# Patient Record
Sex: Male | Born: 1945
Health system: Southern US, Community
[De-identification: ages and names within clinical notes are randomized; demographics above are authoritative.]

## PROBLEM LIST (undated history)

## (undated) DIAGNOSIS — E871 Hypo-osmolality and hyponatremia: Secondary | ICD-10-CM

## (undated) DIAGNOSIS — R51 Headache: Secondary | ICD-10-CM

## (undated) DIAGNOSIS — Z955 Presence of coronary angioplasty implant and graft: Secondary | ICD-10-CM

## (undated) DIAGNOSIS — K219 Gastro-esophageal reflux disease without esophagitis: Secondary | ICD-10-CM

## (undated) DIAGNOSIS — I341 Nonrheumatic mitral (valve) prolapse: Secondary | ICD-10-CM

## (undated) DIAGNOSIS — J309 Allergic rhinitis, unspecified: Secondary | ICD-10-CM

## (undated) DIAGNOSIS — E119 Type 2 diabetes mellitus without complications: Secondary | ICD-10-CM

## (undated) DIAGNOSIS — E785 Hyperlipidemia, unspecified: Secondary | ICD-10-CM

## (undated) DIAGNOSIS — D696 Thrombocytopenia, unspecified: Secondary | ICD-10-CM

## (undated) DIAGNOSIS — K625 Hemorrhage of anus and rectum: Secondary | ICD-10-CM

## (undated) DIAGNOSIS — K649 Unspecified hemorrhoids: Secondary | ICD-10-CM

## (undated) DIAGNOSIS — Z85048 Personal history of other malignant neoplasm of rectum, rectosigmoid junction, and anus: Secondary | ICD-10-CM

## (undated) DIAGNOSIS — C801 Malignant (primary) neoplasm, unspecified: Secondary | ICD-10-CM

## (undated) DIAGNOSIS — I10 Essential (primary) hypertension: Secondary | ICD-10-CM

## (undated) DIAGNOSIS — R519 Headache, unspecified: Secondary | ICD-10-CM

## (undated) HISTORY — DX: Personal history of other malignant neoplasm of rectum, rectosigmoid junction, and anus: Z85.048

## (undated) HISTORY — DX: Hypo-osmolality and hyponatremia: E87.1

## (undated) HISTORY — DX: Thrombocytopenia, unspecified: D69.6

## (undated) HISTORY — DX: Hyperlipidemia, unspecified: E78.5

## (undated) HISTORY — DX: Unspecified hemorrhoids: K64.9

## (undated) HISTORY — DX: Presence of coronary angioplasty implant and graft: Z95.5

## (undated) HISTORY — PX: OTHER SURGICAL HISTORY: SHX169

## (undated) HISTORY — DX: Allergic rhinitis, unspecified: J30.9

## (undated) HISTORY — DX: Hemorrhage of anus and rectum: K62.5

## (undated) NOTE — *Deleted (*Deleted)
PROGRESS NOTE    Dakota Bennett  JWJ:[id number] DOB: [birth year] DOA: 10/14/2020 PCP: Buckner Malta, MD   Brief Narrative: 17 year old with past medical history significant for rectal cancer with lung mets, hypertension, hyperlipidemia, diabetes type 2 GERD, hemorrhoids, mitral valve prolapse, CAD with a stent on Plavix, chronic diastolic heart failure, chronic thrombocytopenia who presents to the ED complaining of bright red blood per rectum that started the morning of admission.  Patient noticed large amount of bright red blood with dark blood clot mixed and.  He had 5 episode of rectal bleeding throughout the day which prompted him to the ED.  Reports mild dizziness.  Patient admitted with GI bleed, GI has been consulted.  Overnight he continued to have bloody bowel movement, hemoglobin dropped to 6.  He is receiving 2 units of packed red blood cell. transfer to progressive care unit    Assessment & Plan:   Principal Problem:   Rectal bleeding Active Problems:   History of rectal cancer   Diabetes mellitus type 2, noninsulin dependent (HCC)   Hyperlipidemia LDL goal <70   Acute on chronic blood loss anemia   Acute lower GI bleeding  1-Acute lower GI bleed, rectal bleeding: -Patient hemoglobin decreased to 6, presented with bloody stool.  -Received  2 units of packed red blood cell 11/24. -Continue to hold Plavix -plan for sigmoidoscopy this am.  -hb stable at 9.   2-Metastatic rectal cancer with mets to colon 2018 -Follows in [place]. -CT abdomen and pelvis 08/2020 mild progression of pulmonary mets, new left mediastinal lymph node enlargement of the left lower lung nodule.  No recurrence of metastatic cancer in abdomen and pelvis Need to follow-up with primary oncologist  3-Diabetes type II: Hemoglobin A1c 8.7 Continue with a sliding scale insulin  4-Hypertension: Holding blood pressure medication in the setting of GI bleed. As needed labetalol ordered   5-Thrombocytopenia: Chronic, remain stable.   6-CAD status post stenting: Holding Plavix due to GI bleed.  7-Chronic diastolic heart failure: Holding Lasix due to GI bleed.        Estimated body mass index is 25.56 kg/m as calculated from the following:   Height as of this encounter: 5\' 10"  (1.778 m).   Weight as of this encounter: 80.8 kg.   DVT prophylaxis: SCDs Code Status: Full code Family Communication: Care discussed with patient and wife who was at bedside.  Disposition Plan:  Status is: Inpatient  Remains inpatient appropriate because:Hemodynamically unstable   Dispo: The patient is from: Home              Anticipated d/c is to: Home              Anticipated d/c date is: 2 days              Patient currently is not medically stable to d/c.        Consultants:   GI  Procedures:   None  Antimicrobials:  None  Subjective: No more bloody bowel movement since yesterday morning.  Denies abdominal pain.   Objective: Vitals:   10/17/20 0847 10/17/20 0940 10/17/20 0949 10/17/20 0959  BP: (!) 178/63 (!) 129/53 (!) 149/58 (!) 161/56  Pulse:  63 68 64  Resp:  13 11 12   Temp:  97.8 F (36.6 C)    TempSrc:  Axillary    SpO2:  96% 97% 97%  Weight:      Height:        Intake/Output Summary (Last 24 hours)  at 10/17/2020 1007 Last data filed at 10/17/2020 0935 Gross per 24 hour  Intake 1365 ml  Output -  Net 1365 ml   Filed Weights   10/14/20 1920 10/17/20 0652  Weight: 81.6 kg 80.8 kg    Examination:  General exam: NAD Respiratory system: CTA Cardiovascular system: S 1, S 2 RRR Gastrointestinal system: BS present, soft, nt Central nervous system: Alert Extremities: symmetric power.     Data Reviewed: I have personally reviewed following labs and imaging studies  CBC: Recent Labs  Lab 10/14/20 2000 10/14/20 2000 10/15/20 0226 10/15/20 0226 10/15/20 0554 10/15/20 0554 10/15/20 1228 10/16/20 0315 10/16/20 1225 10/16/20 2217  10/17/20 0706  WBC 7.1   < > 7.0  --  7.1  --  5.2 5.7  --   --  5.4  NEUTROABS 5.2  --   --   --   --   --   --   --   --   --   --   HGB 8.5*   < > 8.5*   < > 8.5*   < > 8.0* 6.8* 8.5* 8.9* 9.0*  HCT 28.9*   < > 28.1*   < > 27.6*   < > 25.4* 22.4* 27.3* 27.7* 28.5*  MCV 82.8   < > 80.7  --  79.1*  --  80.1 81.5  --   --  81.4  PLT 105*   < > 100*  --  100*  --  91* 88*  --   --  98*   < > = values in this interval not displayed.   Basic Metabolic Panel: Recent Labs  Lab 10/14/20 2000 10/15/20 1228 10/16/20 0315 10/17/20 0706  NA 138 138 137 139  K 3.5 3.7 3.5 3.6  CL 106 104 103 107  CO2 23 23 22 24   GLUCOSE 226* 259* 178* 142*  BUN 9 10 10  6*  CREATININE 0.77 0.83 0.82 0.80  CALCIUM 7.5* 8.2* 8.0* 8.3*  MG  --   --  1.7  --    GFR: Estimated Creatinine Clearance: 86.2 mL/min (by C-G formula based on SCr of 0.8 mg/dL). Liver Function Tests: Recent Labs  Lab 10/14/20 2000  AST 35  ALT 41  ALKPHOS 122  BILITOT 0.6  PROT 5.3*  ALBUMIN 2.8*   No results for input(s): LIPASE, AMYLASE in the last 168 hours. No results for input(s): AMMONIA in the last 168 hours. Coagulation Profile: Recent Labs  Lab 10/14/20 2000 10/15/20 1228  INR 1.7* 1.2   Cardiac Enzymes: No results for input(s): CKTOTAL, CKMB, CKMBINDEX, TROPONINI in the last 168 hours. BNP (last 3 results) No results for input(s): PROBNP in the last 8760 hours. HbA1C: Recent Labs    10/15/20 0226  HGBA1C 8.7*   CBG: Recent Labs  Lab 10/16/20 1204 10/16/20 1622 10/16/20 2050 10/17/20 0132 10/17/20 0740  GLUCAP 222* 104* 107* 98 135*   Lipid Profile: No results for input(s): CHOL, HDL, LDLCALC, TRIG, CHOLHDL, LDLDIRECT in the last 72 hours. Thyroid Function Tests: No results for input(s): TSH, T4TOTAL, FREET4, T3FREE, THYROIDAB in the last 72 hours. Anemia Panel: No results for input(s): VITAMINB12, FOLATE, FERRITIN, TIBC, IRON, RETICCTPCT in the last 72 hours. Sepsis Labs: No results for  input(s): PROCALCITON, LATICACIDVEN in the last 168 hours.  Recent Results (from the past 240 hour(s))  Respiratory Panel by RT PCR (Flu A&B, Covid) - Nasopharyngeal Swab     Status: None   Collection Time: 10/15/20  3:14 PM  Specimen: Nasopharyngeal Swab; Nasopharyngeal(NP) swabs in vial transport medium  Result Value Ref Range Status   SARS Coronavirus 2 by RT PCR NEGATIVE NEGATIVE Final    Comment: (NOTE) SARS-CoV-2 target nucleic acids are NOT DETECTED.  The SARS-CoV-2 RNA is generally detectable in upper respiratoy specimens during the acute phase of infection. The lowest concentration of SARS-CoV-2 viral copies this assay can detect is 131 copies/mL. A negative result does not preclude SARS-Cov-2 infection and should not be used as the sole basis for treatment or other patient management decisions. A negative result may occur with  improper specimen collection/handling, submission of specimen other than nasopharyngeal swab, presence of viral mutation(s) within the areas targeted by this assay, and inadequate number of viral copies (<131 copies/mL). A negative result must be combined with clinical observations, patient history, and epidemiological information. The expected result is Negative.  Fact Sheet for Patients:  https://www.moore.com/  Fact Sheet for Healthcare Providers:  https://www.young.biz/  This test is no t yet approved or cleared by the [place] FDA and  has been authorized for detection and/or diagnosis of SARS-CoV-2 by FDA under an Emergency Use Authorization (EUA). This EUA will remain  in effect (meaning this test can be used) for the duration of the COVID-19 declaration under Section 564(b)(1) of the Act, 21 U.S.C. section 360bbb-3(b)(1), unless the authorization is terminated or revoked sooner.     Influenza A by PCR NEGATIVE NEGATIVE Final   Influenza B by PCR NEGATIVE NEGATIVE Final    Comment: (NOTE)  The Xpert Xpress SARS-CoV-2/FLU/RSV assay is intended as an aid in  the diagnosis of influenza from Nasopharyngeal swab specimens and  should not be used as a sole basis for treatment. Nasal washings and  aspirates are unacceptable for Xpert Xpress SARS-CoV-2/FLU/RSV  testing.  Fact Sheet for Patients: https://www.moore.com/  Fact Sheet for Healthcare Providers: https://www.young.biz/  This test is not yet approved or cleared by the [place] FDA and  has been authorized for detection and/or diagnosis of SARS-CoV-2 by  FDA under an Emergency Use Authorization (EUA). This EUA will remain  in effect (meaning this test can be used) for the duration of the  Covid-19 declaration under Section 564(b)(1) of the Act, 21  U.S.C. section 360bbb-3(b)(1), unless the authorization is  terminated or revoked. Performed at [facility], [address], [place], [place] [address]          Radiology Studies: No results found.      Scheduled Meds: . atorvastatin  80 mg Oral q1800  . Chlorhexidine Gluconate Cloth  6 each Topical Daily  . ezetimibe  10 mg Oral Daily  . gabapentin  300 mg Oral QHS  . hydrALAZINE  25 mg Oral Daily  . insulin aspart  0-9 Units Subcutaneous TID WC  . isosorbide mononitrate  30 mg Oral Daily  . [START ON 10/18/2020] metoprolol succinate  100 mg Oral Daily  . pantoprazole  40 mg Oral Q0600  . polyethylene glycol  17 g Oral Daily  . sodium chloride flush  10-40 mL Intracatheter Q12H   Continuous Infusions:   LOS: 2 days    Time spent: 35 minutes.     Alba Cory, MD [facility]   If 7PM-7AM, please contact night-coverage [email or web address]  10/17/2020, 10:07 AM

---

## 2014-03-08 DIAGNOSIS — I10 Essential (primary) hypertension: Secondary | ICD-10-CM | POA: Diagnosis not present

## 2014-03-08 DIAGNOSIS — E7800 Pure hypercholesterolemia, unspecified: Secondary | ICD-10-CM | POA: Diagnosis not present

## 2014-03-08 DIAGNOSIS — R42 Dizziness and giddiness: Secondary | ICD-10-CM | POA: Diagnosis not present

## 2014-03-08 DIAGNOSIS — Z91199 Patient's noncompliance with other medical treatment and regimen due to unspecified reason: Secondary | ICD-10-CM | POA: Diagnosis not present

## 2014-03-08 DIAGNOSIS — Z9119 Patient's noncompliance with other medical treatment and regimen: Secondary | ICD-10-CM | POA: Diagnosis not present

## 2014-03-16 DIAGNOSIS — I10 Essential (primary) hypertension: Secondary | ICD-10-CM | POA: Diagnosis not present

## 2014-03-30 DIAGNOSIS — I10 Essential (primary) hypertension: Secondary | ICD-10-CM | POA: Diagnosis not present

## 2014-03-30 DIAGNOSIS — E785 Hyperlipidemia, unspecified: Secondary | ICD-10-CM | POA: Diagnosis not present

## 2014-03-30 DIAGNOSIS — J309 Allergic rhinitis, unspecified: Secondary | ICD-10-CM | POA: Diagnosis not present

## 2014-05-04 DIAGNOSIS — J309 Allergic rhinitis, unspecified: Secondary | ICD-10-CM | POA: Diagnosis not present

## 2014-05-04 DIAGNOSIS — Z23 Encounter for immunization: Secondary | ICD-10-CM | POA: Diagnosis not present

## 2014-05-04 DIAGNOSIS — Z1211 Encounter for screening for malignant neoplasm of colon: Secondary | ICD-10-CM | POA: Diagnosis not present

## 2014-05-04 DIAGNOSIS — I10 Essential (primary) hypertension: Secondary | ICD-10-CM | POA: Diagnosis not present

## 2014-05-04 DIAGNOSIS — E785 Hyperlipidemia, unspecified: Secondary | ICD-10-CM | POA: Diagnosis not present

## 2014-05-10 DIAGNOSIS — Z1211 Encounter for screening for malignant neoplasm of colon: Secondary | ICD-10-CM | POA: Diagnosis not present

## 2014-05-23 DIAGNOSIS — Z0000 Encounter for general adult medical examination without abnormal findings: Secondary | ICD-10-CM | POA: Diagnosis not present

## 2014-05-24 DIAGNOSIS — M949 Disorder of cartilage, unspecified: Secondary | ICD-10-CM | POA: Diagnosis not present

## 2014-05-24 DIAGNOSIS — I10 Essential (primary) hypertension: Secondary | ICD-10-CM | POA: Diagnosis not present

## 2014-05-24 DIAGNOSIS — Z125 Encounter for screening for malignant neoplasm of prostate: Secondary | ICD-10-CM | POA: Diagnosis not present

## 2014-05-24 DIAGNOSIS — E785 Hyperlipidemia, unspecified: Secondary | ICD-10-CM | POA: Diagnosis not present

## 2014-05-24 DIAGNOSIS — Z79899 Other long term (current) drug therapy: Secondary | ICD-10-CM | POA: Diagnosis not present

## 2014-05-24 DIAGNOSIS — M899 Disorder of bone, unspecified: Secondary | ICD-10-CM | POA: Diagnosis not present

## 2014-06-01 DIAGNOSIS — Z136 Encounter for screening for cardiovascular disorders: Secondary | ICD-10-CM | POA: Diagnosis not present

## 2014-07-10 DIAGNOSIS — L989 Disorder of the skin and subcutaneous tissue, unspecified: Secondary | ICD-10-CM | POA: Diagnosis not present

## 2014-07-10 DIAGNOSIS — L259 Unspecified contact dermatitis, unspecified cause: Secondary | ICD-10-CM | POA: Diagnosis not present

## 2014-07-10 DIAGNOSIS — T148 Other injury of unspecified body region: Secondary | ICD-10-CM | POA: Diagnosis not present

## 2014-10-08 DIAGNOSIS — Z79899 Other long term (current) drug therapy: Secondary | ICD-10-CM | POA: Diagnosis not present

## 2014-10-08 DIAGNOSIS — K921 Melena: Secondary | ICD-10-CM | POA: Diagnosis not present

## 2014-10-08 DIAGNOSIS — J302 Other seasonal allergic rhinitis: Secondary | ICD-10-CM | POA: Diagnosis not present

## 2014-10-08 DIAGNOSIS — E780 Pure hypercholesterolemia: Secondary | ICD-10-CM | POA: Diagnosis not present

## 2014-10-08 DIAGNOSIS — I10 Essential (primary) hypertension: Secondary | ICD-10-CM | POA: Diagnosis not present

## 2015-02-04 DIAGNOSIS — E1165 Type 2 diabetes mellitus with hyperglycemia: Secondary | ICD-10-CM | POA: Diagnosis not present

## 2015-02-04 DIAGNOSIS — I10 Essential (primary) hypertension: Secondary | ICD-10-CM | POA: Diagnosis not present

## 2015-02-04 DIAGNOSIS — E780 Pure hypercholesterolemia: Secondary | ICD-10-CM | POA: Diagnosis not present

## 2015-02-04 DIAGNOSIS — Z1389 Encounter for screening for other disorder: Secondary | ICD-10-CM | POA: Diagnosis not present

## 2015-11-04 DIAGNOSIS — J301 Allergic rhinitis due to pollen: Secondary | ICD-10-CM | POA: Diagnosis not present

## 2015-11-04 DIAGNOSIS — E1165 Type 2 diabetes mellitus with hyperglycemia: Secondary | ICD-10-CM | POA: Diagnosis not present

## 2015-11-04 DIAGNOSIS — Z23 Encounter for immunization: Secondary | ICD-10-CM | POA: Diagnosis not present

## 2015-11-04 DIAGNOSIS — I10 Essential (primary) hypertension: Secondary | ICD-10-CM | POA: Diagnosis not present

## 2015-11-04 DIAGNOSIS — Z79899 Other long term (current) drug therapy: Secondary | ICD-10-CM | POA: Diagnosis not present

## 2015-11-04 DIAGNOSIS — E785 Hyperlipidemia, unspecified: Secondary | ICD-10-CM | POA: Diagnosis not present

## 2015-11-04 DIAGNOSIS — Z0000 Encounter for general adult medical examination without abnormal findings: Secondary | ICD-10-CM | POA: Diagnosis not present

## 2016-06-05 DIAGNOSIS — E785 Hyperlipidemia, unspecified: Secondary | ICD-10-CM | POA: Diagnosis not present

## 2016-06-05 DIAGNOSIS — Z79899 Other long term (current) drug therapy: Secondary | ICD-10-CM | POA: Diagnosis not present

## 2016-06-05 DIAGNOSIS — J309 Allergic rhinitis, unspecified: Secondary | ICD-10-CM | POA: Diagnosis not present

## 2016-06-05 DIAGNOSIS — E1165 Type 2 diabetes mellitus with hyperglycemia: Secondary | ICD-10-CM | POA: Diagnosis not present

## 2016-06-05 DIAGNOSIS — R0982 Postnasal drip: Secondary | ICD-10-CM | POA: Diagnosis not present

## 2016-06-05 DIAGNOSIS — I10 Essential (primary) hypertension: Secondary | ICD-10-CM | POA: Diagnosis not present

## 2016-12-07 DIAGNOSIS — R0982 Postnasal drip: Secondary | ICD-10-CM | POA: Diagnosis not present

## 2016-12-07 DIAGNOSIS — E785 Hyperlipidemia, unspecified: Secondary | ICD-10-CM | POA: Diagnosis not present

## 2016-12-07 DIAGNOSIS — Z23 Encounter for immunization: Secondary | ICD-10-CM | POA: Diagnosis not present

## 2016-12-07 DIAGNOSIS — Z125 Encounter for screening for malignant neoplasm of prostate: Secondary | ICD-10-CM | POA: Diagnosis not present

## 2016-12-07 DIAGNOSIS — Z0000 Encounter for general adult medical examination without abnormal findings: Secondary | ICD-10-CM | POA: Diagnosis not present

## 2016-12-07 DIAGNOSIS — Z79899 Other long term (current) drug therapy: Secondary | ICD-10-CM | POA: Diagnosis not present

## 2016-12-07 DIAGNOSIS — J309 Allergic rhinitis, unspecified: Secondary | ICD-10-CM | POA: Diagnosis not present

## 2016-12-07 DIAGNOSIS — E1165 Type 2 diabetes mellitus with hyperglycemia: Secondary | ICD-10-CM | POA: Diagnosis not present

## 2016-12-27 DIAGNOSIS — Z1211 Encounter for screening for malignant neoplasm of colon: Secondary | ICD-10-CM | POA: Diagnosis not present

## 2016-12-27 DIAGNOSIS — Z1212 Encounter for screening for malignant neoplasm of rectum: Secondary | ICD-10-CM | POA: Diagnosis not present

## 2017-02-18 DIAGNOSIS — R195 Other fecal abnormalities: Secondary | ICD-10-CM | POA: Diagnosis not present

## 2017-03-03 DIAGNOSIS — Z7984 Long term (current) use of oral hypoglycemic drugs: Secondary | ICD-10-CM | POA: Diagnosis not present

## 2017-03-03 DIAGNOSIS — Z79899 Other long term (current) drug therapy: Secondary | ICD-10-CM | POA: Diagnosis not present

## 2017-03-03 DIAGNOSIS — C20 Malignant neoplasm of rectum: Secondary | ICD-10-CM | POA: Diagnosis not present

## 2017-03-03 DIAGNOSIS — Z87891 Personal history of nicotine dependence: Secondary | ICD-10-CM | POA: Diagnosis not present

## 2017-03-03 DIAGNOSIS — E785 Hyperlipidemia, unspecified: Secondary | ICD-10-CM | POA: Diagnosis not present

## 2017-03-03 DIAGNOSIS — I10 Essential (primary) hypertension: Secondary | ICD-10-CM | POA: Diagnosis not present

## 2017-03-03 DIAGNOSIS — D123 Benign neoplasm of transverse colon: Secondary | ICD-10-CM | POA: Diagnosis not present

## 2017-03-03 DIAGNOSIS — E119 Type 2 diabetes mellitus without complications: Secondary | ICD-10-CM | POA: Diagnosis not present

## 2017-03-03 DIAGNOSIS — K635 Polyp of colon: Secondary | ICD-10-CM | POA: Diagnosis not present

## 2017-03-03 DIAGNOSIS — R195 Other fecal abnormalities: Secondary | ICD-10-CM | POA: Diagnosis not present

## 2017-03-03 DIAGNOSIS — K5730 Diverticulosis of large intestine without perforation or abscess without bleeding: Secondary | ICD-10-CM | POA: Diagnosis not present

## 2017-03-03 DIAGNOSIS — J309 Allergic rhinitis, unspecified: Secondary | ICD-10-CM | POA: Diagnosis not present

## 2017-03-03 HISTORY — PX: COLONOSCOPY: SHX5424

## 2017-03-08 DIAGNOSIS — R918 Other nonspecific abnormal finding of lung field: Secondary | ICD-10-CM | POA: Diagnosis not present

## 2017-03-08 DIAGNOSIS — C20 Malignant neoplasm of rectum: Secondary | ICD-10-CM | POA: Diagnosis not present

## 2017-03-10 DIAGNOSIS — I10 Essential (primary) hypertension: Secondary | ICD-10-CM | POA: Diagnosis not present

## 2017-03-10 DIAGNOSIS — E119 Type 2 diabetes mellitus without complications: Secondary | ICD-10-CM | POA: Diagnosis not present

## 2017-03-10 DIAGNOSIS — C20 Malignant neoplasm of rectum: Secondary | ICD-10-CM | POA: Diagnosis not present

## 2017-03-11 ENCOUNTER — Telehealth: Payer: Self-pay | Admitting: Gastroenterology

## 2017-03-11 NOTE — Telephone Encounter (Signed)
Received referral from [place] office for patient to have EUS with Dr.Jacobs. Referral placed on Patty's desk.

## 2017-03-11 NOTE — Telephone Encounter (Signed)
Dr Jacobs the records are on your desk for review.  

## 2017-03-15 NOTE — Telephone Encounter (Signed)
Done, in my out box now.

## 2017-03-16 ENCOUNTER — Other Ambulatory Visit: Payer: Self-pay

## 2017-03-16 ENCOUNTER — Telehealth: Payer: Self-pay

## 2017-03-16 DIAGNOSIS — C20 Malignant neoplasm of rectum: Secondary | ICD-10-CM

## 2017-03-16 NOTE — Telephone Encounter (Signed)
Pt has been scheduled for EUS on 03/18/17 EUS scheduled, pt instructed and medications reviewed.  Patient instructions mailed to home.  Patient to call with any questions or concerns.

## 2017-03-17 ENCOUNTER — Encounter (HOSPITAL_COMMUNITY): Payer: Self-pay | Admitting: Certified Registered Nurse Anesthetist

## 2017-03-17 ENCOUNTER — Encounter (HOSPITAL_COMMUNITY): Payer: Self-pay | Admitting: *Deleted

## 2017-03-17 DIAGNOSIS — K760 Fatty (change of) liver, not elsewhere classified: Secondary | ICD-10-CM | POA: Diagnosis not present

## 2017-03-17 DIAGNOSIS — I700 Atherosclerosis of aorta: Secondary | ICD-10-CM | POA: Diagnosis not present

## 2017-03-17 DIAGNOSIS — I2510 Atherosclerotic heart disease of native coronary artery without angina pectoris: Secondary | ICD-10-CM | POA: Diagnosis not present

## 2017-03-17 DIAGNOSIS — R918 Other nonspecific abnormal finding of lung field: Secondary | ICD-10-CM | POA: Diagnosis not present

## 2017-03-17 DIAGNOSIS — C20 Malignant neoplasm of rectum: Secondary | ICD-10-CM | POA: Diagnosis not present

## 2017-03-17 DIAGNOSIS — J439 Emphysema, unspecified: Secondary | ICD-10-CM | POA: Diagnosis not present

## 2017-03-18 ENCOUNTER — Ambulatory Visit (HOSPITAL_COMMUNITY)
Admission: RE | Admit: 2017-03-18 | Discharge: 2017-03-18 | Disposition: A | Discharge status: Home / Self Care | Payer: Medicare Other | Source: Ambulatory Visit | Attending: Gastroenterology | Admitting: Gastroenterology

## 2017-03-18 ENCOUNTER — Encounter (HOSPITAL_COMMUNITY)
Admission: RE | Disposition: A | Discharge status: Home / Self Care | Payer: Self-pay | Source: Ambulatory Visit | Attending: Gastroenterology

## 2017-03-18 ENCOUNTER — Telehealth: Payer: Self-pay

## 2017-03-18 ENCOUNTER — Encounter (HOSPITAL_COMMUNITY): Payer: Self-pay

## 2017-03-18 DIAGNOSIS — Z87891 Personal history of nicotine dependence: Secondary | ICD-10-CM | POA: Insufficient documentation

## 2017-03-18 DIAGNOSIS — E119 Type 2 diabetes mellitus without complications: Secondary | ICD-10-CM | POA: Insufficient documentation

## 2017-03-18 DIAGNOSIS — C20 Malignant neoplasm of rectum: Secondary | ICD-10-CM

## 2017-03-18 DIAGNOSIS — Z886 Allergy status to analgesic agent status: Secondary | ICD-10-CM | POA: Insufficient documentation

## 2017-03-18 DIAGNOSIS — Z91048 Other nonmedicinal substance allergy status: Secondary | ICD-10-CM | POA: Insufficient documentation

## 2017-03-18 DIAGNOSIS — Z881 Allergy status to other antibiotic agents status: Secondary | ICD-10-CM | POA: Insufficient documentation

## 2017-03-18 DIAGNOSIS — I10 Essential (primary) hypertension: Secondary | ICD-10-CM | POA: Insufficient documentation

## 2017-03-18 DIAGNOSIS — K219 Gastro-esophageal reflux disease without esophagitis: Secondary | ICD-10-CM | POA: Insufficient documentation

## 2017-03-18 HISTORY — PX: EUS: SHX5427

## 2017-03-18 HISTORY — DX: Malignant (primary) neoplasm, unspecified: C80.1

## 2017-03-18 HISTORY — DX: Essential (primary) hypertension: I10

## 2017-03-18 HISTORY — DX: Headache: R51

## 2017-03-18 HISTORY — DX: Nonrheumatic mitral (valve) prolapse: I34.1

## 2017-03-18 HISTORY — DX: Gastro-esophageal reflux disease without esophagitis: K21.9

## 2017-03-18 HISTORY — DX: Type 2 diabetes mellitus without complications: E11.9

## 2017-03-18 HISTORY — DX: Headache, unspecified: R51.9

## 2017-03-18 LAB — GLUCOSE, CAPILLARY: GLUCOSE-CAPILLARY: 112 mg/dL — AB (ref 65–99)

## 2017-03-18 SURGERY — ULTRASOUND, LOWER GI TRACT, ENDOSCOPIC
Anesthesia: Moderate Sedation

## 2017-03-18 MED ORDER — MIDAZOLAM HCL 5 MG/ML IJ SOLN
INTRAMUSCULAR | Status: AC
  Filled 2017-03-18: qty 2

## 2017-03-18 MED ORDER — FENTANYL CITRATE (PF) 100 MCG/2ML IJ SOLN
INTRAMUSCULAR | Status: AC
  Filled 2017-03-18: qty 2

## 2017-03-18 MED ORDER — SODIUM CHLORIDE 0.9 % IV SOLN: INTRAVENOUS | Status: DC

## 2017-03-18 NOTE — H&P (Signed)
HPI: This is a 71 yo man  Chief complaint is rectal cancer  Recently diagnosed with non-metastatic rectal adenocaricnoma (Dr. Lyndel Safe colonoscopy). Sent by Dr. Kendell Bane from surgery for EUS staging  ROS: complete GI ROS as described in HPI.  Constitutional:  No unintentional weight loss   Past Medical History:  Diagnosis Date  . Cancer [facility])    rectal cancer  . Diabetes mellitus without complication ([place])    type 2  . GERD (gastroesophageal reflux disease)   . Headache    hx of migraines yrs ago  . Hypertension   . MVP (mitral valve prolapse)    dx 42 yrs no cardiologist    Past Surgical History:  Procedure Laterality Date  . colonscopy  03/03/2017  . extensive dental work    . ingrown toenail removed  age 56 or 13    Current Facility-Administered Medications  Medication Dose Route Frequency Provider Last Rate Last Dose  . 0.9 %  sodium chloride infusion   Intravenous Continuous Milus Banister, MD        Allergies as of 03/16/2017 - Review Complete 03/16/2017  Allergen Reaction Noted  . Aspirin Other (See Comments) 03/16/2017  . Adhesive [tape] Rash 03/16/2017  . Neosporin [neomycin-bacitracin zn-polymyx] Rash 03/16/2017    History reviewed. No pertinent family history.  Social History   Social History  . Marital status: Married    Spouse name: N/A  . Number of children: N/A  . Years of education: N/A   Occupational History  . Not on file.   Social History Main Topics  . Smoking status: Former Smoker    Packs/day: 2.00    Years: 20.00    Types: Cigarettes  . Smokeless tobacco: Former Systems developer    Quit date: 11/23/1998     Comment: quit 2000  . Alcohol use No  . Drug use: No  . Sexual activity: Not on file   Other Topics Concern  . Not on file   Social History Narrative  . No narrative on file     Physical Exam: BP (!) 183/81   Pulse 60   Temp 98 F (36.7 C) (Oral)   Resp 13   SpO2 97%  Constitutional: generally  well-appearing Psychiatric: alert and oriented x3 Abdomen: soft, nontender, nondistended, no obvious ascites, no peritoneal signs, normal bowel sounds No peripheral edema noted in lower extremities  Assessment and plan: 71 y.o. male with rectal adenocarcinoma  For lower EUS staging today  Please see the "Patient Instructions" section for addition details about the plan.  Owens Loffler, MD [facility] 03/18/2017, 1:05 PM

## 2017-03-18 NOTE — Discharge Instructions (Signed)

## 2017-03-18 NOTE — Telephone Encounter (Signed)
-----   Message from Milus Banister, MD sent at 03/18/2017  2:39 PM EDT ----- Marykay Lex, Can you please send copies of todays EUS to Dr Lyndel Safe (gastroenterology in [place]) and also Dr. Kendell Bane ([facility]).   Thanks  dj

## 2017-03-18 NOTE — Op Note (Signed)
[facility] Patient Name: Dakota Bennett Procedure Date: 03/18/2017 MRN: [id number] Attending MD: Milus Banister , MD Date of Birth: February 09, 1946 CSN: [id number] Age: 71 Admit Type: Outpatient Procedure:                Lower EUS Indications:              Recently diagnosed rectal adenocarcinoma; Dr. Lyndel Safe                            colonoscopy; CT scans show no sign of metastatic                            disease. Providers:                Milus Banister, MD, Elmer Ramp. Tilden Dome, RN, Cherylynn Ridges, Technician, Tinnie Gens, Technician Referring MD:             Kendell Bane, MD at [facility] Medicines:                None Complications:            No immediate complications. Estimated blood loss:                            None. Estimated Blood Loss:     Estimated blood loss: none. Procedure:                Pre-Anesthesia Assessment:                           - Prior to the procedure, a History and Physical                            was performed, and patient medications and                            allergies were reviewed. The patient's tolerance of                            previous anesthesia was also reviewed. The risks                            and benefits of the procedure and the sedation                            options and risks were discussed with the patient.                            All questions were answered, and informed consent                            was obtained. Prior Anticoagulants: The patient has  taken no previous anticoagulant or antiplatelet                            agents. ASA Grade Assessment: II - A patient with                            mild systemic disease. After reviewing the risks                            and benefits, the patient was deemed in                            satisfactory condition to undergo the procedure.                           After  obtaining informed consent, the endoscope was                            passed under direct vision. Throughout the                            procedure, the patient's blood pressure, pulse, and                            oxygen saturations were monitored continuously. The                            [id number] ([id number]) scope was introduced through                            the anus and advanced to the the sigmoid colon for                            ultrasound. The lower EUS was accomplished without                            difficulty. The patient tolerated the procedure                            well. The quality of the bowel preparation was good. Findings:      Endoscopic Finding :      1. A clearly malignant non-obstructing medium-sized mass was found       proximal rectum. The mass was partially circumferential (involving       two-thirds of the lumen circumference) and was 3cm long. The distal edge       of the mass was 10cm from the anal verge. The proximal edge of the mass       has previous [place] Ink tatoo present.      Endosonographic Finding :      1. The mass above correlates with a heterogeneous, hypoechoic lesion       that clearly passes into and through the muscularis propria wall (uT3).      2. There were two round, suspicious perirectal lymphnodes that measure 5  and 97mm (uN1). Impression:               - 3cm long, non-circumferential, non-obstructing                            uT3N1 adenocarcinoma with distal edge at 10cm from                            the anal verge. Moderate Sedation:      None Recommendation:           - Discharge patient to home (ambulatory).                           - He will likely benefit from neoadjuvant chemo/XRT. Procedure Code(s):        --- Professional ---                           [phone], Sigmoidoscopy, flexible; with endoscopic                            ultrasound examination Diagnosis Code(s):        --- Professional ---                            C18.7, Malignant neoplasm of sigmoid colon                           K62.89, Other specified diseases of anus and rectum                           C21.8, Malignant neoplasm of overlapping sites of                            rectum, anus and anal canal CPT copyright 2016 American Medical Association. All rights reserved. The codes documented in this report are preliminary and upon coder review may  be revised to meet current compliance requirements. Milus Banister, MD 03/18/2017 2:38:13 PM This report has been signed electronically. Number of Addenda: 0

## 2017-03-18 NOTE — Telephone Encounter (Signed)
EUS faxed as requested

## 2017-03-22 ENCOUNTER — Encounter (HOSPITAL_COMMUNITY): Payer: Self-pay | Admitting: Gastroenterology

## 2017-03-26 DIAGNOSIS — I10 Essential (primary) hypertension: Secondary | ICD-10-CM | POA: Diagnosis not present

## 2017-03-26 DIAGNOSIS — C218 Malignant neoplasm of overlapping sites of rectum, anus and anal canal: Secondary | ICD-10-CM | POA: Diagnosis not present

## 2017-03-26 DIAGNOSIS — R918 Other nonspecific abnormal finding of lung field: Secondary | ICD-10-CM | POA: Diagnosis not present

## 2017-03-26 DIAGNOSIS — J309 Allergic rhinitis, unspecified: Secondary | ICD-10-CM | POA: Diagnosis not present

## 2017-03-26 DIAGNOSIS — E785 Hyperlipidemia, unspecified: Secondary | ICD-10-CM | POA: Diagnosis not present

## 2017-03-26 DIAGNOSIS — E119 Type 2 diabetes mellitus without complications: Secondary | ICD-10-CM | POA: Diagnosis not present

## 2017-03-26 DIAGNOSIS — Z87891 Personal history of nicotine dependence: Secondary | ICD-10-CM | POA: Diagnosis not present

## 2017-03-26 DIAGNOSIS — I341 Nonrheumatic mitral (valve) prolapse: Secondary | ICD-10-CM | POA: Diagnosis not present

## 2017-03-26 DIAGNOSIS — C20 Malignant neoplasm of rectum: Secondary | ICD-10-CM | POA: Diagnosis not present

## 2017-03-29 DIAGNOSIS — C20 Malignant neoplasm of rectum: Secondary | ICD-10-CM

## 2017-03-29 HISTORY — DX: Malignant neoplasm of rectum: C20

## 2017-03-30 DIAGNOSIS — I999 Unspecified disorder of circulatory system: Secondary | ICD-10-CM | POA: Diagnosis not present

## 2017-03-30 DIAGNOSIS — C20 Malignant neoplasm of rectum: Secondary | ICD-10-CM | POA: Diagnosis not present

## 2017-03-30 DIAGNOSIS — Z87891 Personal history of nicotine dependence: Secondary | ICD-10-CM | POA: Diagnosis not present

## 2017-03-30 DIAGNOSIS — Z800 Family history of malignant neoplasm of digestive organs: Secondary | ICD-10-CM | POA: Diagnosis not present

## 2017-03-30 DIAGNOSIS — R9431 Abnormal electrocardiogram [ECG] [EKG]: Secondary | ICD-10-CM | POA: Diagnosis not present

## 2017-03-30 DIAGNOSIS — Z7984 Long term (current) use of oral hypoglycemic drugs: Secondary | ICD-10-CM | POA: Diagnosis not present

## 2017-03-30 DIAGNOSIS — E119 Type 2 diabetes mellitus without complications: Secondary | ICD-10-CM | POA: Diagnosis not present

## 2017-03-30 DIAGNOSIS — E785 Hyperlipidemia, unspecified: Secondary | ICD-10-CM | POA: Diagnosis not present

## 2017-03-30 DIAGNOSIS — I872 Venous insufficiency (chronic) (peripheral): Secondary | ICD-10-CM | POA: Diagnosis not present

## 2017-03-30 DIAGNOSIS — I10 Essential (primary) hypertension: Secondary | ICD-10-CM | POA: Diagnosis not present

## 2017-04-01 DIAGNOSIS — C20 Malignant neoplasm of rectum: Secondary | ICD-10-CM | POA: Diagnosis not present

## 2017-04-01 DIAGNOSIS — C218 Malignant neoplasm of overlapping sites of rectum, anus and anal canal: Secondary | ICD-10-CM | POA: Diagnosis not present

## 2017-04-01 DIAGNOSIS — E119 Type 2 diabetes mellitus without complications: Secondary | ICD-10-CM | POA: Diagnosis not present

## 2017-04-01 DIAGNOSIS — I10 Essential (primary) hypertension: Secondary | ICD-10-CM | POA: Diagnosis not present

## 2017-04-01 DIAGNOSIS — Z87891 Personal history of nicotine dependence: Secondary | ICD-10-CM | POA: Diagnosis not present

## 2017-04-05 DIAGNOSIS — Z510 Encounter for antineoplastic radiation therapy: Secondary | ICD-10-CM | POA: Diagnosis not present

## 2017-04-05 DIAGNOSIS — C218 Malignant neoplasm of overlapping sites of rectum, anus and anal canal: Secondary | ICD-10-CM | POA: Diagnosis not present

## 2017-04-05 DIAGNOSIS — C20 Malignant neoplasm of rectum: Secondary | ICD-10-CM | POA: Diagnosis not present

## 2017-04-07 DIAGNOSIS — C20 Malignant neoplasm of rectum: Secondary | ICD-10-CM | POA: Diagnosis not present

## 2017-04-07 DIAGNOSIS — Z510 Encounter for antineoplastic radiation therapy: Secondary | ICD-10-CM | POA: Diagnosis not present

## 2017-04-08 DIAGNOSIS — C218 Malignant neoplasm of overlapping sites of rectum, anus and anal canal: Secondary | ICD-10-CM | POA: Diagnosis not present

## 2017-04-09 DIAGNOSIS — C218 Malignant neoplasm of overlapping sites of rectum, anus and anal canal: Secondary | ICD-10-CM | POA: Diagnosis not present

## 2017-04-09 DIAGNOSIS — Z510 Encounter for antineoplastic radiation therapy: Secondary | ICD-10-CM | POA: Diagnosis not present

## 2017-04-09 DIAGNOSIS — C20 Malignant neoplasm of rectum: Secondary | ICD-10-CM | POA: Diagnosis not present

## 2017-04-12 DIAGNOSIS — Z510 Encounter for antineoplastic radiation therapy: Secondary | ICD-10-CM | POA: Diagnosis not present

## 2017-04-12 DIAGNOSIS — C218 Malignant neoplasm of overlapping sites of rectum, anus and anal canal: Secondary | ICD-10-CM | POA: Diagnosis not present

## 2017-04-12 DIAGNOSIS — C20 Malignant neoplasm of rectum: Secondary | ICD-10-CM | POA: Diagnosis not present

## 2017-04-13 DIAGNOSIS — C218 Malignant neoplasm of overlapping sites of rectum, anus and anal canal: Secondary | ICD-10-CM | POA: Diagnosis not present

## 2017-04-13 DIAGNOSIS — Z510 Encounter for antineoplastic radiation therapy: Secondary | ICD-10-CM | POA: Diagnosis not present

## 2017-04-13 DIAGNOSIS — C20 Malignant neoplasm of rectum: Secondary | ICD-10-CM | POA: Diagnosis not present

## 2017-04-14 DIAGNOSIS — C20 Malignant neoplasm of rectum: Secondary | ICD-10-CM | POA: Diagnosis not present

## 2017-04-14 DIAGNOSIS — Z510 Encounter for antineoplastic radiation therapy: Secondary | ICD-10-CM | POA: Diagnosis not present

## 2017-04-14 DIAGNOSIS — C218 Malignant neoplasm of overlapping sites of rectum, anus and anal canal: Secondary | ICD-10-CM | POA: Diagnosis not present

## 2017-04-15 DIAGNOSIS — Z510 Encounter for antineoplastic radiation therapy: Secondary | ICD-10-CM | POA: Diagnosis not present

## 2017-04-15 DIAGNOSIS — C20 Malignant neoplasm of rectum: Secondary | ICD-10-CM | POA: Diagnosis not present

## 2017-04-15 DIAGNOSIS — C218 Malignant neoplasm of overlapping sites of rectum, anus and anal canal: Secondary | ICD-10-CM | POA: Diagnosis not present

## 2017-04-16 DIAGNOSIS — C20 Malignant neoplasm of rectum: Secondary | ICD-10-CM | POA: Diagnosis not present

## 2017-04-16 DIAGNOSIS — C218 Malignant neoplasm of overlapping sites of rectum, anus and anal canal: Secondary | ICD-10-CM | POA: Diagnosis not present

## 2017-04-16 DIAGNOSIS — Z510 Encounter for antineoplastic radiation therapy: Secondary | ICD-10-CM | POA: Diagnosis not present

## 2017-04-19 DIAGNOSIS — C218 Malignant neoplasm of overlapping sites of rectum, anus and anal canal: Secondary | ICD-10-CM | POA: Diagnosis not present

## 2017-04-19 DIAGNOSIS — C20 Malignant neoplasm of rectum: Secondary | ICD-10-CM | POA: Diagnosis not present

## 2017-04-20 DIAGNOSIS — C218 Malignant neoplasm of overlapping sites of rectum, anus and anal canal: Secondary | ICD-10-CM | POA: Diagnosis not present

## 2017-04-21 DIAGNOSIS — C218 Malignant neoplasm of overlapping sites of rectum, anus and anal canal: Secondary | ICD-10-CM | POA: Diagnosis not present

## 2017-04-21 DIAGNOSIS — Z510 Encounter for antineoplastic radiation therapy: Secondary | ICD-10-CM | POA: Diagnosis not present

## 2017-04-21 DIAGNOSIS — C20 Malignant neoplasm of rectum: Secondary | ICD-10-CM | POA: Diagnosis not present

## 2017-04-22 DIAGNOSIS — C218 Malignant neoplasm of overlapping sites of rectum, anus and anal canal: Secondary | ICD-10-CM | POA: Diagnosis not present

## 2017-04-22 DIAGNOSIS — Z510 Encounter for antineoplastic radiation therapy: Secondary | ICD-10-CM | POA: Diagnosis not present

## 2017-04-23 DIAGNOSIS — Z510 Encounter for antineoplastic radiation therapy: Secondary | ICD-10-CM | POA: Diagnosis not present

## 2017-04-23 DIAGNOSIS — C218 Malignant neoplasm of overlapping sites of rectum, anus and anal canal: Secondary | ICD-10-CM | POA: Diagnosis not present

## 2017-04-26 DIAGNOSIS — E1165 Type 2 diabetes mellitus with hyperglycemia: Secondary | ICD-10-CM | POA: Diagnosis not present

## 2017-04-26 DIAGNOSIS — J309 Allergic rhinitis, unspecified: Secondary | ICD-10-CM | POA: Diagnosis not present

## 2017-04-26 DIAGNOSIS — C218 Malignant neoplasm of overlapping sites of rectum, anus and anal canal: Secondary | ICD-10-CM | POA: Diagnosis not present

## 2017-04-26 DIAGNOSIS — E7800 Pure hypercholesterolemia, unspecified: Secondary | ICD-10-CM | POA: Diagnosis not present

## 2017-04-26 DIAGNOSIS — I10 Essential (primary) hypertension: Secondary | ICD-10-CM | POA: Diagnosis not present

## 2017-04-27 DIAGNOSIS — Z510 Encounter for antineoplastic radiation therapy: Secondary | ICD-10-CM | POA: Diagnosis not present

## 2017-04-27 DIAGNOSIS — C218 Malignant neoplasm of overlapping sites of rectum, anus and anal canal: Secondary | ICD-10-CM | POA: Diagnosis not present

## 2017-04-28 DIAGNOSIS — Z510 Encounter for antineoplastic radiation therapy: Secondary | ICD-10-CM | POA: Diagnosis not present

## 2017-04-28 DIAGNOSIS — C218 Malignant neoplasm of overlapping sites of rectum, anus and anal canal: Secondary | ICD-10-CM | POA: Diagnosis not present

## 2017-04-29 DIAGNOSIS — Z510 Encounter for antineoplastic radiation therapy: Secondary | ICD-10-CM | POA: Diagnosis not present

## 2017-04-29 DIAGNOSIS — C218 Malignant neoplasm of overlapping sites of rectum, anus and anal canal: Secondary | ICD-10-CM | POA: Diagnosis not present

## 2017-04-30 DIAGNOSIS — Z510 Encounter for antineoplastic radiation therapy: Secondary | ICD-10-CM | POA: Diagnosis not present

## 2017-04-30 DIAGNOSIS — C218 Malignant neoplasm of overlapping sites of rectum, anus and anal canal: Secondary | ICD-10-CM | POA: Diagnosis not present

## 2017-05-03 DIAGNOSIS — Z510 Encounter for antineoplastic radiation therapy: Secondary | ICD-10-CM | POA: Diagnosis not present

## 2017-05-03 DIAGNOSIS — C218 Malignant neoplasm of overlapping sites of rectum, anus and anal canal: Secondary | ICD-10-CM | POA: Diagnosis not present

## 2017-05-04 DIAGNOSIS — C218 Malignant neoplasm of overlapping sites of rectum, anus and anal canal: Secondary | ICD-10-CM | POA: Diagnosis not present

## 2017-05-04 DIAGNOSIS — Z510 Encounter for antineoplastic radiation therapy: Secondary | ICD-10-CM | POA: Diagnosis not present

## 2017-05-05 DIAGNOSIS — Z510 Encounter for antineoplastic radiation therapy: Secondary | ICD-10-CM | POA: Diagnosis not present

## 2017-05-05 DIAGNOSIS — C218 Malignant neoplasm of overlapping sites of rectum, anus and anal canal: Secondary | ICD-10-CM | POA: Diagnosis not present

## 2017-05-06 DIAGNOSIS — Z510 Encounter for antineoplastic radiation therapy: Secondary | ICD-10-CM | POA: Diagnosis not present

## 2017-05-06 DIAGNOSIS — R300 Dysuria: Secondary | ICD-10-CM | POA: Diagnosis not present

## 2017-05-06 DIAGNOSIS — C218 Malignant neoplasm of overlapping sites of rectum, anus and anal canal: Secondary | ICD-10-CM | POA: Diagnosis not present

## 2017-05-07 DIAGNOSIS — C218 Malignant neoplasm of overlapping sites of rectum, anus and anal canal: Secondary | ICD-10-CM | POA: Diagnosis not present

## 2017-05-07 DIAGNOSIS — Z510 Encounter for antineoplastic radiation therapy: Secondary | ICD-10-CM | POA: Diagnosis not present

## 2017-05-10 DIAGNOSIS — C218 Malignant neoplasm of overlapping sites of rectum, anus and anal canal: Secondary | ICD-10-CM | POA: Diagnosis not present

## 2017-05-11 DIAGNOSIS — Z510 Encounter for antineoplastic radiation therapy: Secondary | ICD-10-CM | POA: Diagnosis not present

## 2017-05-11 DIAGNOSIS — C218 Malignant neoplasm of overlapping sites of rectum, anus and anal canal: Secondary | ICD-10-CM | POA: Diagnosis not present

## 2017-05-12 DIAGNOSIS — C218 Malignant neoplasm of overlapping sites of rectum, anus and anal canal: Secondary | ICD-10-CM | POA: Diagnosis not present

## 2017-05-12 DIAGNOSIS — Z510 Encounter for antineoplastic radiation therapy: Secondary | ICD-10-CM | POA: Diagnosis not present

## 2017-05-13 DIAGNOSIS — C218 Malignant neoplasm of overlapping sites of rectum, anus and anal canal: Secondary | ICD-10-CM | POA: Diagnosis not present

## 2017-05-14 DIAGNOSIS — Z510 Encounter for antineoplastic radiation therapy: Secondary | ICD-10-CM | POA: Diagnosis not present

## 2017-05-14 DIAGNOSIS — C218 Malignant neoplasm of overlapping sites of rectum, anus and anal canal: Secondary | ICD-10-CM | POA: Diagnosis not present

## 2017-05-17 DIAGNOSIS — Z510 Encounter for antineoplastic radiation therapy: Secondary | ICD-10-CM | POA: Diagnosis not present

## 2017-05-17 DIAGNOSIS — C218 Malignant neoplasm of overlapping sites of rectum, anus and anal canal: Secondary | ICD-10-CM | POA: Diagnosis not present

## 2017-05-18 DIAGNOSIS — C218 Malignant neoplasm of overlapping sites of rectum, anus and anal canal: Secondary | ICD-10-CM | POA: Diagnosis not present

## 2017-05-18 DIAGNOSIS — Z510 Encounter for antineoplastic radiation therapy: Secondary | ICD-10-CM | POA: Diagnosis not present

## 2017-05-19 DIAGNOSIS — Z510 Encounter for antineoplastic radiation therapy: Secondary | ICD-10-CM | POA: Diagnosis not present

## 2017-05-19 DIAGNOSIS — C218 Malignant neoplasm of overlapping sites of rectum, anus and anal canal: Secondary | ICD-10-CM | POA: Diagnosis not present

## 2017-06-14 DIAGNOSIS — C218 Malignant neoplasm of overlapping sites of rectum, anus and anal canal: Secondary | ICD-10-CM | POA: Diagnosis not present

## 2017-06-25 DIAGNOSIS — C20 Malignant neoplasm of rectum: Secondary | ICD-10-CM | POA: Diagnosis not present

## 2017-06-30 DIAGNOSIS — E119 Type 2 diabetes mellitus without complications: Secondary | ICD-10-CM | POA: Diagnosis not present

## 2017-06-30 DIAGNOSIS — C218 Malignant neoplasm of overlapping sites of rectum, anus and anal canal: Secondary | ICD-10-CM | POA: Diagnosis not present

## 2017-06-30 DIAGNOSIS — I10 Essential (primary) hypertension: Secondary | ICD-10-CM | POA: Diagnosis not present

## 2017-06-30 DIAGNOSIS — J439 Emphysema, unspecified: Secondary | ICD-10-CM | POA: Diagnosis not present

## 2017-06-30 DIAGNOSIS — K4090 Unilateral inguinal hernia, without obstruction or gangrene, not specified as recurrent: Secondary | ICD-10-CM | POA: Diagnosis not present

## 2017-06-30 DIAGNOSIS — I700 Atherosclerosis of aorta: Secondary | ICD-10-CM | POA: Diagnosis not present

## 2017-06-30 DIAGNOSIS — C20 Malignant neoplasm of rectum: Secondary | ICD-10-CM | POA: Diagnosis not present

## 2017-06-30 DIAGNOSIS — R918 Other nonspecific abnormal finding of lung field: Secondary | ICD-10-CM | POA: Diagnosis not present

## 2017-06-30 DIAGNOSIS — K5730 Diverticulosis of large intestine without perforation or abscess without bleeding: Secondary | ICD-10-CM | POA: Diagnosis not present

## 2017-07-02 DIAGNOSIS — R918 Other nonspecific abnormal finding of lung field: Secondary | ICD-10-CM | POA: Diagnosis not present

## 2017-07-02 DIAGNOSIS — C20 Malignant neoplasm of rectum: Secondary | ICD-10-CM | POA: Diagnosis not present

## 2017-07-08 DIAGNOSIS — C7800 Secondary malignant neoplasm of unspecified lung: Secondary | ICD-10-CM | POA: Diagnosis not present

## 2017-07-08 DIAGNOSIS — C218 Malignant neoplasm of overlapping sites of rectum, anus and anal canal: Secondary | ICD-10-CM | POA: Diagnosis not present

## 2017-07-08 DIAGNOSIS — C7802 Secondary malignant neoplasm of left lung: Secondary | ICD-10-CM | POA: Diagnosis not present

## 2017-07-08 DIAGNOSIS — C7801 Secondary malignant neoplasm of right lung: Secondary | ICD-10-CM | POA: Diagnosis not present

## 2017-07-12 DIAGNOSIS — C218 Malignant neoplasm of overlapping sites of rectum, anus and anal canal: Secondary | ICD-10-CM | POA: Diagnosis not present

## 2017-07-12 DIAGNOSIS — Z5111 Encounter for antineoplastic chemotherapy: Secondary | ICD-10-CM | POA: Diagnosis not present

## 2017-07-12 DIAGNOSIS — C7800 Secondary malignant neoplasm of unspecified lung: Secondary | ICD-10-CM | POA: Diagnosis not present

## 2017-07-14 DIAGNOSIS — C218 Malignant neoplasm of overlapping sites of rectum, anus and anal canal: Secondary | ICD-10-CM | POA: Diagnosis not present

## 2017-07-14 DIAGNOSIS — C7800 Secondary malignant neoplasm of unspecified lung: Secondary | ICD-10-CM | POA: Diagnosis not present

## 2017-07-14 DIAGNOSIS — Z452 Encounter for adjustment and management of vascular access device: Secondary | ICD-10-CM | POA: Diagnosis not present

## 2017-07-16 DIAGNOSIS — J309 Allergic rhinitis, unspecified: Secondary | ICD-10-CM | POA: Diagnosis not present

## 2017-07-16 DIAGNOSIS — C20 Malignant neoplasm of rectum: Secondary | ICD-10-CM | POA: Diagnosis not present

## 2017-07-16 DIAGNOSIS — R0982 Postnasal drip: Secondary | ICD-10-CM | POA: Diagnosis not present

## 2017-07-16 DIAGNOSIS — I10 Essential (primary) hypertension: Secondary | ICD-10-CM | POA: Diagnosis not present

## 2017-07-19 DIAGNOSIS — R300 Dysuria: Secondary | ICD-10-CM | POA: Diagnosis not present

## 2017-07-19 DIAGNOSIS — C218 Malignant neoplasm of overlapping sites of rectum, anus and anal canal: Secondary | ICD-10-CM | POA: Diagnosis not present

## 2017-07-21 DIAGNOSIS — C218 Malignant neoplasm of overlapping sites of rectum, anus and anal canal: Secondary | ICD-10-CM | POA: Diagnosis not present

## 2017-07-21 DIAGNOSIS — C7800 Secondary malignant neoplasm of unspecified lung: Secondary | ICD-10-CM | POA: Diagnosis not present

## 2017-07-28 DIAGNOSIS — D696 Thrombocytopenia, unspecified: Secondary | ICD-10-CM | POA: Diagnosis not present

## 2017-07-28 DIAGNOSIS — D6959 Other secondary thrombocytopenia: Secondary | ICD-10-CM | POA: Diagnosis not present

## 2017-07-28 DIAGNOSIS — C7800 Secondary malignant neoplasm of unspecified lung: Secondary | ICD-10-CM | POA: Diagnosis not present

## 2017-07-28 DIAGNOSIS — R9431 Abnormal electrocardiogram [ECG] [EKG]: Secondary | ICD-10-CM | POA: Diagnosis not present

## 2017-07-28 DIAGNOSIS — C218 Malignant neoplasm of overlapping sites of rectum, anus and anal canal: Secondary | ICD-10-CM | POA: Diagnosis not present

## 2017-07-30 DIAGNOSIS — C218 Malignant neoplasm of overlapping sites of rectum, anus and anal canal: Secondary | ICD-10-CM | POA: Diagnosis not present

## 2017-07-30 DIAGNOSIS — Z452 Encounter for adjustment and management of vascular access device: Secondary | ICD-10-CM | POA: Diagnosis not present

## 2017-08-09 DIAGNOSIS — C218 Malignant neoplasm of overlapping sites of rectum, anus and anal canal: Secondary | ICD-10-CM | POA: Diagnosis not present

## 2017-08-09 DIAGNOSIS — C7800 Secondary malignant neoplasm of unspecified lung: Secondary | ICD-10-CM | POA: Diagnosis not present

## 2017-08-09 DIAGNOSIS — Z515 Encounter for palliative care: Secondary | ICD-10-CM | POA: Diagnosis not present

## 2017-08-09 DIAGNOSIS — R0981 Nasal congestion: Secondary | ICD-10-CM | POA: Diagnosis not present

## 2017-08-09 DIAGNOSIS — D696 Thrombocytopenia, unspecified: Secondary | ICD-10-CM | POA: Diagnosis not present

## 2017-08-11 DIAGNOSIS — C218 Malignant neoplasm of overlapping sites of rectum, anus and anal canal: Secondary | ICD-10-CM | POA: Diagnosis not present

## 2017-08-11 DIAGNOSIS — C7800 Secondary malignant neoplasm of unspecified lung: Secondary | ICD-10-CM | POA: Diagnosis not present

## 2017-08-11 DIAGNOSIS — Z452 Encounter for adjustment and management of vascular access device: Secondary | ICD-10-CM | POA: Diagnosis not present

## 2017-08-23 DIAGNOSIS — C7800 Secondary malignant neoplasm of unspecified lung: Secondary | ICD-10-CM | POA: Diagnosis not present

## 2017-08-23 DIAGNOSIS — Z23 Encounter for immunization: Secondary | ICD-10-CM | POA: Diagnosis not present

## 2017-08-23 DIAGNOSIS — C218 Malignant neoplasm of overlapping sites of rectum, anus and anal canal: Secondary | ICD-10-CM | POA: Diagnosis not present

## 2017-08-23 DIAGNOSIS — D6959 Other secondary thrombocytopenia: Secondary | ICD-10-CM | POA: Diagnosis not present

## 2017-08-25 DIAGNOSIS — C7802 Secondary malignant neoplasm of left lung: Secondary | ICD-10-CM | POA: Diagnosis not present

## 2017-08-25 DIAGNOSIS — C218 Malignant neoplasm of overlapping sites of rectum, anus and anal canal: Secondary | ICD-10-CM | POA: Diagnosis not present

## 2017-08-25 DIAGNOSIS — Z452 Encounter for adjustment and management of vascular access device: Secondary | ICD-10-CM | POA: Diagnosis not present

## 2017-08-25 DIAGNOSIS — C7801 Secondary malignant neoplasm of right lung: Secondary | ICD-10-CM | POA: Diagnosis not present

## 2017-08-26 DIAGNOSIS — C20 Malignant neoplasm of rectum: Secondary | ICD-10-CM | POA: Diagnosis not present

## 2017-08-26 DIAGNOSIS — E785 Hyperlipidemia, unspecified: Secondary | ICD-10-CM | POA: Diagnosis not present

## 2017-08-26 DIAGNOSIS — E1165 Type 2 diabetes mellitus with hyperglycemia: Secondary | ICD-10-CM | POA: Diagnosis not present

## 2017-08-26 DIAGNOSIS — I10 Essential (primary) hypertension: Secondary | ICD-10-CM | POA: Diagnosis not present

## 2017-09-06 DIAGNOSIS — C7802 Secondary malignant neoplasm of left lung: Secondary | ICD-10-CM | POA: Diagnosis not present

## 2017-09-06 DIAGNOSIS — C218 Malignant neoplasm of overlapping sites of rectum, anus and anal canal: Secondary | ICD-10-CM | POA: Diagnosis not present

## 2017-09-06 DIAGNOSIS — Z515 Encounter for palliative care: Secondary | ICD-10-CM | POA: Diagnosis not present

## 2017-09-06 DIAGNOSIS — C7801 Secondary malignant neoplasm of right lung: Secondary | ICD-10-CM | POA: Diagnosis not present

## 2017-09-08 DIAGNOSIS — C218 Malignant neoplasm of overlapping sites of rectum, anus and anal canal: Secondary | ICD-10-CM | POA: Diagnosis not present

## 2017-09-08 DIAGNOSIS — C7802 Secondary malignant neoplasm of left lung: Secondary | ICD-10-CM | POA: Diagnosis not present

## 2017-09-08 DIAGNOSIS — C7801 Secondary malignant neoplasm of right lung: Secondary | ICD-10-CM | POA: Diagnosis not present

## 2017-09-08 DIAGNOSIS — Z452 Encounter for adjustment and management of vascular access device: Secondary | ICD-10-CM | POA: Diagnosis not present

## 2017-09-20 DIAGNOSIS — D696 Thrombocytopenia, unspecified: Secondary | ICD-10-CM | POA: Diagnosis not present

## 2017-09-20 DIAGNOSIS — C7800 Secondary malignant neoplasm of unspecified lung: Secondary | ICD-10-CM | POA: Diagnosis not present

## 2017-09-20 DIAGNOSIS — C218 Malignant neoplasm of overlapping sites of rectum, anus and anal canal: Secondary | ICD-10-CM | POA: Diagnosis not present

## 2017-09-20 DIAGNOSIS — Z923 Personal history of irradiation: Secondary | ICD-10-CM | POA: Diagnosis not present

## 2017-09-20 DIAGNOSIS — Z9221 Personal history of antineoplastic chemotherapy: Secondary | ICD-10-CM | POA: Diagnosis not present

## 2017-09-22 DIAGNOSIS — C7800 Secondary malignant neoplasm of unspecified lung: Secondary | ICD-10-CM | POA: Diagnosis not present

## 2017-09-22 DIAGNOSIS — Z452 Encounter for adjustment and management of vascular access device: Secondary | ICD-10-CM | POA: Diagnosis not present

## 2017-09-22 DIAGNOSIS — C218 Malignant neoplasm of overlapping sites of rectum, anus and anal canal: Secondary | ICD-10-CM | POA: Diagnosis not present

## 2017-09-29 DIAGNOSIS — R9431 Abnormal electrocardiogram [ECG] [EKG]: Secondary | ICD-10-CM | POA: Diagnosis not present

## 2017-09-29 DIAGNOSIS — C19 Malignant neoplasm of rectosigmoid junction: Secondary | ICD-10-CM | POA: Diagnosis not present

## 2017-09-29 DIAGNOSIS — I10 Essential (primary) hypertension: Secondary | ICD-10-CM

## 2017-09-29 DIAGNOSIS — Z01810 Encounter for preprocedural cardiovascular examination: Secondary | ICD-10-CM | POA: Diagnosis not present

## 2017-09-29 HISTORY — DX: Essential (primary) hypertension: I10

## 2017-09-30 DIAGNOSIS — C19 Malignant neoplasm of rectosigmoid junction: Secondary | ICD-10-CM | POA: Diagnosis not present

## 2017-09-30 DIAGNOSIS — Z01810 Encounter for preprocedural cardiovascular examination: Secondary | ICD-10-CM | POA: Diagnosis not present

## 2017-09-30 DIAGNOSIS — R9431 Abnormal electrocardiogram [ECG] [EKG]: Secondary | ICD-10-CM | POA: Diagnosis not present

## 2017-09-30 DIAGNOSIS — I10 Essential (primary) hypertension: Secondary | ICD-10-CM | POA: Diagnosis not present

## 2017-10-01 DIAGNOSIS — I7090 Unspecified atherosclerosis: Secondary | ICD-10-CM | POA: Diagnosis not present

## 2017-10-01 DIAGNOSIS — C7800 Secondary malignant neoplasm of unspecified lung: Secondary | ICD-10-CM | POA: Diagnosis not present

## 2017-10-01 DIAGNOSIS — C189 Malignant neoplasm of colon, unspecified: Secondary | ICD-10-CM | POA: Diagnosis not present

## 2017-10-01 DIAGNOSIS — K760 Fatty (change of) liver, not elsewhere classified: Secondary | ICD-10-CM | POA: Diagnosis not present

## 2017-10-01 DIAGNOSIS — C218 Malignant neoplasm of overlapping sites of rectum, anus and anal canal: Secondary | ICD-10-CM | POA: Diagnosis not present

## 2017-10-01 DIAGNOSIS — R911 Solitary pulmonary nodule: Secondary | ICD-10-CM | POA: Diagnosis not present

## 2017-10-04 DIAGNOSIS — C218 Malignant neoplasm of overlapping sites of rectum, anus and anal canal: Secondary | ICD-10-CM | POA: Diagnosis not present

## 2017-10-04 DIAGNOSIS — C7800 Secondary malignant neoplasm of unspecified lung: Secondary | ICD-10-CM | POA: Diagnosis not present

## 2017-10-04 DIAGNOSIS — Z923 Personal history of irradiation: Secondary | ICD-10-CM | POA: Diagnosis not present

## 2017-10-04 DIAGNOSIS — Z9221 Personal history of antineoplastic chemotherapy: Secondary | ICD-10-CM | POA: Diagnosis not present

## 2017-10-05 DIAGNOSIS — Z01810 Encounter for preprocedural cardiovascular examination: Secondary | ICD-10-CM | POA: Diagnosis not present

## 2017-10-05 DIAGNOSIS — R9431 Abnormal electrocardiogram [ECG] [EKG]: Secondary | ICD-10-CM | POA: Diagnosis not present

## 2017-10-06 DIAGNOSIS — Z452 Encounter for adjustment and management of vascular access device: Secondary | ICD-10-CM | POA: Diagnosis not present

## 2017-10-06 DIAGNOSIS — C7800 Secondary malignant neoplasm of unspecified lung: Secondary | ICD-10-CM | POA: Diagnosis not present

## 2017-10-06 DIAGNOSIS — C218 Malignant neoplasm of overlapping sites of rectum, anus and anal canal: Secondary | ICD-10-CM | POA: Diagnosis not present

## 2017-10-13 DIAGNOSIS — R9431 Abnormal electrocardiogram [ECG] [EKG]: Secondary | ICD-10-CM | POA: Diagnosis not present

## 2017-10-13 DIAGNOSIS — Z01810 Encounter for preprocedural cardiovascular examination: Secondary | ICD-10-CM | POA: Diagnosis not present

## 2017-10-19 DIAGNOSIS — C218 Malignant neoplasm of overlapping sites of rectum, anus and anal canal: Secondary | ICD-10-CM | POA: Diagnosis not present

## 2017-10-19 DIAGNOSIS — C7801 Secondary malignant neoplasm of right lung: Secondary | ICD-10-CM | POA: Diagnosis not present

## 2017-10-19 DIAGNOSIS — Z0001 Encounter for general adult medical examination with abnormal findings: Secondary | ICD-10-CM | POA: Diagnosis not present

## 2017-10-19 DIAGNOSIS — D649 Anemia, unspecified: Secondary | ICD-10-CM | POA: Diagnosis not present

## 2017-10-19 DIAGNOSIS — C7802 Secondary malignant neoplasm of left lung: Secondary | ICD-10-CM | POA: Diagnosis not present

## 2017-10-21 DIAGNOSIS — C7801 Secondary malignant neoplasm of right lung: Secondary | ICD-10-CM | POA: Diagnosis not present

## 2017-10-21 DIAGNOSIS — C218 Malignant neoplasm of overlapping sites of rectum, anus and anal canal: Secondary | ICD-10-CM | POA: Diagnosis not present

## 2017-10-21 DIAGNOSIS — C7802 Secondary malignant neoplasm of left lung: Secondary | ICD-10-CM | POA: Diagnosis not present

## 2017-10-21 DIAGNOSIS — Z452 Encounter for adjustment and management of vascular access device: Secondary | ICD-10-CM | POA: Diagnosis not present

## 2017-11-02 DIAGNOSIS — C7801 Secondary malignant neoplasm of right lung: Secondary | ICD-10-CM | POA: Diagnosis not present

## 2017-11-02 DIAGNOSIS — C7802 Secondary malignant neoplasm of left lung: Secondary | ICD-10-CM | POA: Diagnosis not present

## 2017-11-02 DIAGNOSIS — Z515 Encounter for palliative care: Secondary | ICD-10-CM | POA: Diagnosis not present

## 2017-11-02 DIAGNOSIS — D649 Anemia, unspecified: Secondary | ICD-10-CM | POA: Diagnosis not present

## 2017-11-02 DIAGNOSIS — R634 Abnormal weight loss: Secondary | ICD-10-CM | POA: Diagnosis not present

## 2017-11-02 DIAGNOSIS — C218 Malignant neoplasm of overlapping sites of rectum, anus and anal canal: Secondary | ICD-10-CM | POA: Diagnosis not present

## 2017-11-04 DIAGNOSIS — C7801 Secondary malignant neoplasm of right lung: Secondary | ICD-10-CM | POA: Diagnosis not present

## 2017-11-04 DIAGNOSIS — C7802 Secondary malignant neoplasm of left lung: Secondary | ICD-10-CM | POA: Diagnosis not present

## 2017-11-04 DIAGNOSIS — C218 Malignant neoplasm of overlapping sites of rectum, anus and anal canal: Secondary | ICD-10-CM | POA: Diagnosis not present

## 2017-11-04 DIAGNOSIS — Z452 Encounter for adjustment and management of vascular access device: Secondary | ICD-10-CM | POA: Diagnosis not present

## 2017-11-10 DIAGNOSIS — I10 Essential (primary) hypertension: Secondary | ICD-10-CM | POA: Diagnosis not present

## 2017-11-10 DIAGNOSIS — R9431 Abnormal electrocardiogram [ECG] [EKG]: Secondary | ICD-10-CM | POA: Diagnosis not present

## 2017-11-10 DIAGNOSIS — C19 Malignant neoplasm of rectosigmoid junction: Secondary | ICD-10-CM | POA: Diagnosis not present

## 2017-11-15 DIAGNOSIS — C218 Malignant neoplasm of overlapping sites of rectum, anus and anal canal: Secondary | ICD-10-CM | POA: Diagnosis not present

## 2017-11-15 DIAGNOSIS — Z5111 Encounter for antineoplastic chemotherapy: Secondary | ICD-10-CM | POA: Diagnosis not present

## 2017-11-17 DIAGNOSIS — Z452 Encounter for adjustment and management of vascular access device: Secondary | ICD-10-CM | POA: Diagnosis not present

## 2017-11-17 DIAGNOSIS — C218 Malignant neoplasm of overlapping sites of rectum, anus and anal canal: Secondary | ICD-10-CM | POA: Diagnosis not present

## 2017-11-29 DIAGNOSIS — D701 Agranulocytosis secondary to cancer chemotherapy: Secondary | ICD-10-CM | POA: Diagnosis not present

## 2017-11-29 DIAGNOSIS — Z9221 Personal history of antineoplastic chemotherapy: Secondary | ICD-10-CM | POA: Diagnosis not present

## 2017-11-29 DIAGNOSIS — C7800 Secondary malignant neoplasm of unspecified lung: Secondary | ICD-10-CM | POA: Diagnosis not present

## 2017-11-29 DIAGNOSIS — C7801 Secondary malignant neoplasm of right lung: Secondary | ICD-10-CM | POA: Diagnosis not present

## 2017-11-29 DIAGNOSIS — C218 Malignant neoplasm of overlapping sites of rectum, anus and anal canal: Secondary | ICD-10-CM | POA: Diagnosis not present

## 2017-11-29 DIAGNOSIS — Z923 Personal history of irradiation: Secondary | ICD-10-CM | POA: Diagnosis not present

## 2017-11-29 DIAGNOSIS — Z515 Encounter for palliative care: Secondary | ICD-10-CM | POA: Diagnosis not present

## 2017-11-29 DIAGNOSIS — C7802 Secondary malignant neoplasm of left lung: Secondary | ICD-10-CM | POA: Diagnosis not present

## 2017-11-29 DIAGNOSIS — D6959 Other secondary thrombocytopenia: Secondary | ICD-10-CM | POA: Diagnosis not present

## 2017-12-01 DIAGNOSIS — C218 Malignant neoplasm of overlapping sites of rectum, anus and anal canal: Secondary | ICD-10-CM | POA: Diagnosis not present

## 2017-12-01 DIAGNOSIS — Z452 Encounter for adjustment and management of vascular access device: Secondary | ICD-10-CM | POA: Diagnosis not present

## 2017-12-13 DIAGNOSIS — C7802 Secondary malignant neoplasm of left lung: Secondary | ICD-10-CM | POA: Diagnosis not present

## 2017-12-13 DIAGNOSIS — C218 Malignant neoplasm of overlapping sites of rectum, anus and anal canal: Secondary | ICD-10-CM | POA: Diagnosis not present

## 2017-12-13 DIAGNOSIS — C7801 Secondary malignant neoplasm of right lung: Secondary | ICD-10-CM | POA: Diagnosis not present

## 2017-12-15 DIAGNOSIS — C218 Malignant neoplasm of overlapping sites of rectum, anus and anal canal: Secondary | ICD-10-CM | POA: Diagnosis not present

## 2017-12-15 DIAGNOSIS — Z515 Encounter for palliative care: Secondary | ICD-10-CM | POA: Diagnosis not present

## 2017-12-15 DIAGNOSIS — C7802 Secondary malignant neoplasm of left lung: Secondary | ICD-10-CM | POA: Diagnosis not present

## 2017-12-15 DIAGNOSIS — Z452 Encounter for adjustment and management of vascular access device: Secondary | ICD-10-CM | POA: Diagnosis not present

## 2017-12-15 DIAGNOSIS — C7801 Secondary malignant neoplasm of right lung: Secondary | ICD-10-CM | POA: Diagnosis not present

## 2017-12-24 DIAGNOSIS — R918 Other nonspecific abnormal finding of lung field: Secondary | ICD-10-CM | POA: Diagnosis not present

## 2017-12-24 DIAGNOSIS — I700 Atherosclerosis of aorta: Secondary | ICD-10-CM | POA: Diagnosis not present

## 2017-12-24 DIAGNOSIS — C218 Malignant neoplasm of overlapping sites of rectum, anus and anal canal: Secondary | ICD-10-CM | POA: Diagnosis not present

## 2017-12-27 DIAGNOSIS — C7802 Secondary malignant neoplasm of left lung: Secondary | ICD-10-CM | POA: Diagnosis not present

## 2017-12-27 DIAGNOSIS — C7801 Secondary malignant neoplasm of right lung: Secondary | ICD-10-CM | POA: Diagnosis not present

## 2017-12-27 DIAGNOSIS — Z9221 Personal history of antineoplastic chemotherapy: Secondary | ICD-10-CM | POA: Diagnosis not present

## 2017-12-27 DIAGNOSIS — Z923 Personal history of irradiation: Secondary | ICD-10-CM | POA: Diagnosis not present

## 2017-12-27 DIAGNOSIS — C218 Malignant neoplasm of overlapping sites of rectum, anus and anal canal: Secondary | ICD-10-CM | POA: Diagnosis not present

## 2017-12-27 DIAGNOSIS — C7800 Secondary malignant neoplasm of unspecified lung: Secondary | ICD-10-CM | POA: Diagnosis not present

## 2017-12-29 DIAGNOSIS — Z452 Encounter for adjustment and management of vascular access device: Secondary | ICD-10-CM | POA: Diagnosis not present

## 2017-12-29 DIAGNOSIS — C218 Malignant neoplasm of overlapping sites of rectum, anus and anal canal: Secondary | ICD-10-CM | POA: Diagnosis not present

## 2017-12-29 DIAGNOSIS — Z515 Encounter for palliative care: Secondary | ICD-10-CM | POA: Diagnosis not present

## 2018-01-10 DIAGNOSIS — Z515 Encounter for palliative care: Secondary | ICD-10-CM | POA: Diagnosis not present

## 2018-01-10 DIAGNOSIS — C7801 Secondary malignant neoplasm of right lung: Secondary | ICD-10-CM | POA: Diagnosis not present

## 2018-01-10 DIAGNOSIS — C218 Malignant neoplasm of overlapping sites of rectum, anus and anal canal: Secondary | ICD-10-CM | POA: Diagnosis not present

## 2018-01-10 DIAGNOSIS — C7802 Secondary malignant neoplasm of left lung: Secondary | ICD-10-CM | POA: Diagnosis not present

## 2018-01-12 DIAGNOSIS — Z515 Encounter for palliative care: Secondary | ICD-10-CM | POA: Diagnosis not present

## 2018-01-12 DIAGNOSIS — Z452 Encounter for adjustment and management of vascular access device: Secondary | ICD-10-CM | POA: Diagnosis not present

## 2018-01-12 DIAGNOSIS — C7802 Secondary malignant neoplasm of left lung: Secondary | ICD-10-CM | POA: Diagnosis not present

## 2018-01-12 DIAGNOSIS — C7801 Secondary malignant neoplasm of right lung: Secondary | ICD-10-CM | POA: Diagnosis not present

## 2018-01-12 DIAGNOSIS — C218 Malignant neoplasm of overlapping sites of rectum, anus and anal canal: Secondary | ICD-10-CM | POA: Diagnosis not present

## 2018-01-24 DIAGNOSIS — C7802 Secondary malignant neoplasm of left lung: Secondary | ICD-10-CM | POA: Diagnosis not present

## 2018-01-24 DIAGNOSIS — C218 Malignant neoplasm of overlapping sites of rectum, anus and anal canal: Secondary | ICD-10-CM | POA: Diagnosis not present

## 2018-01-24 DIAGNOSIS — Z515 Encounter for palliative care: Secondary | ICD-10-CM | POA: Diagnosis not present

## 2018-01-24 DIAGNOSIS — Z923 Personal history of irradiation: Secondary | ICD-10-CM | POA: Diagnosis not present

## 2018-01-24 DIAGNOSIS — Z9221 Personal history of antineoplastic chemotherapy: Secondary | ICD-10-CM | POA: Diagnosis not present

## 2018-01-24 DIAGNOSIS — C7801 Secondary malignant neoplasm of right lung: Secondary | ICD-10-CM | POA: Diagnosis not present

## 2018-01-24 DIAGNOSIS — D696 Thrombocytopenia, unspecified: Secondary | ICD-10-CM | POA: Diagnosis not present

## 2018-01-24 DIAGNOSIS — C7800 Secondary malignant neoplasm of unspecified lung: Secondary | ICD-10-CM | POA: Diagnosis not present

## 2018-01-26 DIAGNOSIS — C7800 Secondary malignant neoplasm of unspecified lung: Secondary | ICD-10-CM | POA: Diagnosis not present

## 2018-01-26 DIAGNOSIS — C218 Malignant neoplasm of overlapping sites of rectum, anus and anal canal: Secondary | ICD-10-CM | POA: Diagnosis not present

## 2018-01-26 DIAGNOSIS — Z452 Encounter for adjustment and management of vascular access device: Secondary | ICD-10-CM | POA: Diagnosis not present

## 2018-02-07 DIAGNOSIS — D696 Thrombocytopenia, unspecified: Secondary | ICD-10-CM | POA: Diagnosis not present

## 2018-02-07 DIAGNOSIS — C218 Malignant neoplasm of overlapping sites of rectum, anus and anal canal: Secondary | ICD-10-CM | POA: Diagnosis not present

## 2018-02-07 DIAGNOSIS — Z515 Encounter for palliative care: Secondary | ICD-10-CM | POA: Diagnosis not present

## 2018-02-07 DIAGNOSIS — C7800 Secondary malignant neoplasm of unspecified lung: Secondary | ICD-10-CM | POA: Diagnosis not present

## 2018-02-09 DIAGNOSIS — C218 Malignant neoplasm of overlapping sites of rectum, anus and anal canal: Secondary | ICD-10-CM | POA: Diagnosis not present

## 2018-02-09 DIAGNOSIS — C7800 Secondary malignant neoplasm of unspecified lung: Secondary | ICD-10-CM | POA: Diagnosis not present

## 2018-02-09 DIAGNOSIS — Z452 Encounter for adjustment and management of vascular access device: Secondary | ICD-10-CM | POA: Diagnosis not present

## 2018-02-21 DIAGNOSIS — Z923 Personal history of irradiation: Secondary | ICD-10-CM | POA: Diagnosis not present

## 2018-02-21 DIAGNOSIS — Z9221 Personal history of antineoplastic chemotherapy: Secondary | ICD-10-CM | POA: Diagnosis not present

## 2018-02-21 DIAGNOSIS — D696 Thrombocytopenia, unspecified: Secondary | ICD-10-CM | POA: Diagnosis not present

## 2018-02-21 DIAGNOSIS — C218 Malignant neoplasm of overlapping sites of rectum, anus and anal canal: Secondary | ICD-10-CM | POA: Diagnosis not present

## 2018-02-21 DIAGNOSIS — C7800 Secondary malignant neoplasm of unspecified lung: Secondary | ICD-10-CM | POA: Diagnosis not present

## 2018-02-21 DIAGNOSIS — Z515 Encounter for palliative care: Secondary | ICD-10-CM | POA: Diagnosis not present

## 2018-02-23 DIAGNOSIS — Z452 Encounter for adjustment and management of vascular access device: Secondary | ICD-10-CM | POA: Diagnosis not present

## 2018-02-23 DIAGNOSIS — C7800 Secondary malignant neoplasm of unspecified lung: Secondary | ICD-10-CM | POA: Diagnosis not present

## 2018-02-23 DIAGNOSIS — C218 Malignant neoplasm of overlapping sites of rectum, anus and anal canal: Secondary | ICD-10-CM | POA: Diagnosis not present

## 2018-03-07 DIAGNOSIS — C7800 Secondary malignant neoplasm of unspecified lung: Secondary | ICD-10-CM | POA: Diagnosis not present

## 2018-03-07 DIAGNOSIS — C218 Malignant neoplasm of overlapping sites of rectum, anus and anal canal: Secondary | ICD-10-CM | POA: Diagnosis not present

## 2018-03-09 DIAGNOSIS — C7800 Secondary malignant neoplasm of unspecified lung: Secondary | ICD-10-CM | POA: Diagnosis not present

## 2018-03-09 DIAGNOSIS — C218 Malignant neoplasm of overlapping sites of rectum, anus and anal canal: Secondary | ICD-10-CM | POA: Diagnosis not present

## 2018-03-09 DIAGNOSIS — Z452 Encounter for adjustment and management of vascular access device: Secondary | ICD-10-CM | POA: Diagnosis not present

## 2018-03-21 DIAGNOSIS — Z515 Encounter for palliative care: Secondary | ICD-10-CM | POA: Diagnosis not present

## 2018-03-21 DIAGNOSIS — C7801 Secondary malignant neoplasm of right lung: Secondary | ICD-10-CM | POA: Diagnosis not present

## 2018-03-21 DIAGNOSIS — C218 Malignant neoplasm of overlapping sites of rectum, anus and anal canal: Secondary | ICD-10-CM | POA: Diagnosis not present

## 2018-03-21 DIAGNOSIS — C7802 Secondary malignant neoplasm of left lung: Secondary | ICD-10-CM | POA: Diagnosis not present

## 2018-03-24 DIAGNOSIS — Z452 Encounter for adjustment and management of vascular access device: Secondary | ICD-10-CM | POA: Diagnosis not present

## 2018-03-24 DIAGNOSIS — C218 Malignant neoplasm of overlapping sites of rectum, anus and anal canal: Secondary | ICD-10-CM | POA: Diagnosis not present

## 2018-04-01 DIAGNOSIS — K4090 Unilateral inguinal hernia, without obstruction or gangrene, not specified as recurrent: Secondary | ICD-10-CM | POA: Diagnosis not present

## 2018-04-01 DIAGNOSIS — C218 Malignant neoplasm of overlapping sites of rectum, anus and anal canal: Secondary | ICD-10-CM | POA: Diagnosis not present

## 2018-04-01 DIAGNOSIS — I700 Atherosclerosis of aorta: Secondary | ICD-10-CM | POA: Diagnosis not present

## 2018-04-01 DIAGNOSIS — R918 Other nonspecific abnormal finding of lung field: Secondary | ICD-10-CM | POA: Diagnosis not present

## 2018-04-01 DIAGNOSIS — C20 Malignant neoplasm of rectum: Secondary | ICD-10-CM | POA: Diagnosis not present

## 2018-04-01 DIAGNOSIS — I2510 Atherosclerotic heart disease of native coronary artery without angina pectoris: Secondary | ICD-10-CM | POA: Diagnosis not present

## 2018-04-01 DIAGNOSIS — R161 Splenomegaly, not elsewhere classified: Secondary | ICD-10-CM | POA: Diagnosis not present

## 2018-04-05 DIAGNOSIS — C7800 Secondary malignant neoplasm of unspecified lung: Secondary | ICD-10-CM | POA: Diagnosis not present

## 2018-04-05 DIAGNOSIS — Z515 Encounter for palliative care: Secondary | ICD-10-CM | POA: Diagnosis not present

## 2018-04-05 DIAGNOSIS — C218 Malignant neoplasm of overlapping sites of rectum, anus and anal canal: Secondary | ICD-10-CM | POA: Diagnosis not present

## 2018-04-05 DIAGNOSIS — C7801 Secondary malignant neoplasm of right lung: Secondary | ICD-10-CM | POA: Diagnosis not present

## 2018-04-07 DIAGNOSIS — Z452 Encounter for adjustment and management of vascular access device: Secondary | ICD-10-CM | POA: Diagnosis not present

## 2018-04-07 DIAGNOSIS — C218 Malignant neoplasm of overlapping sites of rectum, anus and anal canal: Secondary | ICD-10-CM | POA: Diagnosis not present

## 2018-04-18 DIAGNOSIS — Z5111 Encounter for antineoplastic chemotherapy: Secondary | ICD-10-CM | POA: Diagnosis not present

## 2018-04-18 DIAGNOSIS — C218 Malignant neoplasm of overlapping sites of rectum, anus and anal canal: Secondary | ICD-10-CM | POA: Diagnosis not present

## 2018-04-20 DIAGNOSIS — C218 Malignant neoplasm of overlapping sites of rectum, anus and anal canal: Secondary | ICD-10-CM | POA: Diagnosis not present

## 2018-04-20 DIAGNOSIS — Z452 Encounter for adjustment and management of vascular access device: Secondary | ICD-10-CM | POA: Diagnosis not present

## 2018-05-02 DIAGNOSIS — C218 Malignant neoplasm of overlapping sites of rectum, anus and anal canal: Secondary | ICD-10-CM | POA: Diagnosis not present

## 2018-05-02 DIAGNOSIS — C7801 Secondary malignant neoplasm of right lung: Secondary | ICD-10-CM | POA: Diagnosis not present

## 2018-05-02 DIAGNOSIS — Z515 Encounter for palliative care: Secondary | ICD-10-CM | POA: Diagnosis not present

## 2018-05-02 DIAGNOSIS — C7802 Secondary malignant neoplasm of left lung: Secondary | ICD-10-CM | POA: Diagnosis not present

## 2018-05-04 DIAGNOSIS — C218 Malignant neoplasm of overlapping sites of rectum, anus and anal canal: Secondary | ICD-10-CM | POA: Diagnosis not present

## 2018-05-04 DIAGNOSIS — C7801 Secondary malignant neoplasm of right lung: Secondary | ICD-10-CM | POA: Diagnosis not present

## 2018-05-04 DIAGNOSIS — Z452 Encounter for adjustment and management of vascular access device: Secondary | ICD-10-CM | POA: Diagnosis not present

## 2018-05-04 DIAGNOSIS — C802 Malignant neoplasm associated with transplanted organ: Secondary | ICD-10-CM | POA: Diagnosis not present

## 2018-05-04 DIAGNOSIS — Z515 Encounter for palliative care: Secondary | ICD-10-CM | POA: Diagnosis not present

## 2018-05-16 DIAGNOSIS — E876 Hypokalemia: Secondary | ICD-10-CM | POA: Diagnosis not present

## 2018-05-16 DIAGNOSIS — J069 Acute upper respiratory infection, unspecified: Secondary | ICD-10-CM | POA: Diagnosis not present

## 2018-05-16 DIAGNOSIS — C218 Malignant neoplasm of overlapping sites of rectum, anus and anal canal: Secondary | ICD-10-CM | POA: Diagnosis not present

## 2018-05-16 DIAGNOSIS — C7800 Secondary malignant neoplasm of unspecified lung: Secondary | ICD-10-CM | POA: Diagnosis not present

## 2018-05-18 DIAGNOSIS — Z452 Encounter for adjustment and management of vascular access device: Secondary | ICD-10-CM | POA: Diagnosis not present

## 2018-05-18 DIAGNOSIS — C7800 Secondary malignant neoplasm of unspecified lung: Secondary | ICD-10-CM | POA: Diagnosis not present

## 2018-05-18 DIAGNOSIS — C218 Malignant neoplasm of overlapping sites of rectum, anus and anal canal: Secondary | ICD-10-CM | POA: Diagnosis not present

## 2018-05-23 DIAGNOSIS — C7802 Secondary malignant neoplasm of left lung: Secondary | ICD-10-CM | POA: Diagnosis not present

## 2018-05-23 DIAGNOSIS — C218 Malignant neoplasm of overlapping sites of rectum, anus and anal canal: Secondary | ICD-10-CM | POA: Diagnosis not present

## 2018-05-23 DIAGNOSIS — C7801 Secondary malignant neoplasm of right lung: Secondary | ICD-10-CM | POA: Diagnosis not present

## 2018-05-30 DIAGNOSIS — Z5111 Encounter for antineoplastic chemotherapy: Secondary | ICD-10-CM | POA: Diagnosis not present

## 2018-05-30 DIAGNOSIS — Z515 Encounter for palliative care: Secondary | ICD-10-CM | POA: Diagnosis not present

## 2018-05-30 DIAGNOSIS — C218 Malignant neoplasm of overlapping sites of rectum, anus and anal canal: Secondary | ICD-10-CM | POA: Diagnosis not present

## 2018-05-30 DIAGNOSIS — C7802 Secondary malignant neoplasm of left lung: Secondary | ICD-10-CM | POA: Diagnosis not present

## 2018-05-30 DIAGNOSIS — C7801 Secondary malignant neoplasm of right lung: Secondary | ICD-10-CM | POA: Diagnosis not present

## 2018-06-01 DIAGNOSIS — Z452 Encounter for adjustment and management of vascular access device: Secondary | ICD-10-CM | POA: Diagnosis not present

## 2018-06-01 DIAGNOSIS — C218 Malignant neoplasm of overlapping sites of rectum, anus and anal canal: Secondary | ICD-10-CM | POA: Diagnosis not present

## 2018-06-13 DIAGNOSIS — C218 Malignant neoplasm of overlapping sites of rectum, anus and anal canal: Secondary | ICD-10-CM | POA: Diagnosis not present

## 2018-06-13 DIAGNOSIS — C7802 Secondary malignant neoplasm of left lung: Secondary | ICD-10-CM | POA: Diagnosis not present

## 2018-06-13 DIAGNOSIS — C7801 Secondary malignant neoplasm of right lung: Secondary | ICD-10-CM | POA: Diagnosis not present

## 2018-06-13 DIAGNOSIS — G620 Drug-induced polyneuropathy: Secondary | ICD-10-CM | POA: Diagnosis not present

## 2018-06-13 DIAGNOSIS — D6959 Other secondary thrombocytopenia: Secondary | ICD-10-CM | POA: Diagnosis not present

## 2018-06-13 DIAGNOSIS — C7800 Secondary malignant neoplasm of unspecified lung: Secondary | ICD-10-CM | POA: Diagnosis not present

## 2018-06-15 DIAGNOSIS — C7800 Secondary malignant neoplasm of unspecified lung: Secondary | ICD-10-CM | POA: Diagnosis not present

## 2018-06-15 DIAGNOSIS — Z452 Encounter for adjustment and management of vascular access device: Secondary | ICD-10-CM | POA: Diagnosis not present

## 2018-06-15 DIAGNOSIS — C218 Malignant neoplasm of overlapping sites of rectum, anus and anal canal: Secondary | ICD-10-CM | POA: Diagnosis not present

## 2018-06-27 DIAGNOSIS — Z5111 Encounter for antineoplastic chemotherapy: Secondary | ICD-10-CM | POA: Diagnosis not present

## 2018-06-27 DIAGNOSIS — C7800 Secondary malignant neoplasm of unspecified lung: Secondary | ICD-10-CM | POA: Diagnosis not present

## 2018-06-27 DIAGNOSIS — C218 Malignant neoplasm of overlapping sites of rectum, anus and anal canal: Secondary | ICD-10-CM | POA: Diagnosis not present

## 2018-06-29 DIAGNOSIS — Z452 Encounter for adjustment and management of vascular access device: Secondary | ICD-10-CM | POA: Diagnosis not present

## 2018-06-29 DIAGNOSIS — C218 Malignant neoplasm of overlapping sites of rectum, anus and anal canal: Secondary | ICD-10-CM | POA: Diagnosis not present

## 2018-07-08 DIAGNOSIS — C189 Malignant neoplasm of colon, unspecified: Secondary | ICD-10-CM | POA: Diagnosis not present

## 2018-07-08 DIAGNOSIS — K851 Biliary acute pancreatitis without necrosis or infection: Secondary | ICD-10-CM | POA: Diagnosis not present

## 2018-07-08 DIAGNOSIS — C218 Malignant neoplasm of overlapping sites of rectum, anus and anal canal: Secondary | ICD-10-CM | POA: Diagnosis not present

## 2018-07-08 DIAGNOSIS — K4090 Unilateral inguinal hernia, without obstruction or gangrene, not specified as recurrent: Secondary | ICD-10-CM | POA: Diagnosis not present

## 2018-07-08 DIAGNOSIS — C7800 Secondary malignant neoplasm of unspecified lung: Secondary | ICD-10-CM | POA: Diagnosis not present

## 2018-07-08 DIAGNOSIS — R918 Other nonspecific abnormal finding of lung field: Secondary | ICD-10-CM | POA: Diagnosis not present

## 2018-07-11 DIAGNOSIS — C218 Malignant neoplasm of overlapping sites of rectum, anus and anal canal: Secondary | ICD-10-CM | POA: Diagnosis not present

## 2018-07-11 DIAGNOSIS — C7800 Secondary malignant neoplasm of unspecified lung: Secondary | ICD-10-CM | POA: Diagnosis not present

## 2018-07-18 DIAGNOSIS — R0600 Dyspnea, unspecified: Secondary | ICD-10-CM | POA: Diagnosis not present

## 2018-07-18 DIAGNOSIS — C218 Malignant neoplasm of overlapping sites of rectum, anus and anal canal: Secondary | ICD-10-CM | POA: Diagnosis not present

## 2018-07-18 DIAGNOSIS — R42 Dizziness and giddiness: Secondary | ICD-10-CM | POA: Diagnosis not present

## 2018-07-18 DIAGNOSIS — C7800 Secondary malignant neoplasm of unspecified lung: Secondary | ICD-10-CM | POA: Diagnosis not present

## 2018-07-21 DIAGNOSIS — E7800 Pure hypercholesterolemia, unspecified: Secondary | ICD-10-CM | POA: Diagnosis not present

## 2018-07-21 DIAGNOSIS — I10 Essential (primary) hypertension: Secondary | ICD-10-CM | POA: Diagnosis not present

## 2018-07-21 DIAGNOSIS — E1165 Type 2 diabetes mellitus with hyperglycemia: Secondary | ICD-10-CM | POA: Diagnosis not present

## 2018-07-21 DIAGNOSIS — Z6825 Body mass index (BMI) 25.0-25.9, adult: Secondary | ICD-10-CM | POA: Diagnosis not present

## 2018-08-08 DIAGNOSIS — G620 Drug-induced polyneuropathy: Secondary | ICD-10-CM | POA: Insufficient documentation

## 2018-08-08 DIAGNOSIS — Z515 Encounter for palliative care: Secondary | ICD-10-CM | POA: Diagnosis not present

## 2018-08-08 DIAGNOSIS — C7800 Secondary malignant neoplasm of unspecified lung: Secondary | ICD-10-CM | POA: Diagnosis not present

## 2018-08-08 DIAGNOSIS — C218 Malignant neoplasm of overlapping sites of rectum, anus and anal canal: Secondary | ICD-10-CM | POA: Diagnosis not present

## 2018-08-08 DIAGNOSIS — T451X5A Adverse effect of antineoplastic and immunosuppressive drugs, initial encounter: Secondary | ICD-10-CM | POA: Insufficient documentation

## 2018-08-10 DIAGNOSIS — Z452 Encounter for adjustment and management of vascular access device: Secondary | ICD-10-CM | POA: Diagnosis not present

## 2018-08-10 DIAGNOSIS — C218 Malignant neoplasm of overlapping sites of rectum, anus and anal canal: Secondary | ICD-10-CM | POA: Diagnosis not present

## 2018-08-10 DIAGNOSIS — C7800 Secondary malignant neoplasm of unspecified lung: Secondary | ICD-10-CM | POA: Diagnosis not present

## 2018-08-29 DIAGNOSIS — C218 Malignant neoplasm of overlapping sites of rectum, anus and anal canal: Secondary | ICD-10-CM | POA: Diagnosis not present

## 2018-08-29 DIAGNOSIS — D696 Thrombocytopenia, unspecified: Secondary | ICD-10-CM | POA: Diagnosis not present

## 2018-08-29 DIAGNOSIS — Z515 Encounter for palliative care: Secondary | ICD-10-CM | POA: Diagnosis not present

## 2018-08-29 DIAGNOSIS — Z23 Encounter for immunization: Secondary | ICD-10-CM | POA: Diagnosis not present

## 2018-08-29 DIAGNOSIS — E871 Hypo-osmolality and hyponatremia: Secondary | ICD-10-CM | POA: Diagnosis not present

## 2018-08-29 DIAGNOSIS — C7800 Secondary malignant neoplasm of unspecified lung: Secondary | ICD-10-CM | POA: Diagnosis not present

## 2018-08-31 DIAGNOSIS — C218 Malignant neoplasm of overlapping sites of rectum, anus and anal canal: Secondary | ICD-10-CM | POA: Diagnosis not present

## 2018-08-31 DIAGNOSIS — Z452 Encounter for adjustment and management of vascular access device: Secondary | ICD-10-CM | POA: Diagnosis not present

## 2018-09-19 DIAGNOSIS — C7800 Secondary malignant neoplasm of unspecified lung: Secondary | ICD-10-CM | POA: Diagnosis not present

## 2018-09-19 DIAGNOSIS — C218 Malignant neoplasm of overlapping sites of rectum, anus and anal canal: Secondary | ICD-10-CM | POA: Diagnosis not present

## 2018-09-23 DIAGNOSIS — Z9181 History of falling: Secondary | ICD-10-CM | POA: Diagnosis not present

## 2018-09-23 DIAGNOSIS — Z0000 Encounter for general adult medical examination without abnormal findings: Secondary | ICD-10-CM | POA: Diagnosis not present

## 2018-09-23 DIAGNOSIS — E785 Hyperlipidemia, unspecified: Secondary | ICD-10-CM | POA: Diagnosis not present

## 2018-09-23 DIAGNOSIS — E1165 Type 2 diabetes mellitus with hyperglycemia: Secondary | ICD-10-CM | POA: Diagnosis not present

## 2018-09-23 DIAGNOSIS — Z6825 Body mass index (BMI) 25.0-25.9, adult: Secondary | ICD-10-CM | POA: Diagnosis not present

## 2018-10-07 DIAGNOSIS — C787 Secondary malignant neoplasm of liver and intrahepatic bile duct: Secondary | ICD-10-CM | POA: Diagnosis not present

## 2018-10-07 DIAGNOSIS — C218 Malignant neoplasm of overlapping sites of rectum, anus and anal canal: Secondary | ICD-10-CM | POA: Diagnosis not present

## 2018-10-07 DIAGNOSIS — C7800 Secondary malignant neoplasm of unspecified lung: Secondary | ICD-10-CM | POA: Diagnosis not present

## 2018-10-10 DIAGNOSIS — G620 Drug-induced polyneuropathy: Secondary | ICD-10-CM

## 2018-10-10 DIAGNOSIS — E1165 Type 2 diabetes mellitus with hyperglycemia: Secondary | ICD-10-CM | POA: Diagnosis not present

## 2018-10-10 DIAGNOSIS — E7800 Pure hypercholesterolemia, unspecified: Secondary | ICD-10-CM | POA: Diagnosis not present

## 2018-10-10 DIAGNOSIS — C218 Malignant neoplasm of overlapping sites of rectum, anus and anal canal: Secondary | ICD-10-CM | POA: Diagnosis not present

## 2018-10-10 DIAGNOSIS — C7800 Secondary malignant neoplasm of unspecified lung: Secondary | ICD-10-CM | POA: Diagnosis not present

## 2018-10-12 DIAGNOSIS — C218 Malignant neoplasm of overlapping sites of rectum, anus and anal canal: Secondary | ICD-10-CM | POA: Diagnosis not present

## 2018-10-12 DIAGNOSIS — C7800 Secondary malignant neoplasm of unspecified lung: Secondary | ICD-10-CM | POA: Diagnosis not present

## 2018-10-12 DIAGNOSIS — Z452 Encounter for adjustment and management of vascular access device: Secondary | ICD-10-CM | POA: Diagnosis not present

## 2018-10-24 DIAGNOSIS — C7800 Secondary malignant neoplasm of unspecified lung: Secondary | ICD-10-CM | POA: Diagnosis not present

## 2018-10-24 DIAGNOSIS — C218 Malignant neoplasm of overlapping sites of rectum, anus and anal canal: Secondary | ICD-10-CM | POA: Diagnosis not present

## 2018-11-07 DIAGNOSIS — D6959 Other secondary thrombocytopenia: Secondary | ICD-10-CM | POA: Diagnosis not present

## 2018-11-07 DIAGNOSIS — C7802 Secondary malignant neoplasm of left lung: Secondary | ICD-10-CM | POA: Diagnosis not present

## 2018-11-07 DIAGNOSIS — C7800 Secondary malignant neoplasm of unspecified lung: Secondary | ICD-10-CM | POA: Diagnosis not present

## 2018-11-07 DIAGNOSIS — C218 Malignant neoplasm of overlapping sites of rectum, anus and anal canal: Secondary | ICD-10-CM | POA: Diagnosis not present

## 2018-11-07 DIAGNOSIS — C7801 Secondary malignant neoplasm of right lung: Secondary | ICD-10-CM | POA: Diagnosis not present

## 2018-11-09 DIAGNOSIS — Z452 Encounter for adjustment and management of vascular access device: Secondary | ICD-10-CM | POA: Diagnosis not present

## 2018-11-09 DIAGNOSIS — C7800 Secondary malignant neoplasm of unspecified lung: Secondary | ICD-10-CM | POA: Diagnosis not present

## 2018-11-09 DIAGNOSIS — C218 Malignant neoplasm of overlapping sites of rectum, anus and anal canal: Secondary | ICD-10-CM | POA: Diagnosis not present

## 2018-11-28 DIAGNOSIS — C218 Malignant neoplasm of overlapping sites of rectum, anus and anal canal: Secondary | ICD-10-CM | POA: Diagnosis not present

## 2018-11-28 DIAGNOSIS — M25551 Pain in right hip: Secondary | ICD-10-CM | POA: Diagnosis not present

## 2018-11-28 DIAGNOSIS — M1611 Unilateral primary osteoarthritis, right hip: Secondary | ICD-10-CM | POA: Diagnosis not present

## 2018-11-28 DIAGNOSIS — C7800 Secondary malignant neoplasm of unspecified lung: Secondary | ICD-10-CM | POA: Diagnosis not present

## 2018-11-30 DIAGNOSIS — C218 Malignant neoplasm of overlapping sites of rectum, anus and anal canal: Secondary | ICD-10-CM | POA: Diagnosis not present

## 2018-11-30 DIAGNOSIS — Z452 Encounter for adjustment and management of vascular access device: Secondary | ICD-10-CM | POA: Diagnosis not present

## 2018-12-12 DIAGNOSIS — C7801 Secondary malignant neoplasm of right lung: Secondary | ICD-10-CM | POA: Diagnosis not present

## 2018-12-12 DIAGNOSIS — C7802 Secondary malignant neoplasm of left lung: Secondary | ICD-10-CM | POA: Diagnosis not present

## 2018-12-12 DIAGNOSIS — C7800 Secondary malignant neoplasm of unspecified lung: Secondary | ICD-10-CM | POA: Diagnosis not present

## 2018-12-12 DIAGNOSIS — D696 Thrombocytopenia, unspecified: Secondary | ICD-10-CM | POA: Diagnosis not present

## 2018-12-12 DIAGNOSIS — C218 Malignant neoplasm of overlapping sites of rectum, anus and anal canal: Secondary | ICD-10-CM | POA: Diagnosis not present

## 2018-12-12 DIAGNOSIS — Z515 Encounter for palliative care: Secondary | ICD-10-CM | POA: Diagnosis not present

## 2018-12-12 DIAGNOSIS — G629 Polyneuropathy, unspecified: Secondary | ICD-10-CM | POA: Diagnosis not present

## 2018-12-14 DIAGNOSIS — C7800 Secondary malignant neoplasm of unspecified lung: Secondary | ICD-10-CM | POA: Diagnosis not present

## 2018-12-14 DIAGNOSIS — C218 Malignant neoplasm of overlapping sites of rectum, anus and anal canal: Secondary | ICD-10-CM | POA: Diagnosis not present

## 2018-12-14 DIAGNOSIS — Z452 Encounter for adjustment and management of vascular access device: Secondary | ICD-10-CM | POA: Diagnosis not present

## 2018-12-26 DIAGNOSIS — E871 Hypo-osmolality and hyponatremia: Secondary | ICD-10-CM | POA: Diagnosis not present

## 2018-12-26 DIAGNOSIS — M25551 Pain in right hip: Secondary | ICD-10-CM | POA: Diagnosis not present

## 2018-12-26 DIAGNOSIS — G629 Polyneuropathy, unspecified: Secondary | ICD-10-CM | POA: Diagnosis not present

## 2018-12-26 DIAGNOSIS — C7800 Secondary malignant neoplasm of unspecified lung: Secondary | ICD-10-CM | POA: Diagnosis not present

## 2018-12-26 DIAGNOSIS — C218 Malignant neoplasm of overlapping sites of rectum, anus and anal canal: Secondary | ICD-10-CM | POA: Diagnosis not present

## 2018-12-28 DIAGNOSIS — C218 Malignant neoplasm of overlapping sites of rectum, anus and anal canal: Secondary | ICD-10-CM | POA: Diagnosis not present

## 2018-12-28 DIAGNOSIS — Z452 Encounter for adjustment and management of vascular access device: Secondary | ICD-10-CM | POA: Diagnosis not present

## 2019-01-04 DIAGNOSIS — E7800 Pure hypercholesterolemia, unspecified: Secondary | ICD-10-CM | POA: Diagnosis not present

## 2019-01-04 DIAGNOSIS — I10 Essential (primary) hypertension: Secondary | ICD-10-CM | POA: Diagnosis not present

## 2019-01-04 DIAGNOSIS — E1165 Type 2 diabetes mellitus with hyperglycemia: Secondary | ICD-10-CM | POA: Diagnosis not present

## 2019-01-04 DIAGNOSIS — C20 Malignant neoplasm of rectum: Secondary | ICD-10-CM | POA: Diagnosis not present

## 2019-01-06 DIAGNOSIS — R161 Splenomegaly, not elsewhere classified: Secondary | ICD-10-CM | POA: Diagnosis not present

## 2019-01-06 DIAGNOSIS — C20 Malignant neoplasm of rectum: Secondary | ICD-10-CM | POA: Diagnosis not present

## 2019-01-06 DIAGNOSIS — C218 Malignant neoplasm of overlapping sites of rectum, anus and anal canal: Secondary | ICD-10-CM | POA: Diagnosis not present

## 2019-01-06 DIAGNOSIS — C7802 Secondary malignant neoplasm of left lung: Secondary | ICD-10-CM | POA: Diagnosis not present

## 2019-01-06 DIAGNOSIS — C7801 Secondary malignant neoplasm of right lung: Secondary | ICD-10-CM | POA: Diagnosis not present

## 2019-01-06 DIAGNOSIS — R918 Other nonspecific abnormal finding of lung field: Secondary | ICD-10-CM | POA: Diagnosis not present

## 2019-01-09 DIAGNOSIS — C218 Malignant neoplasm of overlapping sites of rectum, anus and anal canal: Secondary | ICD-10-CM | POA: Diagnosis not present

## 2019-01-09 DIAGNOSIS — G629 Polyneuropathy, unspecified: Secondary | ICD-10-CM | POA: Diagnosis not present

## 2019-01-09 DIAGNOSIS — C7800 Secondary malignant neoplasm of unspecified lung: Secondary | ICD-10-CM | POA: Diagnosis not present

## 2019-01-09 DIAGNOSIS — R040 Epistaxis: Secondary | ICD-10-CM | POA: Diagnosis not present

## 2019-01-09 DIAGNOSIS — E871 Hypo-osmolality and hyponatremia: Secondary | ICD-10-CM | POA: Diagnosis not present

## 2019-01-09 DIAGNOSIS — R161 Splenomegaly, not elsewhere classified: Secondary | ICD-10-CM | POA: Diagnosis not present

## 2019-01-10 ENCOUNTER — Encounter: Payer: Self-pay | Admitting: Gastroenterology

## 2019-01-10 ENCOUNTER — Ambulatory Visit (INDEPENDENT_AMBULATORY_CARE_PROVIDER_SITE_OTHER): Payer: Medicare Other | Admitting: Gastroenterology

## 2019-01-10 VITALS — BP 136/72 | HR 66 | Ht 70.5 in | Wt 186.1 lb

## 2019-01-10 DIAGNOSIS — K625 Hemorrhage of anus and rectum: Secondary | ICD-10-CM | POA: Diagnosis not present

## 2019-01-10 DIAGNOSIS — Z85048 Personal history of other malignant neoplasm of rectum, rectosigmoid junction, and anus: Secondary | ICD-10-CM | POA: Diagnosis not present

## 2019-01-10 NOTE — Patient Instructions (Signed)
If you are age 71 or older, your body mass index should be between 23-30. Your Body mass index is 26.33 kg/m. If this is out of the aforementioned range listed, please consider follow up with your Primary Care Provider.  If you are age 13 or younger, your body mass index should be between 19-25. Your Body mass index is 26.33 kg/m. If this is out of the aformentioned range listed, please consider follow up with your Primary Care Provider.   You have been scheduled for a flexible sigmoidoscopy. Please follow the written instructions given to you at your visit today. If you use inhalers (even only as needed), please bring them with you on the day of your procedure.  Thank you,  Dr. Jackquline Denmark

## 2019-01-10 NOTE — Progress Notes (Signed)
Chief Complaint: rectal bleeding  Referring Provider:  Cyndy Freeze, MD      ASSESSMENT AND PLAN;   #1. Rectal bleeding. Nl Hb 14 (12/2018). But plts 70-80K d/t chemo. D/d includes recurrent (or residual) rectal adenocarcinoma, radiation proctitis or hemorrhoids.  #2.  Stage IV rectal cancer (Dx 02/2017) with mets to lung.  Currently on palliative 5-FU/leucovorin/bevacizumab q2weeky. Had 4500 + 540 Gy XRT). CT 12/2018 showing mild rectal wall thickening, mod R inguinal hernia, multiple B/L pulmonary metastases.  Plan: -Recommend flexible sigmoidoscopy for further evaluation to determine etiology of rectal bleeding.  If APC is needed, will perform. -Check CBC, day of FS.  If platelet count is less than 100, transfuse 2U platelets an hour before the procedure. -Start stool softeners 1/day.  Patient already has these. -D/w patient and patient's wife. -D/w Dr. Hinton Rao yesterday.   HPI:    Dakota Bennett is a 73 y.o. male  With stage IV rectosigmoid junction adenocarcinoma Dx 02/2017 Currently on palliative 5-FU/leucovorin/bevacizumab q2weeky.  Had neoadjuvant radiation With intermittent rectal bleeding over the last 3 months He did bring in a picture showing few clots Does have constipation with passage of pellet-like stools. Hemoglobin has been stable Platelets have been between 70-80 K lately. No nonsteroidals. CT scan chest/abdomen/pelvis showed thickening of the rectum (cannot rule out recurrence), multiple pulmonary nodules and moderate right inguinal hernia.   Past Medical History:  Diagnosis Date  . Allergic rhinitis   . Cancer ([place])    rectal cancer and found spots on his lungs also and is on chemo currently   . Diabetes mellitus without complication ([place])    type 2  . GERD (gastroesophageal reflux disease)   . Headache    hx of migraines yrs ago  . Hyperlipidemia   . Hypertension   . MVP (mitral valve prolapse)    dx 42 yrs no cardiologist    Past Surgical  History:  Procedure Laterality Date  . COLONOSCOPY  03/03/2017   Proximal rectal mass (biopsied). Transverse colonic polyps status post polypectomy. Mild sigmoid diverticulosis  . EUS N/A 03/18/2017   Procedure: LOWER ENDOSCOPIC ULTRASOUND (EUS);  Surgeon: Milus Banister, MD;  Location: [facility];  Service: Endoscopy;  Laterality: N/A;  . extensive dental work    . ingrown toenail removed  age 89 or 84    Family History  Problem Relation Age of Onset  . Colon cancer Neg Hx   . Esophageal cancer Neg Hx     Social History   Tobacco Use  . Smoking status: Former Smoker    Packs/day: 2.00    Years: 20.00    Pack years: 40.00    Types: Cigarettes  . Smokeless tobacco: Former Systems developer    Quit date: 11/23/1998  . Tobacco comment: quit 2000  Substance Use Topics  . Alcohol use: No  . Drug use: No    Current Outpatient Medications  Medication Sig Dispense Refill  . AMBULATORY NON FORMULARY MEDICATION Currently on 3 chemo medication  and have a pump that is connected for 2 days. Pump bag is fluorouracil 3500mg  CIV mixed with sodium chloride 0.9%    . famotidine (PEPCID) 20 MG tablet Take 20 mg by mouth as needed for heartburn or indigestion.    . gabapentin (NEURONTIN) 300 MG capsule Take 300 mg by mouth at bedtime.    . hydrochlorothiazide (HYDRODIURIL) 25 MG tablet 1 tablet daily.    . magnesium oxide (MAG-OX) 400 MG tablet Take 1 tablet by mouth daily.    Marland Kitchen  metFORMIN (GLUCOPHAGE-XR) 500 MG 24 hr tablet Take 1,000 mg by mouth daily with breakfast.     . metoprolol succinate (TOPROL-XL) 100 MG 24 hr tablet Take 100 mg by mouth daily. Take with or immediately following a meal.    . ondansetron (ZOFRAN) 4 MG tablet Take 4 mg by mouth as needed for nausea or vomiting.    . potassium chloride (K-DUR) 10 MEQ tablet Take 10 mEq by mouth daily.    . psyllium (METAMUCIL) 58.6 % packet Take 1 packet by mouth as needed.     Marland Kitchen spironolactone (ALDACTONE) 25 MG tablet Take 25 mg by mouth daily.       No current facility-administered medications for this visit.     Allergies  Allergen Reactions  . Aspirin Other (See Comments)    Burns stomach   . Lisinopril     Hives   . Adhesive [Tape] Rash    Bandaids  . Neosporin [Neomycin-Bacitracin Zn-Polymyx] Rash    Review of Systems:  neg     Physical Exam:    BP 136/72   Pulse 66   Ht 5' 10.5" (1.791 m)   Wt 186 lb 2 oz (84.4 kg)   BMI 26.33 kg/m  Filed Weights   01/10/19 1439  Weight: 186 lb 2 oz (84.4 kg)   Constitutional:  Well-developed, in no acute distress. Psychiatric: Normal mood and affect. Behavior is normal. HEENT: Pupils normal.  Conjunctivae are normal. No scleral icterus. Neck supple.  Cardiovascular: Normal rate, regular rhythm. No edema Pulmonary/chest: Effort normal and breath sounds normal. No wheezing, rales or rhonchi. Abdominal: Soft, nondistended. Nontender. Bowel sounds active throughout. There are no masses palpable. No hepatomegaly. Rectal: To be performed at the time of flex sig Neurological: Alert and oriented to person place and time. Skin: Skin is warm and dry. No rashes noted.    Carmell Austria, MD 01/10/2019, 3:09 PM  Cc: Cyndy Freeze, MD  Dr Hinton Rao

## 2019-01-11 DIAGNOSIS — C7800 Secondary malignant neoplasm of unspecified lung: Secondary | ICD-10-CM | POA: Diagnosis not present

## 2019-01-11 DIAGNOSIS — C218 Malignant neoplasm of overlapping sites of rectum, anus and anal canal: Secondary | ICD-10-CM | POA: Diagnosis not present

## 2019-01-11 DIAGNOSIS — Z452 Encounter for adjustment and management of vascular access device: Secondary | ICD-10-CM | POA: Diagnosis not present

## 2019-01-23 DIAGNOSIS — C250 Malignant neoplasm of head of pancreas: Secondary | ICD-10-CM | POA: Diagnosis not present

## 2019-01-23 DIAGNOSIS — C218 Malignant neoplasm of overlapping sites of rectum, anus and anal canal: Secondary | ICD-10-CM | POA: Diagnosis not present

## 2019-01-23 DIAGNOSIS — C7800 Secondary malignant neoplasm of unspecified lung: Secondary | ICD-10-CM | POA: Diagnosis not present

## 2019-02-06 DIAGNOSIS — C218 Malignant neoplasm of overlapping sites of rectum, anus and anal canal: Secondary | ICD-10-CM | POA: Diagnosis not present

## 2019-02-06 DIAGNOSIS — C7800 Secondary malignant neoplasm of unspecified lung: Secondary | ICD-10-CM | POA: Diagnosis not present

## 2019-02-06 DIAGNOSIS — K625 Hemorrhage of anus and rectum: Secondary | ICD-10-CM | POA: Diagnosis not present

## 2019-02-06 DIAGNOSIS — D696 Thrombocytopenia, unspecified: Secondary | ICD-10-CM | POA: Diagnosis not present

## 2019-02-15 ENCOUNTER — Telehealth: Payer: Self-pay | Admitting: Gastroenterology

## 2019-02-15 NOTE — Telephone Encounter (Signed)
Pt clled wanting to resched his hsp appt that is sched for 02/21/2019

## 2019-02-16 NOTE — Telephone Encounter (Signed)
Patients wife states he has decided to keep his appt as scheduled.

## 2019-02-20 ENCOUNTER — Telehealth: Payer: Self-pay | Admitting: Gastroenterology

## 2019-02-20 DIAGNOSIS — G620 Drug-induced polyneuropathy: Secondary | ICD-10-CM | POA: Diagnosis not present

## 2019-02-20 DIAGNOSIS — E871 Hypo-osmolality and hyponatremia: Secondary | ICD-10-CM | POA: Diagnosis not present

## 2019-02-20 DIAGNOSIS — C7800 Secondary malignant neoplasm of unspecified lung: Secondary | ICD-10-CM | POA: Diagnosis not present

## 2019-02-20 DIAGNOSIS — D696 Thrombocytopenia, unspecified: Secondary | ICD-10-CM | POA: Diagnosis not present

## 2019-02-20 DIAGNOSIS — E876 Hypokalemia: Secondary | ICD-10-CM | POA: Diagnosis not present

## 2019-02-20 DIAGNOSIS — K219 Gastro-esophageal reflux disease without esophagitis: Secondary | ICD-10-CM | POA: Diagnosis not present

## 2019-02-20 DIAGNOSIS — C218 Malignant neoplasm of overlapping sites of rectum, anus and anal canal: Secondary | ICD-10-CM | POA: Diagnosis not present

## 2019-02-20 NOTE — Telephone Encounter (Signed)
Santiago Glad at [facility] has the order for the CBC in. Ordered STAT. He is scheduled for 02/21/19. You may have this on your radar. Just wanted to reach out to you in case you did not.

## 2019-02-20 NOTE — Telephone Encounter (Signed)
Had it done

## 2019-02-20 NOTE — Addendum Note (Signed)
Addended by: Virgina Evener A on: 02/20/2019 02:38 PM   Modules accepted: Orders

## 2019-02-20 NOTE — Progress Notes (Signed)
Called patient day prior to procedure scheduled. Talked with patient's wife. She denied that her or her husband have had a fever, SOB, muscle aches or cold symptoms within the past 2 weeks. She denied that either of them had traveled outside of the state of [place] in the last 2 weeks and that neither of them had been diagnosed with Covid 19 nor tested.

## 2019-02-20 NOTE — Telephone Encounter (Signed)
Santiago Glad from [place] calling regarding procedure for pt tomorrow, She stated that she read in Dr. Jacquenette Shone notes that pt needs some labs the day of his procedure but she did not see orders for that. She would like a cb to clarify.

## 2019-02-21 ENCOUNTER — Encounter (HOSPITAL_COMMUNITY): Payer: Self-pay

## 2019-02-21 ENCOUNTER — Other Ambulatory Visit: Payer: Self-pay

## 2019-02-21 ENCOUNTER — Ambulatory Visit (HOSPITAL_BASED_OUTPATIENT_CLINIC_OR_DEPARTMENT_OTHER)
Admission: RE | Admit: 2019-02-21 | Discharge: 2019-02-21 | Disposition: A | Discharge status: Home / Self Care | Payer: Medicare Other | Source: Home / Self Care | Attending: Gastroenterology | Admitting: Gastroenterology

## 2019-02-21 ENCOUNTER — Encounter (HOSPITAL_COMMUNITY)
Admission: RE | Disposition: A | Discharge status: Home / Self Care | Payer: Self-pay | Source: Home / Self Care | Attending: Gastroenterology

## 2019-02-21 DIAGNOSIS — I10 Essential (primary) hypertension: Secondary | ICD-10-CM

## 2019-02-21 DIAGNOSIS — Z886 Allergy status to analgesic agent status: Secondary | ICD-10-CM

## 2019-02-21 DIAGNOSIS — C7802 Secondary malignant neoplasm of left lung: Secondary | ICD-10-CM | POA: Insufficient documentation

## 2019-02-21 DIAGNOSIS — Z87891 Personal history of nicotine dependence: Secondary | ICD-10-CM | POA: Insufficient documentation

## 2019-02-21 DIAGNOSIS — C20 Malignant neoplasm of rectum: Secondary | ICD-10-CM

## 2019-02-21 DIAGNOSIS — Z888 Allergy status to other drugs, medicaments and biological substances status: Secondary | ICD-10-CM | POA: Insufficient documentation

## 2019-02-21 DIAGNOSIS — K219 Gastro-esophageal reflux disease without esophagitis: Secondary | ICD-10-CM | POA: Insufficient documentation

## 2019-02-21 DIAGNOSIS — K5520 Angiodysplasia of colon without hemorrhage: Secondary | ICD-10-CM | POA: Insufficient documentation

## 2019-02-21 DIAGNOSIS — K627 Radiation proctitis: Secondary | ICD-10-CM | POA: Insufficient documentation

## 2019-02-21 DIAGNOSIS — K921 Melena: Secondary | ICD-10-CM

## 2019-02-21 DIAGNOSIS — Z79899 Other long term (current) drug therapy: Secondary | ICD-10-CM

## 2019-02-21 DIAGNOSIS — E119 Type 2 diabetes mellitus without complications: Secondary | ICD-10-CM | POA: Insufficient documentation

## 2019-02-21 DIAGNOSIS — Z7984 Long term (current) use of oral hypoglycemic drugs: Secondary | ICD-10-CM

## 2019-02-21 DIAGNOSIS — K625 Hemorrhage of anus and rectum: Secondary | ICD-10-CM

## 2019-02-21 DIAGNOSIS — K648 Other hemorrhoids: Secondary | ICD-10-CM

## 2019-02-21 DIAGNOSIS — Z85048 Personal history of other malignant neoplasm of rectum, rectosigmoid junction, and anus: Secondary | ICD-10-CM

## 2019-02-21 HISTORY — PX: FLEXIBLE SIGMOIDOSCOPY: SHX5431

## 2019-02-21 LAB — CBC
HCT: 46.8 % (ref 39.0–52.0)
Hemoglobin: 14.7 g/dL (ref 13.0–17.0)
MCH: 30.2 pg (ref 26.0–34.0)
MCHC: 31.4 g/dL (ref 30.0–36.0)
MCV: 96.3 fL (ref 80.0–100.0)
Platelets: 87 10*3/uL — ABNORMAL LOW (ref 150–400)
RBC: 4.86 MIL/uL (ref 4.22–5.81)
RDW: 17.2 % — ABNORMAL HIGH (ref 11.5–15.5)
WBC: 6 10*3/uL (ref 4.0–10.5)
nRBC: 0 % (ref 0.0–0.2)

## 2019-02-21 LAB — GLUCOSE, CAPILLARY: Glucose-Capillary: 126 mg/dL — ABNORMAL HIGH (ref 70–99)

## 2019-02-21 SURGERY — SIGMOIDOSCOPY, FLEXIBLE
Anesthesia: Moderate Sedation

## 2019-02-21 MED ORDER — HEPARIN SOD (PORK) LOCK FLUSH 100 UNIT/ML IV SOLN
500.0000 [IU] | Freq: Once | INTRAVENOUS | Status: AC
  Administered 2019-02-21: 500 [IU] via INTRAVENOUS

## 2019-02-21 MED ORDER — FENTANYL CITRATE (PF) 100 MCG/2ML IJ SOLN
INTRAMUSCULAR | Status: AC
  Filled 2019-02-21: qty 2

## 2019-02-21 MED ORDER — SODIUM CHLORIDE 0.9 % IV SOLN
INTRAVENOUS | Status: DC
  Administered 2019-02-21: 500 mL via INTRAVENOUS

## 2019-02-21 MED ORDER — MIDAZOLAM HCL (PF) 10 MG/2ML IJ SOLN
INTRAMUSCULAR | Status: DC | PRN
  Administered 2019-02-21: 2 mg via INTRAVENOUS

## 2019-02-21 MED ORDER — HEPARIN SOD (PORK) LOCK FLUSH 100 UNIT/ML IV SOLN
INTRAVENOUS | Status: AC
  Filled 2019-02-21: qty 5

## 2019-02-21 MED ORDER — FENTANYL CITRATE (PF) 100 MCG/2ML IJ SOLN
INTRAMUSCULAR | Status: DC | PRN
  Administered 2019-02-21: 25 ug via INTRAVENOUS

## 2019-02-21 MED ORDER — MIDAZOLAM HCL (PF) 5 MG/ML IJ SOLN
INTRAMUSCULAR | Status: AC
  Filled 2019-02-21: qty 2

## 2019-02-21 NOTE — Discharge Instructions (Signed)
YOU HAD AN ENDOSCOPIC PROCEDURE TODAY: Refer to the procedure report and other information in the discharge instructions given to you for any specific questions about what was found during the examination. If this information does not answer your questions, please call [place] office at [phone] to clarify.   YOU SHOULD EXPECT: Some feelings of bloating in the abdomen. Passage of more gas than usual. Walking can help get rid of the air that was put into your GI tract during the procedure and reduce the bloating. If you had a lower endoscopy (such as a colonoscopy or flexible sigmoidoscopy) you may notice spotting of blood in your stool or on the toilet paper. Some abdominal soreness may be present for a day or two, also.  DIET: Your first meal following the procedure should be a light meal and then it is ok to progress to your normal diet. A half-sandwich or bowl of soup is an example of a good first meal. Heavy or fried foods are harder to digest and may make you feel nauseous or bloated. Drink plenty of fluids but you should avoid alcoholic beverages for 24 hours.  ACTIVITY: Your care partner should take you home directly after the procedure. You should plan to take it easy, moving slowly for the rest of the day. You can resume normal activity the day after the procedure however YOU SHOULD NOT DRIVE, use power tools, machinery or perform tasks that involve climbing or major physical exertion for 24 hours (because of the sedation medicines used during the test).   SYMPTOMS TO REPORT IMMEDIATELY: A gastroenterologist can be reached at any hour. Please call [phone]  for any of the following symptoms:  Following lower endoscopy (colonoscopy, flexible sigmoidoscopy) Excessive amounts of blood in the stool  Significant tenderness, worsening of abdominal pains  Swelling of the abdomen that is new, acute  Fever of 100 or higher   FOLLOW UP:  If any biopsies were taken you will be contacted by  phone or by letter within the next 1-3 weeks. Call [phone]  if you have not heard about the biopsies in 3 weeks.  Please also call with any specific questions about appointments or follow up tests. YOU HAD AN ENDOSCOPIC PROCEDURE TODAY: Refer to the procedure report and other information in the discharge instructions given to you for any specific questions about what was found during the examination. If this information does not answer your questions, please call [place] office at [phone] to clarify.   YOU SHOULD EXPECT: Some feelings of bloating in the abdomen. Passage of more gas than usual. Walking can help get rid of the air that was put into your GI tract during the procedure and reduce the bloating. If you had a lower endoscopy (such as a colonoscopy or flexible sigmoidoscopy) you may notice spotting of blood in your stool or on the toilet paper. Some abdominal soreness may be present for a day or two, also.  DIET: Your first meal following the procedure should be a light meal and then it is ok to progress to your normal diet. A half-sandwich or bowl of soup is an example of a good first meal. Heavy or fried foods are harder to digest and may make you feel nauseous or bloated. Drink plenty of fluids but you should avoid alcoholic beverages for 24 hours. If you had a esophageal dilation, please see attached instructions for diet.    ACTIVITY: Your care partner should take you home directly after the procedure. You should plan  to take it easy, moving slowly for the rest of the day. You can resume normal activity the day after the procedure however YOU SHOULD NOT DRIVE, use power tools, machinery or perform tasks that involve climbing or major physical exertion for 24 hours (because of the sedation medicines used during the test).   SYMPTOMS TO REPORT IMMEDIATELY: A gastroenterologist can be reached at any hour. Please call [phone]  for any of the following symptoms:  Following lower  endoscopy (colonoscopy, flexible sigmoidoscopy) Excessive amounts of blood in the stool  Significant tenderness, worsening of abdominal pains  Swelling of the abdomen that is new, acute  Fever of 100 or higher   FOLLOW UP:  If any biopsies were taken you will be contacted by phone or by letter within the next 1-3 weeks. Call [phone]  if you have not heard about the biopsies in 3 weeks.  Please also call with any specific questions about appointments or follow up tests.

## 2019-02-21 NOTE — Op Note (Signed)
[facility] Patient Name: Dakota Bennett Procedure Date: 02/21/2019 MRN: [id number] Attending MD: Jackquline Denmark , MD Date of Birth: [birth year] CSN: [id number] Age: 73 Admit Type: Outpatient Procedure:                Flexible Sigmoidoscopy Indications:              #1. Rectal bleeding. Nl Hb 14 (12/2018). But plts                            70-80K d/t chemo.                           #2. Stage IV rectal cancer (Dx 02/2017) with mets to                            lung. Currently on palliative                            5-FU/leucovorin/bevacizumab. Never had surgery. Providers:                Jackquline Denmark, MD, Cleda Daub, RN, Charolette Child,                            Technician Referring MD:             Dr Hinton Rao Medicines:                Fentanyl 25 micrograms IV, Midazolam 2 mg IV.                            Bevacizumab was held prior. Complications:            No immediate complications. Estimated Blood Loss:     Estimated blood loss: none. Procedure:                Pre-Anesthesia Assessment:                           - Prior to the procedure, a History and Physical                            was performed, and patient medications and                            allergies were reviewed. The patient's tolerance of                            previous anesthesia was also reviewed. The risks                            and benefits of the procedure and the sedation                            options and risks were discussed with the patient.                            All  questions were answered, and informed consent                            was obtained. Prior Anticoagulants: The patient has                            taken no previous anticoagulant or antiplatelet                            agents. ASA Grade Assessment: III - A patient with                            severe systemic disease. After reviewing the risks                            and benefits, the patient  was deemed in                            satisfactory condition to undergo the procedure.                           After obtaining informed consent, the scope was                            passed under direct vision. The PCF-H190DL                            ([id number]) Olympus pediatric colonscope was                            introduced through the anus and advanced to the the                            mid-sigmoid colon, 30 cm from the anal verge. There                            was some stool encountered. The flexible                            sigmoidoscopy was accomplished without difficulty.                            The patient tolerated the procedure well. The                            quality of the bowel preparation was good. Scope In: 12:14:30 PM Scope Out: 12:19:33 PM Total Procedure Duration: 0 hours 5 minutes 3 seconds  Findings:      A few small telangiectasia suggestive of mild radiation proctitis       without bleeding were found in the recto-sigmoid colon and in the       rectum. A scar was noted. No recurrence. Tattoo was visualized at 15 cm       from the anal verge.      Non-bleeding internal  hemorrhoids were found during retroflexion. The       hemorrhoids were small. Impression:               -Mild radiation-induced changes (no bleeding)                           -No recurrence of rectal cancer.                           -Small Non-bleeding internal hemorrhoids. Moderate Sedation:      as above Recommendation:           - Discharge patient to home (with spouse).                           - Resume previous diet.                           - Colace capsule(s) orally 100 mg daily.                           - We decided to hold off on APC treatment since the                            radiation changes were very mild and Hb was normal.                            He had low platelets today at 87K. However, if he                            has significant bleeding  in the future/decreases                            his hemoglobin, would consider APC treatment. I                            have discussed above in detail with the patient and                            patient's wife.                           -Can resume 5-FU/leucovorin/bevacizumab. Procedure Code(s):        --- Professional ---                           [id number], Sigmoidoscopy, flexible; diagnostic,                            including collection of specimen(s) by brushing or                            washing, when performed (separate procedure) Diagnosis Code(s):        --- Professional ---  K55.20, Angiodysplasia of colon without hemorrhage                           K64.8, Other hemorrhoids                           K92.1, Melena (includes Hematochezia) CPT copyright 2019 American Medical Association. All rights reserved. The codes documented in this report are preliminary and upon coder review may  be revised to meet current compliance requirements. Jackquline Denmark, MD 02/21/2019 12:42:14 PM This report has been signed electronically. Number of Addenda: 0

## 2019-02-21 NOTE — H&P (Addendum)
Chief Complaint: rectal bleeding  Referring Provider:  Cyndy Freeze, MD      ASSESSMENT AND PLAN;   #1. Rectal bleeding. Nl Hb 14 (12/2018). But plts 70-80K d/t chemo. D/d includes recurrent (or residual) rectal adenocarcinoma, radiation proctitis or hemorrhoids.  #2.  Stage IV rectal cancer (Dx 02/2017) with mets to lung.  Currently on palliative 5-FU/leucovorin/bevacizumab q2weeky. Had 4500 + 540 Gy XRT). CT 12/2018 showing mild rectal wall thickening, mod R inguinal hernia, multiple B/L pulmonary metastases.  Plan: -Recommend flexible sigmoidoscopy for further evaluation to determine etiology of rectal bleeding.  If APC is needed, will perform. -Check CBC, day of FS.  If platelet count is less than 50K, transfuse 2U platelets an hour before the procedure. -Start stool softeners 1/day.  Patient already has these. -D/w patient and patient's wife. -D/w Dr. Hinton Rao yesterday.   HPI:    Dakota Bennett is a 73 y.o. male  With stage IV rectosigmoid junction adenocarcinoma Dx 02/2017 Currently on palliative 5-FU/leucovorin/bevacizumab q2weeky.  Had neoadjuvant radiation With intermittent rectal bleeding over the last 3 months He did bring in a picture showing few clots Does have constipation with passage of pellet-like stools. Hemoglobin has been stable Platelets have been between 70-80 K lately. No nonsteroidals. CT scan chest/abdomen/pelvis showed thickening of the rectum (cannot rule out recurrence), multiple pulmonary nodules and moderate right inguinal hernia.       Past Medical History:  Diagnosis Date  . Allergic rhinitis   . Cancer ([place])    rectal cancer and found spots on his lungs also and is on chemo currently   . Diabetes mellitus without complication ([place])    type 2  . GERD (gastroesophageal reflux disease)   . Headache    hx of migraines yrs ago  . Hyperlipidemia   . Hypertension   . MVP (mitral valve prolapse)    dx 42 yrs no  cardiologist         Past Surgical History:  Procedure Laterality Date  . COLONOSCOPY  03/03/2017   Proximal rectal mass (biopsied). Transverse colonic polyps status post polypectomy. Mild sigmoid diverticulosis  . EUS N/A 03/18/2017   Procedure: LOWER ENDOSCOPIC ULTRASOUND (EUS);  Surgeon: Milus Banister, MD;  Location: [facility];  Service: Endoscopy;  Laterality: N/A;  . extensive dental work    . ingrown toenail removed  age 60 or 8         Family History  Problem Relation Age of Onset  . Colon cancer Neg Hx   . Esophageal cancer Neg Hx     Social History        Tobacco Use  . Smoking status: Former Smoker    Packs/day: 2.00    Years: 20.00    Pack years: 40.00    Types: Cigarettes  . Smokeless tobacco: Former Systems developer    Quit date: 11/23/1998  . Tobacco comment: quit 2000  Substance Use Topics  . Alcohol use: No  . Drug use: No          Current Outpatient Medications  Medication Sig Dispense Refill  . AMBULATORY NON FORMULARY MEDICATION Currently on 3 chemo medication  and have a pump that is connected for 2 days. Pump bag is fluorouracil 3500mg  CIV mixed with sodium chloride 0.9%    . famotidine (PEPCID) 20 MG tablet Take 20 mg by mouth as needed for heartburn or indigestion.    . gabapentin (NEURONTIN) 300 MG capsule Take 300 mg by mouth at bedtime.    Marland Kitchen  hydrochlorothiazide (HYDRODIURIL) 25 MG tablet 1 tablet daily.    . magnesium oxide (MAG-OX) 400 MG tablet Take 1 tablet by mouth daily.    . metFORMIN (GLUCOPHAGE-XR) 500 MG 24 hr tablet Take 1,000 mg by mouth daily with breakfast.     . metoprolol succinate (TOPROL-XL) 100 MG 24 hr tablet Take 100 mg by mouth daily. Take with or immediately following a meal.    . ondansetron (ZOFRAN) 4 MG tablet Take 4 mg by mouth as needed for nausea or vomiting.    . potassium chloride (K-DUR) 10 MEQ tablet Take 10 mEq by mouth daily.    . psyllium (METAMUCIL) 58.6 % packet Take 1  packet by mouth as needed.     Marland Kitchen spironolactone (ALDACTONE) 25 MG tablet Take 25 mg by mouth daily.     No current facility-administered medications for this visit.          Allergies  Allergen Reactions  . Aspirin Other (See Comments)    Burns stomach   . Lisinopril     Hives   . Adhesive [Tape] Rash    Bandaids  . Neosporin [Neomycin-Bacitracin Zn-Polymyx] Rash    Review of Systems:  neg     Physical Exam:    BP 136/72   Pulse 66   Ht 5' 10.5" (1.791 m)   Wt 186 lb 2 oz (84.4 kg)   BMI 26.33 kg/m     Filed Weights   01/10/19 1439  Weight: 186 lb 2 oz (84.4 kg)   Constitutional:  Well-developed, in no acute distress. Psychiatric: Normal mood and affect. Behavior is normal. HEENT: Pupils normal.  Conjunctivae are normal. No scleral icterus. Neck supple.  Cardiovascular: Normal rate, regular rhythm. No edema Pulmonary/chest: Effort normal and breath sounds normal. No wheezing, rales or rhonchi. Abdominal: Soft, nondistended. Nontender. Bowel sounds active throughout. There are no masses palpable. No hepatomegaly. Rectal: To be performed at the time of flex sig Neurological: Alert and oriented to person place and time. Skin: Skin is warm and dry. No rashes noted.    Carmell Austria, MD

## 2019-02-22 ENCOUNTER — Encounter (HOSPITAL_COMMUNITY): Payer: Self-pay | Admitting: Gastroenterology

## 2019-02-22 DIAGNOSIS — Z5111 Encounter for antineoplastic chemotherapy: Secondary | ICD-10-CM | POA: Diagnosis not present

## 2019-02-22 DIAGNOSIS — C218 Malignant neoplasm of overlapping sites of rectum, anus and anal canal: Secondary | ICD-10-CM | POA: Diagnosis not present

## 2019-02-22 DIAGNOSIS — C7800 Secondary malignant neoplasm of unspecified lung: Secondary | ICD-10-CM | POA: Diagnosis not present

## 2019-02-23 DIAGNOSIS — Z79899 Other long term (current) drug therapy: Secondary | ICD-10-CM | POA: Diagnosis not present

## 2019-02-23 DIAGNOSIS — C7800 Secondary malignant neoplasm of unspecified lung: Secondary | ICD-10-CM | POA: Diagnosis not present

## 2019-02-23 DIAGNOSIS — E871 Hypo-osmolality and hyponatremia: Secondary | ICD-10-CM

## 2019-02-23 DIAGNOSIS — K219 Gastro-esophageal reflux disease without esophagitis: Secondary | ICD-10-CM | POA: Diagnosis not present

## 2019-02-23 DIAGNOSIS — C189 Malignant neoplasm of colon, unspecified: Secondary | ICD-10-CM | POA: Diagnosis not present

## 2019-02-23 DIAGNOSIS — E1142 Type 2 diabetes mellitus with diabetic polyneuropathy: Secondary | ICD-10-CM | POA: Diagnosis not present

## 2019-02-23 DIAGNOSIS — E785 Hyperlipidemia, unspecified: Secondary | ICD-10-CM | POA: Diagnosis not present

## 2019-02-23 DIAGNOSIS — G629 Polyneuropathy, unspecified: Secondary | ICD-10-CM

## 2019-02-23 DIAGNOSIS — D696 Thrombocytopenia, unspecified: Secondary | ICD-10-CM | POA: Diagnosis not present

## 2019-02-23 DIAGNOSIS — R0789 Other chest pain: Secondary | ICD-10-CM | POA: Diagnosis not present

## 2019-02-23 DIAGNOSIS — I517 Cardiomegaly: Secondary | ICD-10-CM | POA: Diagnosis not present

## 2019-02-23 DIAGNOSIS — I214 Non-ST elevation (NSTEMI) myocardial infarction: Secondary | ICD-10-CM | POA: Diagnosis not present

## 2019-02-23 DIAGNOSIS — I10 Essential (primary) hypertension: Secondary | ICD-10-CM | POA: Diagnosis not present

## 2019-02-23 DIAGNOSIS — R079 Chest pain, unspecified: Secondary | ICD-10-CM | POA: Diagnosis not present

## 2019-02-24 ENCOUNTER — Inpatient Hospital Stay (HOSPITAL_COMMUNITY)
Admission: AD | Admit: 2019-02-24 | Discharge: 2019-02-28 | DRG: 246 | Disposition: A | Discharge status: Home / Self Care | Payer: Medicare Other | Source: Other Acute Inpatient Hospital | Attending: Internal Medicine | Admitting: Internal Medicine

## 2019-02-24 ENCOUNTER — Inpatient Hospital Stay (HOSPITAL_COMMUNITY): Payer: Medicare Other

## 2019-02-24 DIAGNOSIS — K921 Melena: Secondary | ICD-10-CM | POA: Diagnosis not present

## 2019-02-24 DIAGNOSIS — Z923 Personal history of irradiation: Secondary | ICD-10-CM | POA: Diagnosis not present

## 2019-02-24 DIAGNOSIS — C7800 Secondary malignant neoplasm of unspecified lung: Secondary | ICD-10-CM

## 2019-02-24 DIAGNOSIS — I447 Left bundle-branch block, unspecified: Secondary | ICD-10-CM | POA: Diagnosis present

## 2019-02-24 DIAGNOSIS — Y840 Cardiac catheterization as the cause of abnormal reaction of the patient, or of later complication, without mention of misadventure at the time of the procedure: Secondary | ICD-10-CM | POA: Diagnosis present

## 2019-02-24 DIAGNOSIS — E871 Hypo-osmolality and hyponatremia: Secondary | ICD-10-CM

## 2019-02-24 DIAGNOSIS — I9788 Other intraoperative complications of the circulatory system, not elsewhere classified: Secondary | ICD-10-CM | POA: Diagnosis not present

## 2019-02-24 DIAGNOSIS — I10 Essential (primary) hypertension: Secondary | ICD-10-CM | POA: Diagnosis not present

## 2019-02-24 DIAGNOSIS — Z91048 Other nonmedicinal substance allergy status: Secondary | ICD-10-CM | POA: Diagnosis not present

## 2019-02-24 DIAGNOSIS — K4090 Unilateral inguinal hernia, without obstruction or gangrene, not specified as recurrent: Secondary | ICD-10-CM | POA: Diagnosis present

## 2019-02-24 DIAGNOSIS — Z87891 Personal history of nicotine dependence: Secondary | ICD-10-CM

## 2019-02-24 DIAGNOSIS — I440 Atrioventricular block, first degree: Secondary | ICD-10-CM | POA: Diagnosis present

## 2019-02-24 DIAGNOSIS — C19 Malignant neoplasm of rectosigmoid junction: Secondary | ICD-10-CM | POA: Diagnosis not present

## 2019-02-24 DIAGNOSIS — K219 Gastro-esophageal reflux disease without esophagitis: Secondary | ICD-10-CM | POA: Diagnosis present

## 2019-02-24 DIAGNOSIS — I2510 Atherosclerotic heart disease of native coronary artery without angina pectoris: Secondary | ICD-10-CM | POA: Diagnosis not present

## 2019-02-24 DIAGNOSIS — I2542 Coronary artery dissection: Secondary | ICD-10-CM | POA: Diagnosis present

## 2019-02-24 DIAGNOSIS — Z7984 Long term (current) use of oral hypoglycemic drugs: Secondary | ICD-10-CM

## 2019-02-24 DIAGNOSIS — E1142 Type 2 diabetes mellitus with diabetic polyneuropathy: Secondary | ICD-10-CM | POA: Diagnosis present

## 2019-02-24 DIAGNOSIS — G629 Polyneuropathy, unspecified: Secondary | ICD-10-CM | POA: Diagnosis not present

## 2019-02-24 DIAGNOSIS — Z888 Allergy status to other drugs, medicaments and biological substances status: Secondary | ICD-10-CM

## 2019-02-24 DIAGNOSIS — R001 Bradycardia, unspecified: Secondary | ICD-10-CM | POA: Diagnosis present

## 2019-02-24 DIAGNOSIS — R079 Chest pain, unspecified: Secondary | ICD-10-CM

## 2019-02-24 DIAGNOSIS — I25118 Atherosclerotic heart disease of native coronary artery with other forms of angina pectoris: Secondary | ICD-10-CM | POA: Diagnosis not present

## 2019-02-24 DIAGNOSIS — E119 Type 2 diabetes mellitus without complications: Secondary | ICD-10-CM

## 2019-02-24 DIAGNOSIS — K5521 Angiodysplasia of colon with hemorrhage: Secondary | ICD-10-CM | POA: Diagnosis not present

## 2019-02-24 DIAGNOSIS — D696 Thrombocytopenia, unspecified: Secondary | ICD-10-CM | POA: Diagnosis not present

## 2019-02-24 DIAGNOSIS — I214 Non-ST elevation (NSTEMI) myocardial infarction: Principal | ICD-10-CM | POA: Diagnosis present

## 2019-02-24 DIAGNOSIS — Z955 Presence of coronary angioplasty implant and graft: Secondary | ICD-10-CM | POA: Diagnosis not present

## 2019-02-24 DIAGNOSIS — T451X5A Adverse effect of antineoplastic and immunosuppressive drugs, initial encounter: Secondary | ICD-10-CM | POA: Diagnosis present

## 2019-02-24 DIAGNOSIS — K5900 Constipation, unspecified: Secondary | ICD-10-CM | POA: Diagnosis present

## 2019-02-24 DIAGNOSIS — Y713 Surgical instruments, materials and cardiovascular devices (including sutures) associated with adverse incidents: Secondary | ICD-10-CM | POA: Diagnosis not present

## 2019-02-24 DIAGNOSIS — I2584 Coronary atherosclerosis due to calcified coronary lesion: Secondary | ICD-10-CM | POA: Diagnosis not present

## 2019-02-24 DIAGNOSIS — Z8249 Family history of ischemic heart disease and other diseases of the circulatory system: Secondary | ICD-10-CM

## 2019-02-24 DIAGNOSIS — Z79899 Other long term (current) drug therapy: Secondary | ICD-10-CM

## 2019-02-24 DIAGNOSIS — C20 Malignant neoplasm of rectum: Secondary | ICD-10-CM | POA: Diagnosis not present

## 2019-02-24 DIAGNOSIS — E785 Hyperlipidemia, unspecified: Secondary | ICD-10-CM

## 2019-02-24 DIAGNOSIS — K648 Other hemorrhoids: Secondary | ICD-10-CM | POA: Diagnosis present

## 2019-02-24 DIAGNOSIS — D6959 Other secondary thrombocytopenia: Secondary | ICD-10-CM | POA: Diagnosis present

## 2019-02-24 DIAGNOSIS — C189 Malignant neoplasm of colon, unspecified: Secondary | ICD-10-CM | POA: Diagnosis not present

## 2019-02-24 DIAGNOSIS — Y92234 Operating room of hospital as the place of occurrence of the external cause: Secondary | ICD-10-CM | POA: Diagnosis present

## 2019-02-24 HISTORY — DX: Non-ST elevation (NSTEMI) myocardial infarction: I21.4

## 2019-02-24 HISTORY — DX: Type 2 diabetes mellitus without complications: E11.9

## 2019-02-24 LAB — HEPARIN LEVEL (UNFRACTIONATED): Heparin Unfractionated: 0.78 IU/mL — ABNORMAL HIGH (ref 0.30–0.70)

## 2019-02-24 LAB — TROPONIN I: Troponin I: 0.25 ng/mL (ref <0.03)

## 2019-02-24 LAB — GLUCOSE, CAPILLARY
Glucose-Capillary: 94 mg/dL (ref 70–99)
Glucose-Capillary: 128 mg/dL — ABNORMAL HIGH (ref 70–99)

## 2019-02-24 IMAGING — CT CT HEAR MORPH WITH CTA COR WITH SCORE WITH CA WITH CONTRAST AND
4 of 7 series · 8 of 20 positions shown, 9 images · non-contrast
Comparison: None.

Addendum:
CLINICAL DATA: 72-year-old male with metastatic colorectal cancer,
chest pain and troponin elevation.

EXAM:
Cardiac/Coronary  CT
TECHNIQUE: The patient was scanned on a Phillips Force scanner.

[Series 6: best diast 64 % · axial · 0.39mm/px · z∈[+1124,+1172]mm · 2 of 361 slices shown, 3 images]
[im 121/361  vessel]
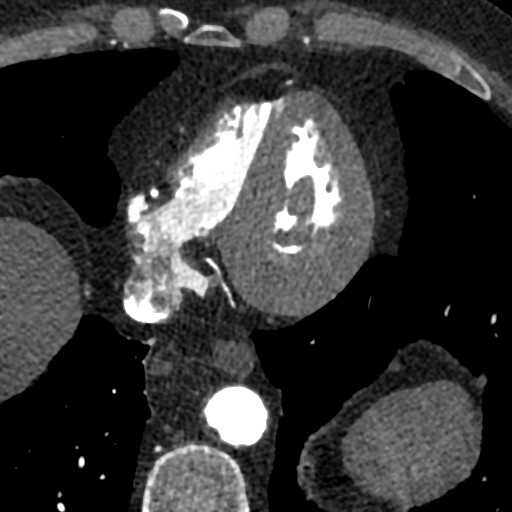
[im 121/361  lung]
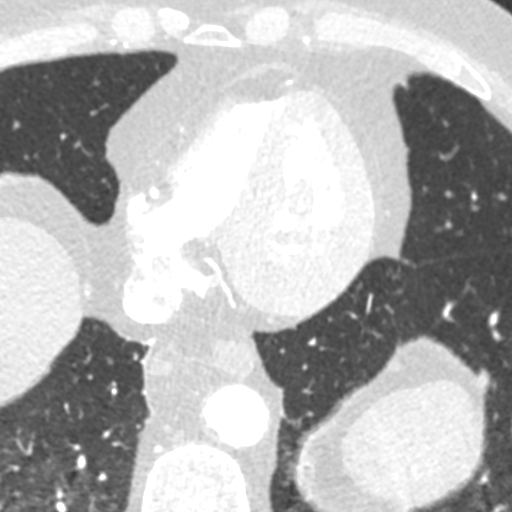
[im 241/361  vessel]
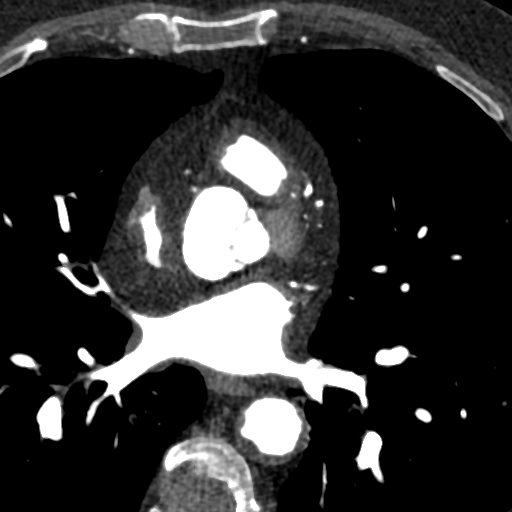

[Series 7: best syst 36 % · axial · 0.39mm/px · z∈[+1124,+1172]mm · 2 of 361 slices shown]
[im 121/361  vessel]
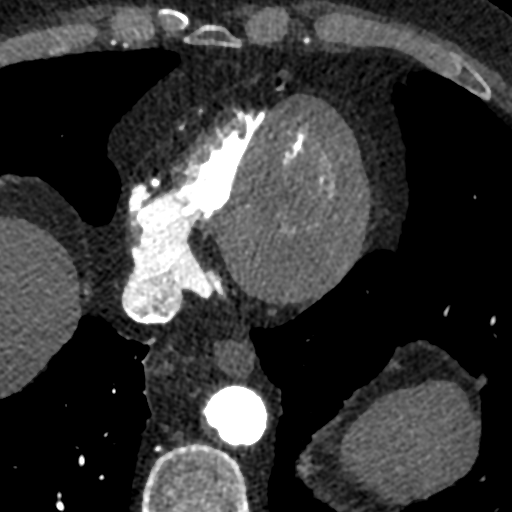
[im 241/361  vessel]
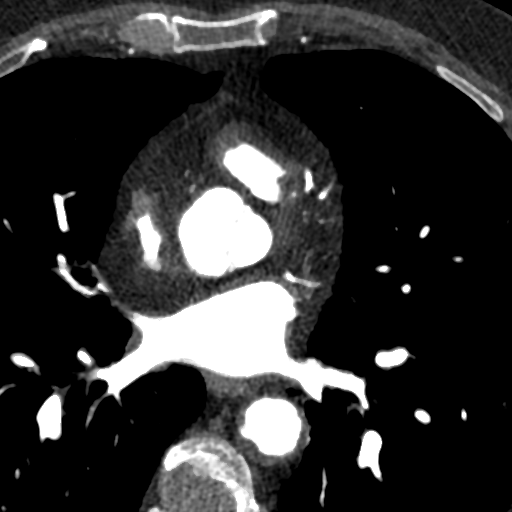

[Series 8: ts diast sharp 64 % · axial · 0.39mm/px · z∈[+1124,+1172]mm · 2 of 361 slices shown]
[im 121/361  lung]
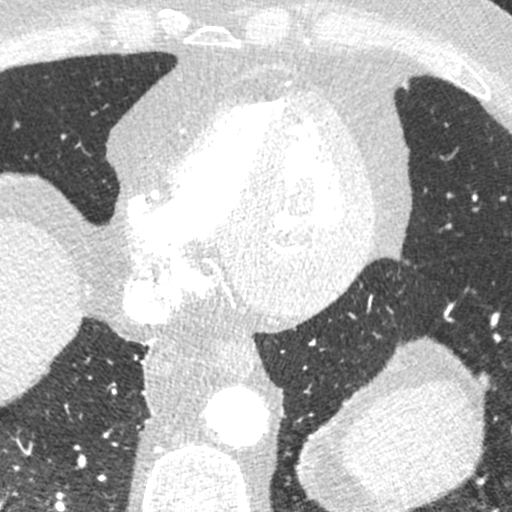
[im 241/361  lung]
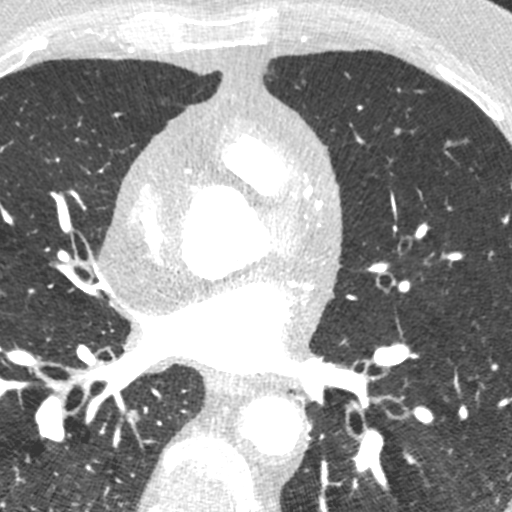

[Series 9: ts syst sharp 36 % · axial · 0.39mm/px · z∈[+1124,+1172]mm · 2 of 361 slices shown]
[im 121/361  lung]
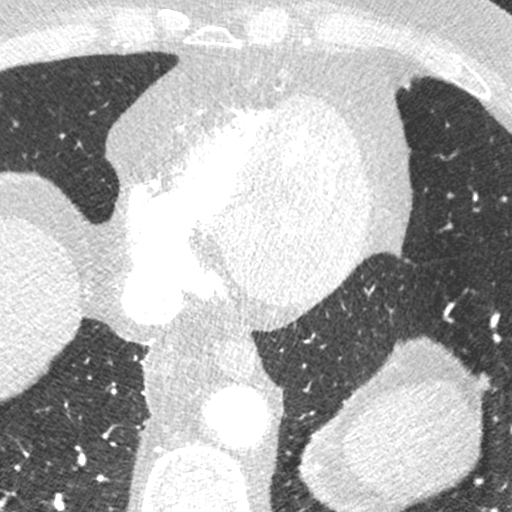
[im 241/361  lung]
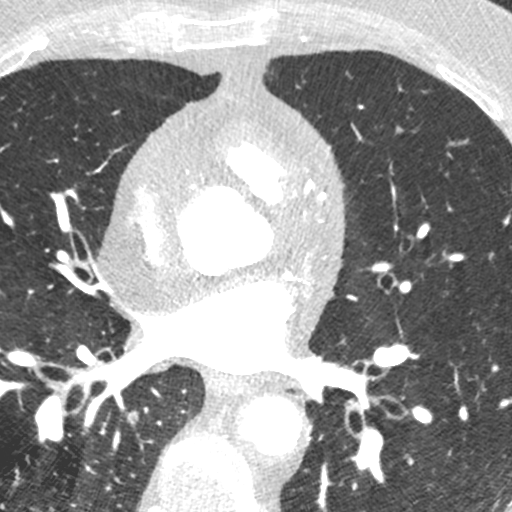

[8 of 20 positions shown; findings below may reference images not displayed]



Aorta: Normal size. Moderate diffuse atherosclerotic plaque and
calcifications. No dissection.

Aortic Valve:  Trileaflet.  No calcifications.

Coronary Arteries:  Normal coronary origin.  Right dominance.

RCA is a large dominant artery that gives rise to PDA and PLVB.
Proximal RCA has a moderate non-calcified plaque with stenosis
50-69% with high risk features (napkin ring sign). This is followed
by a severe mixed plaque suspicious for > 70% stenosis and a
subtotal occlusion in the mid RCA. Distal RCA has moderate mixed
plaque with stenosis 50-69%.

PDA and PLA have mild mixed plaque.

Left main is a large artery that gives rise to LAD and LCX arteries.
Left main has a moderate calcified plaque with stenosis 25-50% and a
possible small dissection at the proximal portion with stenosis
25-50%.

LAD is a large vessel that gives rise to one large diagonal artery.
LAD has diffuse moderate to severe plaque. Proximal to mid LAD has
severe mixed plaque suspicious for stenosis > 70%.

D1 is a large branch with moderate ostial plaque and stenosis
50-69%.

LCX is a non-dominant artery that gives rise to two OM branches. Mid
portion of the LCX artery has a severe calcified plaque with
stenosis at least 50-69% but possibly > 70%.

[N6] have mild diffuse plaque.

Other findings:

Normal pulmonary vein drainage into the left atrium.

Normal let atrial appendage without a thrombus.

Normal size of the pulmonary artery.
IMPRESSION: 1. Coronary calcium score of [N6]. This was 86 percentile for age
and sex matched control.

2. Normal coronary origin with right dominance.

3. Severe diffuse 3 vessel disease with a small dissection in the
proximal left main artery.

EXAM:
OVER-READ INTERPRETATION  CT CHEST

The following report is an over-read performed by radiologist Dr.
does not include interpretation of cardiac or coronary anatomy or
pathology. The cardiac/coronary CT interpretation by the
cardiologist is attached.
FINDINGS: Multiple pulmonary nodules are seen in visualized portions of both
lungs measuring up to 11 mm in the left lower lobe and 10 mm in the
right lower lobe. A few of these nodules shows central areas of
cavitation. These are suspicious for pulmonary metastases. Patient
has history of metastatic colon cancer.
IMPRESSION: Multiple bilateral pulmonary nodules, highly suspicious for
pulmonary metastases from patient's known colon carcinoma.

These results will be called to the ordering clinician or
representative by the Radiologist Assistant, and communication
documented in the PACS or zVision Dashboard.

*** End of Addendum ***
FINDINGS: A 120 kV prospective scan was triggered in the descending thoracic
aorta at 111 HU's. Axial non-contrast 3 mm slices were carried out
through the heart. The data set was analyzed on a dedicated work
station and scored using the Agatson method. Gantry rotation speed
was 250 msecs and collimation was .6 mm. No beta blockade and 0.8 mg
of sl NTG was given. The 3D data set was reconstructed in 5%
intervals of the 67-82 % of the R-R cycle. Diastolic phases were
analyzed on a dedicated work station using MPR, MIP and VRT modes.
The patient received 80 cc of contrast.

Aorta: Normal size. Moderate diffuse atherosclerotic plaque and
calcifications. No dissection.

Aortic Valve:  Trileaflet.  No calcifications.

Coronary Arteries:  Normal coronary origin.  Right dominance.

RCA is a large dominant artery that gives rise to PDA and PLVB.
Proximal RCA has a moderate non-calcified plaque with stenosis
50-69% with high risk features (napkin ring sign). This is followed
by a severe mixed plaque suspicious for > 70% stenosis and a
subtotal occlusion in the mid RCA. Distal RCA has moderate mixed
plaque with stenosis 50-69%.

PDA and PLA have mild mixed plaque.

Left main is a large artery that gives rise to LAD and LCX arteries.
Left main has a moderate calcified plaque with stenosis 25-50% and a
possible small dissection at the proximal portion with stenosis
25-50%.

LAD is a large vessel that gives rise to one large diagonal artery.
LAD has diffuse moderate to severe plaque. Proximal to mid LAD has
severe mixed plaque suspicious for stenosis > 70%.

D1 is a large branch with moderate ostial plaque and stenosis
50-69%.

LCX is a non-dominant artery that gives rise to two OM branches. Mid
portion of the LCX artery has a severe calcified plaque with
stenosis at least 50-69% but possibly > 70%.

[N6] have mild diffuse plaque.

Other findings:

Normal pulmonary vein drainage into the left atrium.

Normal let atrial appendage without a thrombus.

Normal size of the pulmonary artery.
IMPRESSION: 1. Coronary calcium score of [N6]. This was 86 percentile for age
and sex matched control.

2. Normal coronary origin with right dominance.

3. Severe diffuse 3 vessel disease with a small dissection in the
proximal left main artery.

## 2019-02-24 MED ORDER — SODIUM CHLORIDE 0.9 % IV SOLN
INTRAVENOUS | Status: DC | PRN
  Administered 2019-02-25: 21:00:00 1000 mL via INTRAVENOUS

## 2019-02-24 MED ORDER — ACETAMINOPHEN 650 MG RE SUPP: 650.0000 mg | Freq: Four times a day (QID) | RECTAL | Status: DC | PRN

## 2019-02-24 MED ORDER — HEPARIN (PORCINE) 25000 UT/250ML-% IV SOLN
650.0000 [IU]/h | INTRAVENOUS | Status: DC
  Administered 2019-02-24: 18:00:00 800 [IU]/h via INTRAVENOUS
  Administered 2019-02-26: 10:00:00 650 [IU]/h via INTRAVENOUS
  Filled 2019-02-24 (×2): qty 250

## 2019-02-24 MED ORDER — NITROGLYCERIN 0.4 MG SL SUBL
SUBLINGUAL_TABLET | SUBLINGUAL | Status: AC
  Administered 2019-02-24: 0.4 mg
  Filled 2019-02-24: qty 2

## 2019-02-24 MED ORDER — PROCHLORPERAZINE MALEATE 10 MG PO TABS
10.0000 mg | ORAL_TABLET | Freq: Three times a day (TID) | ORAL | Status: DC | PRN
  Filled 2019-02-24: qty 1

## 2019-02-24 MED ORDER — IOHEXOL 350 MG/ML SOLN
100.0000 mL | Freq: Once | INTRAVENOUS | Status: AC | PRN
  Administered 2019-02-24: 21:00:00 100 mL via INTRAVENOUS

## 2019-02-24 MED ORDER — METOPROLOL SUCCINATE ER 100 MG PO TB24
100.0000 mg | ORAL_TABLET | Freq: Every day | ORAL | Status: DC
  Administered 2019-02-25 – 2019-02-28 (×4): 100 mg via ORAL
  Filled 2019-02-24 (×4): qty 1

## 2019-02-24 MED ORDER — NITROGLYCERIN 0.4 MG SL SUBL
SUBLINGUAL_TABLET | SUBLINGUAL | Status: AC
  Administered 2019-02-24: 22:00:00
  Filled 2019-02-24: qty 1

## 2019-02-24 MED ORDER — FAMOTIDINE 20 MG PO TABS: 20.0000 mg | ORAL_TABLET | Freq: Every day | ORAL | Status: DC | PRN

## 2019-02-24 MED ORDER — INSULIN ASPART 100 UNIT/ML ~~LOC~~ SOLN: 0.0000 [IU] | Freq: Every day | SUBCUTANEOUS | Status: DC

## 2019-02-24 MED ORDER — INSULIN ASPART 100 UNIT/ML ~~LOC~~ SOLN
0.0000 [IU] | Freq: Three times a day (TID) | SUBCUTANEOUS | Status: DC
  Administered 2019-02-25 – 2019-02-26 (×2): 1 [IU] via SUBCUTANEOUS
  Administered 2019-02-26: 17:00:00 2 [IU] via SUBCUTANEOUS

## 2019-02-24 MED ORDER — POTASSIUM CHLORIDE CRYS ER 10 MEQ PO TBCR
10.0000 meq | EXTENDED_RELEASE_TABLET | Freq: Every day | ORAL | Status: DC
  Administered 2019-02-25: 10 meq via ORAL
  Filled 2019-02-24 (×2): qty 1

## 2019-02-24 MED ORDER — ASPIRIN EC 81 MG PO TBEC
81.0000 mg | DELAYED_RELEASE_TABLET | Freq: Every day | ORAL | Status: DC
  Administered 2019-02-25 – 2019-02-28 (×4): 81 mg via ORAL
  Filled 2019-02-24 (×5): qty 1

## 2019-02-24 MED ORDER — ATORVASTATIN CALCIUM 80 MG PO TABS
80.0000 mg | ORAL_TABLET | Freq: Every day | ORAL | Status: DC
  Administered 2019-02-24 – 2019-02-25 (×2): 80 mg via ORAL
  Filled 2019-02-24 (×4): qty 1

## 2019-02-24 MED ORDER — MAGNESIUM OXIDE 400 (241.3 MG) MG PO TABS
400.0000 mg | ORAL_TABLET | Freq: Every day | ORAL | Status: DC
  Administered 2019-02-25 – 2019-02-28 (×3): 400 mg via ORAL
  Filled 2019-02-24 (×3): qty 1

## 2019-02-24 MED ORDER — DIPHENOXYLATE-ATROPINE 2.5-0.025 MG PO TABS: 1.0000 | ORAL_TABLET | Freq: Four times a day (QID) | ORAL | Status: DC | PRN

## 2019-02-24 MED ORDER — HYDROCHLOROTHIAZIDE 25 MG PO TABS
25.0000 mg | ORAL_TABLET | Freq: Every day | ORAL | Status: DC
  Administered 2019-02-25: 25 mg via ORAL
  Filled 2019-02-24: qty 1

## 2019-02-24 MED ORDER — ACETAMINOPHEN 325 MG PO TABS: 650.0000 mg | ORAL_TABLET | Freq: Four times a day (QID) | ORAL | Status: DC | PRN

## 2019-02-24 MED ORDER — SPIRONOLACTONE 25 MG PO TABS
25.0000 mg | ORAL_TABLET | Freq: Every day | ORAL | Status: DC
  Administered 2019-02-25: 08:00:00 25 mg via ORAL
  Filled 2019-02-24: qty 1

## 2019-02-24 NOTE — Progress Notes (Addendum)
ANTICOAGULATION CONSULT NOTE - Initial Consult  Pharmacy Consult for heparin Indication: NSTEMI  Allergies  Allergen Reactions  . Lisinopril Hives    Hives   . Adhesive [Tape] Rash    Bandaids  . Aspirin Other (See Comments)    Burns stomach   . Neosporin [Neomycin-Bacitracin Zn-Polymyx] Rash    Patient Measurements: Height: 5\' 10"  (177.8 cm) Weight: 173 lb 4.8 oz (78.6 kg) IBW/kg (Calculated) : 73  Vital Signs: Temp: 97.5 F (36.4 C) (04/03 1453) Temp Source: Oral (04/03 1453) BP: 108/60 (04/03 1453) Pulse Rate: 59 (04/03 1453)  Labs: No results for input(s): HGB, HCT, PLT, APTT, LABPROT, INR, HEPARINUNFRC, HEPRLOWMOCWT, CREATININE, CKTOTAL, CKMB, TROPONINI in the last 72 hours.  CrCl cannot be calculated (No successful lab value found.).   Medical History: Past Medical History:  Diagnosis Date  . Allergic rhinitis   . Cancer ([place])    rectal cancer and found spots on his lungs also and is on chemo currently   . Diabetes mellitus without complication ([place])    type 2  . GERD (gastroesophageal reflux disease)   . Headache    hx of migraines yrs ago  . Hyperlipidemia   . Hypertension   . MVP (mitral valve prolapse)    dx 42 yrs no cardiologist    Medications:  Medications Prior to Admission  Medication Sig Dispense Refill Last Dose  . AMBULATORY NON FORMULARY MEDICATION Currently on 3 chemo medication  and have a pump that is connected for 2 days. Pump bag is fluorouracil 3500mg  CIV mixed with sodium chloride 0.9%   Past Month at Unknown time  . diphenoxylate-atropine (LOMOTIL) 2.5-0.025 MG tablet Take 1 tablet by mouth 4 (four) times daily as needed for diarrhea or loose stools.   Past Week at Unknown time  . famotidine (PEPCID) 20 MG tablet Take 20 mg by mouth as needed for heartburn or indigestion.   02/21/2019 at 0430  . hydrochlorothiazide (HYDRODIURIL) 25 MG tablet Take 25 mg by mouth daily.    02/21/2019 at 0430  . magnesium oxide (MAG-OX) 400 MG tablet  Take 400 mg by mouth daily.    02/20/2019 at Unknown time  . metFORMIN (GLUCOPHAGE-XR) 500 MG 24 hr tablet Take 500 mg by mouth daily with breakfast.    02/20/2019 at Unknown time  . metoprolol succinate (TOPROL-XL) 100 MG 24 hr tablet Take 100 mg by mouth daily. Take with or immediately following a meal.   02/21/2019 at 0430  . potassium chloride (K-DUR) 10 MEQ tablet Take 10 mEq by mouth daily.   02/20/2019 at Unknown time  . prochlorperazine (COMPAZINE) 10 MG tablet Take 10 mg by mouth 3 (three) times daily as needed for nausea/vomiting.   Past Month at Unknown time  . spironolactone (ALDACTONE) 25 MG tablet Take 25 mg by mouth daily.   02/21/2019 at 0430    Assessment: 73 y/o male with Stage IV colorectal cancer who presented to [facility] ED with chest pain. He was found to have a NSTEMI and transferred to [facility] for further evaluation.    I spoke with a pharmacist from OSH who told me they gave heparin 4000 units IV load and then started the drip at 1000 units/hr at 3:16a. He had an aPTT at 9:13a that was 108.1 (goal 45-65 sec). The drip was held for 1 hr and then reduced to 800 units/hr. No bleeding noted, CBC wnl except platelets are low at 87. Reviewed outpatient refill records and no AC filled PTA.  Goal of Therapy:  Heparin level 0.3-0.7 units/ml Monitor platelets by anticoagulation protocol: Yes   Plan:  Continue heparin drip at 800 units/hr Heparin level at 17:00 Daily heparin level and CBC Monitor for s/sx of bleeding   Renold Genta, PharmD, BCPS Clinical Pharmacist Clinical phone for 02/24/2019 until 10p is [phone] 02/24/2019 4:22 PM  **Pharmacist phone directory can now be found on [email or web address] listed under [place]**  Addendum: Heparin level is above goal at 0.78 on 800 units/hr. No bleeding noted. F/U plan for possible cath.  Decrease heparin drip to 700 units/hr Heparin level with am labs  Renold Genta, PharmD, BCPS Clinical Pharmacist 02/24/2019 7:13  PM

## 2019-02-24 NOTE — H&P (Signed)
History and Physical    Dakota Bennett EVO:[id number] DOB: 1946-07-14 DOA: 02/24/2019  PCP: Serita Grammes, MD  Patient coming from: [facility].  I have personally briefly reviewed patient's old medical records available.   Chief Complaint: Chest pain.  HPI: Dakota Bennett is a 73 y.o. male with medical history significant of rectal cancer with metastasis to lung currently on chemotherapy, hypertension, type 2 diabetes on metformin, chronic thrombocytopenia who presented to the [facility] yesterday with about 1 month of intermittent chest pain that was gradually worsening.  According to the patient, he had intermittent epigastric pain, occasionally radiating to neck and left arm, yesterday it was more frequent, he will get it intermittently 4-5 times a day.  This things were not bothering him.  Patient even underwent a colonoscopy 4 days ago without any events.  Yesterday, his symptoms were more frequent so he went to [place]. Denies any fever chills.  Denies any flulike symptoms. ED Course: Chest x-ray with no abnormalities.  EKG nonischemic.  Patient continued to have intermittent chest pain during hospitalization with uptrending troponins, 0.06- 0.2 [id number] 0.68.  Since patient was complaining of intermittent chest pain at [facility], he was started on aspirin and heparin, cardiology was consulted who requested transfer to [facility] for cardiac cath planning. Patient arrived at the cardiology unit, currently denies any complaints.  Patient says he had last episode of chest discomfort in the morning.  Review of Systems: As per HPI otherwise 10 point review of systems negative.    Past Medical History:  Diagnosis Date  . Allergic rhinitis   . Cancer ([place])    rectal cancer and found spots on his lungs also and is on chemo currently   . Diabetes mellitus without complication ([place])    type 2  . GERD (gastroesophageal reflux disease)   . Headache    hx of migraines yrs  ago  . Hyperlipidemia   . Hypertension   . MVP (mitral valve prolapse)    dx 42 yrs no cardiologist    Past Surgical History:  Procedure Laterality Date  . COLONOSCOPY  03/03/2017   Proximal rectal mass (biopsied). Transverse colonic polyps status post polypectomy. Mild sigmoid diverticulosis  . EUS N/A 03/18/2017   Procedure: LOWER ENDOSCOPIC ULTRASOUND (EUS);  Surgeon: Milus Banister, MD;  Location: [facility];  Service: Endoscopy;  Laterality: N/A;  . extensive dental work    . FLEXIBLE SIGMOIDOSCOPY N/A 02/21/2019   Procedure: FLEXIBLE SIGMOIDOSCOPY;  Surgeon: Jackquline Denmark, MD;  Location: [facility] ENDOSCOPY;  Service: Endoscopy;  Laterality: N/A;  . ingrown toenail removed  age 4 or 46     reports that he has quit smoking. His smoking use included cigarettes. He has a 40.00 pack-year smoking history. He quit smokeless tobacco use about 20 years ago. He reports that he does not drink alcohol or use drugs.  Allergies  Allergen Reactions  . Lisinopril Hives    Hives   . Adhesive [Tape] Rash    Bandaids  . Aspirin Other (See Comments)    Burns stomach   . Neosporin [Neomycin-Bacitracin Zn-Polymyx] Rash    Family History  Problem Relation Age of Onset  . Colon cancer Neg Hx   . Esophageal cancer Neg Hx      Prior to Admission medications   Medication Sig Start Date End Date Taking? Authorizing Provider  AMBULATORY NON FORMULARY MEDICATION Currently on 3 chemo medication  and have a pump that is connected for 2 days. Pump bag is fluorouracil  3500mg  CIV mixed with sodium chloride 0.9%    [provider]  diphenoxylate-atropine (LOMOTIL) 2.5-0.025 MG tablet Take 1 tablet by mouth 4 (four) times daily as needed for diarrhea or loose stools. 02/12/19   [provider]  famotidine (PEPCID) 20 MG tablet Take 20 mg by mouth as needed for heartburn or indigestion.    [provider]  hydrochlorothiazide (HYDRODIURIL) 25 MG tablet Take 25 mg by mouth daily.   09/06/17   [provider]  magnesium oxide (MAG-OX) 400 MG tablet Take 400 mg by mouth daily.  11/12/18   [provider]  metFORMIN (GLUCOPHAGE-XR) 500 MG 24 hr tablet Take 500 mg by mouth daily with breakfast.  03/09/17   [provider]  metoprolol succinate (TOPROL-XL) 100 MG 24 hr tablet Take 100 mg by mouth daily. Take with or immediately following a meal.    [provider]  potassium chloride (K-DUR) 10 MEQ tablet Take 10 mEq by mouth daily. 10/13/18   [provider]  prochlorperazine (COMPAZINE) 10 MG tablet Take 10 mg by mouth 3 (three) times daily as needed for nausea/vomiting. 02/13/19   [provider]  spironolactone (ALDACTONE) 25 MG tablet Take 25 mg by mouth daily.    [provider]    Physical Exam: Vitals:   02/24/19 1453  BP: 108/60  Pulse: (!) 59  Temp: (!) 97.5 F (36.4 C)  TempSrc: Oral  SpO2: 95%  Weight: 78.6 kg  Height: 5\' 10"  (1.778 m)    Constitutional: NAD, calm, comfortable Vitals:   02/24/19 1453  BP: 108/60  Pulse: (!) 59  Temp: (!) 97.5 F (36.4 C)  TempSrc: Oral  SpO2: 95%  Weight: 78.6 kg  Height: 5\' 10"  (1.778 m)   Eyes: PERRL, lids and conjunctivae normal ENMT: Mucous membranes are moist. Posterior pharynx clear of any exudate or lesions.Normal dentition.  Neck: normal, supple, no masses, no thyromegaly Respiratory: clear to auscultation bilaterally, no wheezing, no crackles. Normal respiratory effort. No accessory muscle use.  Cardiovascular: Regular rate and rhythm, no murmurs / rubs / gallops. No extremity edema. 2+ pedal pulses. No carotid bruits.  Has a port which is nontender and dry. Abdomen: no tenderness, no masses palpated. No hepatosplenomegaly. Bowel sounds positive.  Musculoskeletal: no clubbing / cyanosis. No joint deformity upper and lower extremities. Good ROM, no contractures. Normal muscle tone.  Skin: no rashes, lesions, ulcers. No induration Neurologic: CN  2-12 grossly intact. Sensation intact, DTR normal. Strength 5/5 in all 4.  Psychiatric: Normal judgment and insight. Alert and oriented x 3. Normal mood.     Labs on Admission: I have personally reviewed following labs and imaging studies  CBC: Recent Labs  Lab 02/21/19 1057  WBC 6.0  HGB 14.7  HCT 46.8  MCV 96.3  PLT 87*   Basic Metabolic Panel: No results for input(s): NA, K, CL, CO2, GLUCOSE, BUN, CREATININE, CALCIUM, MG, PHOS in the last 168 hours. GFR: CrCl cannot be calculated (No successful lab value found.). Liver Function Tests: No results for input(s): AST, ALT, ALKPHOS, BILITOT, PROT, ALBUMIN in the last 168 hours. No results for input(s): LIPASE, AMYLASE in the last 168 hours. No results for input(s): AMMONIA in the last 168 hours. Coagulation Profile: No results for input(s): INR, PROTIME in the last 168 hours. Cardiac Enzymes: No results for input(s): CKTOTAL, CKMB, CKMBINDEX, TROPONINI in the last 168 hours. BNP (last 3 results) No results for input(s): PROBNP in the last 8760 hours. HbA1C: No results for  input(s): HGBA1C in the last 72 hours. CBG: Recent Labs  Lab 02/21/19 1057  GLUCAP 126*   Lipid Profile: No results for input(s): CHOL, HDL, LDLCALC, TRIG, CHOLHDL, LDLDIRECT in the last 72 hours. Thyroid Function Tests: No results for input(s): TSH, T4TOTAL, FREET4, T3FREE, THYROIDAB in the last 72 hours. Anemia Panel: No results for input(s): VITAMINB12, FOLATE, FERRITIN, TIBC, IRON, RETICCTPCT in the last 72 hours. Urine analysis: No results found for: COLORURINE, APPEARANCEUR, LABSPEC, PHURINE, GLUCOSEU, HGBUR, BILIRUBINUR, KETONESUR, PROTEINUR, UROBILINOGEN, NITRITE, LEUKOCYTESUR  Radiological Exams on Admission: No results found.  EKG: Independently reviewed.  Reviewed from outside hospital.  No ST-T wave changes.  Assessment/Plan Principal Problem:   NSTEMI (non-ST elevated myocardial infarction) ([place]) Active Problems:   Rectal cancer  ([facility])   Type 2 diabetes mellitus without complication ([place])     1.  Non-STEMI: Patient presented with 3 weeks of intermittent pain.  His troponins are borderline elevated and has remained flat. Continue aspirin,?  Allergy but able to tolerate. Continue heparin infusion.  Resume his beta-blockers. Called and discussed case with cardiology, further management, if any needed as per cardiology.  2.  Hypertension: Blood pressures fairly stable.  Resume all home medications.  3.  Type 2 diabetes: On metformin at home.  Known A1c of 6.8.  Well controlled.  Keep on sliding scale insulin while in the hospital.  4.  Chronic hyponatremia: Stable.  Sodium is 132.  5.  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  Patient is currently on palliative chemotherapy with 5-fluorouracil, leucovorin and bevaizumab.  Last chemo on 02/22/2019.  He will follow-up with oncologist outpatient.  6.  Chronic thrombocytopenia: Platelets 90, chronic and stable.  Will recheck.    DVT prophylaxis: On heparin infusion Code Status: Full code Family Communication: None Disposition Plan: Home when is stable Consults called: Cardiology Admission status: Inpatient cardiac telemetry   Barb Merino MD [facility] Pager [phone]  If 7PM-7AM, please contact night-coverage [email or web address] Password [email or web address]  02/24/2019, 5:08 PM

## 2019-02-24 NOTE — Consult Note (Signed)
Cardiology Consult    Patient ID: Dakota Bennett MRN: [id number], DOB/AGE: 1946-04-24   Admit date: 02/24/2019 Date of Consult: 02/24/2019  Primary Physician: Serita Grammes, MD Primary Cardiologist: New to [facility]. Previously seen by Dr. Otho Perl at [facility]. Requesting Provider: Barb Merino, MD  Patient Profile    Dakota Bennett is a 73 y.o. male with a history of stage IIIa colorectal cancer with metastasis to the lungs, hypertension, hyperlipidemia, and type 2 diabetes mellitus, GERD, chronic hyponatremia, and chronic thrombocytopenia, who presented to the [facility] ED for evaluation of chest pain and was found to have a NSTEMI. He was transferred to [facility] for further evaluation.  History of Present Illness    Dakota Bennett is a 73 year old male with the above history who was previously followed by Dr. Otho Perl at [facility]. Patient initially seen by Dr. Otho Perl in 09/2017 for preoperative clearance. He had an  Echo which showed normal LV systolic function and a nuclear stress test which showed now evidence of ischemia at that time. He last saw Dr. Otho Perl in 10/2017 and was doing well from a cardiac standpoint at that time.  Patient presented to the [facility] ED from the [place] on 02/23/2019 for evaluation of chest pain. Given the current COVID-19 pandemic and in an effort to limit exposure, I called into the patient's room to obtain the history. Patient reports intermittent substernal chest pain "extreme burning pain" that radiates down both arms. He initially thought it was just his acid reflux and does note improvement of pain after taking antacids. Pain has somewhat atypical and typical features. Pain worse when laying down and improves when sitting up. Pain also worse with activity initially but will ease off some with continuation of activity. He does report intense pain while raking in his yard the other day. Patient reports some associated nausea with the pain and one episode of  vomiting a couple of days ago. He also notes some lightheadedness/dizziness but denies any palpitations or syncope. No associated diaphoresis or shortness of breath. Patient estimates having 5-6 episodes of pain per day and feels like it has gotten progressively worse over the last 3 weeks. Therefore, he decided to go to the ED for further evaluation. No recent fevers, chills, body aches, or respiratory symptoms. No known exposure to COVID-19. He does reports some blood in his stools recently. He had a colonoscopy on 02/21/2019 which reportedly came back clean.   In the [facility] ED, patient hypertensive but vitals stable. EKG showed normal sinus rhythm with LBBB. Troponin elevated at 0.06 >> 0.22 >> 0.39 >> 0.67 >> 0.68. Chest x-ray showed small bilateral pleural nodules consistent with previous CT scans bu no effusions or consolidations. BNP 462. WBC 8.9, Hgb 14.1, Plts 91.  Na 132, K 4.6, Glucose  294, SCr 0.90.   Patient was admitted to [facility] for further evaluation.He was evaluated by Cardiology there. Patient was started on IV Heparin, Aspirin, and Lipitor 80mg  daily. An Echocardiogram was performed and showed LVEF of 55-60% with impaired relaxation as well as trace mitral regurgitation and trace tricuspid regurgitation. The decision was made to transfer him to [facility] for possible cardiac catheterization. Upon arrival to [facility], patient is chest pain free.   Of note, patient has a history of tobacco use but quit about 30 years ago. He denies any alcohol use or recreational drug use. Patient does have a family history of heart disease with his father having a heart attack in  his late 38's.  Past Medical History   Past Medical History:  Diagnosis Date   Allergic rhinitis    Cancer ([place])    rectal cancer and found spots on his lungs also and is on chemo currently    Diabetes mellitus without complication (HCC)    type 2   GERD (gastroesophageal reflux disease)    Headache    hx of  migraines yrs ago   Hyperlipidemia    Hypertension    MVP (mitral valve prolapse)    dx 42 yrs no cardiologist    Past Surgical History:  Procedure Laterality Date   COLONOSCOPY  03/03/2017   Proximal rectal mass (biopsied). Transverse colonic polyps status post polypectomy. Mild sigmoid diverticulosis   EUS N/A 03/18/2017   Procedure: LOWER ENDOSCOPIC ULTRASOUND (EUS);  Surgeon: Milus Banister, MD;  Location: [facility];  Service: Endoscopy;  Laterality: N/A;   extensive dental work     FLEXIBLE SIGMOIDOSCOPY N/A 02/21/2019   Procedure: FLEXIBLE SIGMOIDOSCOPY;  Surgeon: Jackquline Denmark, MD;  Location: [facility];  Service: Endoscopy;  Laterality: N/A;   ingrown toenail removed  age 28 or 17     Allergies  Allergies  Allergen Reactions   Aspirin Other (See Comments)    Burns stomach    Lisinopril     Hives    Adhesive [Tape] Rash    Bandaids   Neosporin [Neomycin-Bacitracin Zn-Polymyx] Rash    Inpatient Medications     [START ON 02/25/2019] aspirin EC  81 mg Oral Daily    Family History    Family History  Problem Relation Age of Onset   Colon cancer Neg Hx    Esophageal cancer Neg Hx    He indicated that the status of his neg hx is unknown.   Social History    Social History   Socioeconomic History   Marital status: Married    Spouse name: Not on file   Number of children: 3   Years of education: Not on file   Highest education level: Not on file  Occupational History   Not on file  Social Needs   Financial resource strain: Not on file   Food insecurity:    Worry: Not on file    Inability: Not on file   Transportation needs:    Medical: Not on file    Non-medical: Not on file  Tobacco Use   Smoking status: Former Smoker    Packs/day: 2.00    Years: 20.00    Pack years: 40.00    Types: Cigarettes   Smokeless tobacco: Former Systems developer    Quit date: 11/23/1998   Tobacco comment: quit 2000  Substance and Sexual Activity    Alcohol use: No   Drug use: No   Sexual activity: Not on file  Lifestyle   Physical activity:    Days per week: Not on file    Minutes per session: Not on file   Stress: Not on file  Relationships   Social connections:    Talks on phone: Not on file    Gets together: Not on file    Attends religious service: Not on file    Active member of club or organization: Not on file    Attends meetings of clubs or organizations: Not on file    Relationship status: Not on file   Intimate partner violence:    Fear of current or ex partner: Not on file    Emotionally abused: Not on file  Physically abused: Not on file    Forced sexual activity: Not on file  Other Topics Concern   Not on file  Social History Narrative   Not on file     Review of Systems   Please see HPI: Review of Systems  Constitutional: Negative for chills, diaphoresis and fever.  HENT: Negative for congestion and sore throat.   Respiratory: Negative for cough, hemoptysis and shortness of breath.   Cardiovascular: Positive for chest pain. Negative for palpitations.  Gastrointestinal: Positive for blood in stool, nausea and vomiting.  Genitourinary: Negative for hematuria.  Musculoskeletal: Negative for myalgias.  Neurological: Positive for dizziness. Negative for loss of consciousness.  Psychiatric/Behavioral: Negative for substance abuse.  All other systems reviewed and are negative.  Physical Exam   Physical Exam per MD:  Blood pressure 108/60, pulse (!) 59, temperature (!) 97.5 F (36.4 C), temperature source Oral, height 5\' 10"  (1.778 m), weight 78.6 kg, SpO2 95 %.  General: 73 y.o. male resting comfortably in no acute distress. Pleasant and cooperative. HEENT: Normal  Neck: Supple. No carotid bruits or JVD appreciated. Lungs: No increased work of breathing. Clear to auscultation bilaterally. No wheezes, rhonchi, or rales. Heart: RRR. Distinct S1 and S2. No murmurs, gallops, or rubs.  Abdomen:  Soft, non-distended, and non-tender to palpation. Bowel sounds present in all 4 quadrants.   Extremities: No clubbing, cyanosis or edema. Radial, posterior tibial, and distal pedal pulses 2+ and equal bilaterally. Skin: Warm and dry. Neuro: Alert and oriented x3. No focal deficits. Moves all extremities spontaneously. Psych: Normal affect.    Labs    Troponin (Point of Care Test) No results for input(s): TROPIPOC in the last 72 hours. No results for input(s): CKTOTAL, CKMB, TROPONINI in the last 72 hours. Lab Results  Component Value Date   WBC 6.0 02/21/2019   HGB 14.7 02/21/2019   HCT 46.8 02/21/2019   MCV 96.3 02/21/2019   PLT 87 (L) 02/21/2019   No results for input(s): NA, K, CL, CO2, BUN, CREATININE, CALCIUM, PROT, BILITOT, ALKPHOS, ALT, AST, GLUCOSE in the last 168 hours.  Invalid input(s): LABALBU No results found for: CHOL, HDL, LDLCALC, TRIG No results found for: [facility]   Radiology Studies    No results found.  EKG     EKG: EKG from [facility] was personally reviewed and demonstrates: Normal sinus rhythm with LBBB and borderline 1st degree AV block.  Telemetry: Telemetry was personally reviewed and demonstrates: Sinus rhythm with heart rates in the 50's to 70's and 1st degree AV block.   Cardiac Imaging    None.  Assessment & Plan    NSTEMI - Patient presented to [facility] ED on 02/23/2019 for evaluation of chest pain and was found to have a NSTEMI. He was transferred to [facility] for further evaluation. Patient chest pain free at this time. - EKG from [facility] shows normal sinus rhythm with LBBB and borderline 1st degree AV block (no prior tracings for comparison). Will recheck one here. - Troponin peaked at 0.68 at [facility]. Will check one more here. - Echo at [facility] showed LVEF of 55-60%. No mention of any wall motion abnormalities or pericardial effusion noted in paper chart.  - Patient currently on IV Heparin. - Continue Aspirin and high-intensity  statin.  - Add low dose beta-blocker when heart rate allows. Heart rate currently in the low 50's so would not add at this time. Patient reportedly on Toprol-XL 100mg  daily at home.  - Patient has some atypical and  typical symptoms. Pain worse when laying down suggestive pericarditis however EKG not consistent with this and no recent URI symptoms. Also has some exertional symptoms. He does have multiple CV risk factors (HTN, HLD, DM, age, former tobacco use but quit over 30 years ago, and family history. Will discuss possible left heart catheterization with MD. Patient reportedly spoke with his Oncologist about this and was given the go ahead because his "cancer is not terminal."   Hypertension - Systolic BP in the 144Y on arrival to the ED but BP currently well controlled. Most recent BP 108/60. - Patient on HCTZ 25mg  daily, Spironolactone 25mg  daily, Toprol-XL 100mg  daily at home. All of these currently being held. - Continue to monitor.   Hyperlipidemia - Lipid panel at [facility]: Cholesterol 194, Triglycerides 159, HDL 37, LDL 125. - Continue Lipitor 80mg  daily (started at [facility]). - Will need repeat lipid panel and CMP in 6 weeks.   Type 2 Diabetes Mellitus - Continue sliding scale per primary team.  Chronic Thrombocytopenia - Platelets 91 on admission. Likely secondary to chemotherapy.  - Will need to continue to monitor closely.  Chronic Hyponatremia - Sodium 132 on admission.  - Management per primary team.  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 Still currently undergoing chemotherapy. - Management per Oncology and primary team.   Signed, Darreld Mclean, PA-C 02/24/2019, 4:08 PM Pager: [phone] For questions or updates, please contact   Please consult [email or web address] for contact info under Cardiology/STEMI.   I have seen and examined the patient along with Darreld Mclean,  PA-C, PA NP.  I have reviewed the chart, notes and new data.  I agree with PA/NP's note.  Key new complaints: .Some of his symptoms are typical for exertional angina pectoris (caused by walking 4/10 of a mile to his planned, alleviated by several minutes of rest) whereas others are distinctly known coronary (occur while lying flat in bed and alleviated by sitting up or taking an antacid).  Currently asymptomatic. Key examination changes: Normal cardiovascular examination (my exam above).  Specifically, no pericardial rub. Key new findings / data: Sinus bradycardia with left bundle branch block (previous tracings not available for review, but intraventricular conduction delay as described in notes from his cardiologist in 2018).  Minimum elevation in cardiac troponin I with a peak of 0.68 and without a typical rise and fall pattern.  PLAN: Mixed clinical presentations with some features strongly suggestive of coronary insufficiency, whilst others are more suggestive of acute pericarditis. Recommend coronary CT angiography tonight.  This will allow [place] to detect any major coronary abnormalities, but may also provide insight into other causes of his pain such as malignant infiltration of the pericardium or pleura. He has advanced, widely spread cancer, but also appears to have excellent functional status and good quality of life.  He still works full-time.  While he may not be a good candidate for surgical revascularization, I do believe he might be an acceptable candidate for percutaneous angioplasty and stent placement. He has recently undergone evaluation for GI bleeding and the most serious abnormality identified was the presence of internal hemorrhoids.  He is not anemic.  He has mild thrombocytopenia, attributable to chemotherapy.  I do not think that the risk of bleeding with dual antiplatelet therapy would be prohibitive.  Sanda Klein, MD, [place] [phone] 02/24/2019, 6:13 PM

## 2019-02-25 ENCOUNTER — Inpatient Hospital Stay (HOSPITAL_COMMUNITY): Payer: Medicare Other

## 2019-02-25 DIAGNOSIS — E119 Type 2 diabetes mellitus without complications: Secondary | ICD-10-CM

## 2019-02-25 DIAGNOSIS — C20 Malignant neoplasm of rectum: Secondary | ICD-10-CM

## 2019-02-25 DIAGNOSIS — E871 Hypo-osmolality and hyponatremia: Secondary | ICD-10-CM

## 2019-02-25 DIAGNOSIS — D696 Thrombocytopenia, unspecified: Secondary | ICD-10-CM

## 2019-02-25 DIAGNOSIS — C7800 Secondary malignant neoplasm of unspecified lung: Secondary | ICD-10-CM

## 2019-02-25 LAB — BASIC METABOLIC PANEL
Anion gap: 10 (ref 5–15)
BUN: 23 mg/dL (ref 8–23)
CO2: 23 mmol/L (ref 22–32)
Calcium: 9.1 mg/dL (ref 8.9–10.3)
Chloride: 100 mmol/L (ref 98–111)
Creatinine, Ser: 1.11 mg/dL (ref 0.61–1.24)
GFR calc Af Amer: >60 mL/min (ref >60)
GFR calc non Af Amer: >60 mL/min (ref >60)
Glucose, Bld: 93 mg/dL (ref 70–99)
Potassium: 4.2 mmol/L (ref 3.5–5.1)
Sodium: 133 mmol/L — ABNORMAL LOW (ref 135–145)

## 2019-02-25 LAB — GLUCOSE, CAPILLARY
Glucose-Capillary: 91 mg/dL (ref 70–99)
Glucose-Capillary: 148 mg/dL — ABNORMAL HIGH (ref 70–99)
Glucose-Capillary: 93 mg/dL (ref 70–99)
Glucose-Capillary: 122 mg/dL — ABNORMAL HIGH (ref 70–99)

## 2019-02-25 LAB — CBC
HCT: 40.5 % (ref 39.0–52.0)
Hemoglobin: 13.5 g/dL (ref 13.0–17.0)
MCH: 31 pg (ref 26.0–34.0)
MCHC: 33.3 g/dL (ref 30.0–36.0)
MCV: 93.1 fL (ref 80.0–100.0)
Platelets: 93 10*3/uL — ABNORMAL LOW (ref 150–400)
RBC: 4.35 MIL/uL (ref 4.22–5.81)
RDW: 17.4 % — ABNORMAL HIGH (ref 11.5–15.5)
WBC: 4.4 10*3/uL (ref 4.0–10.5)
nRBC: 0 % (ref 0.0–0.2)

## 2019-02-25 LAB — HEPARIN LEVEL (UNFRACTIONATED)
Heparin Unfractionated: 0.58 IU/mL (ref 0.30–0.70)
Heparin Unfractionated: 0.7 IU/mL (ref 0.30–0.70)

## 2019-02-25 LAB — HIV ANTIBODY (ROUTINE TESTING W REFLEX): HIV Screen 4th Generation wRfx: NONREACTIVE

## 2019-02-25 NOTE — Progress Notes (Deleted)
Progress Note  Patient Name: Dakota Bennett Date of Encounter: 02/25/2019  Primary Cardiologist: No primary care provider on file. MCR  Subjective   Per Dr. Stanford Breed   Inpatient Medications    Scheduled Meds:  aspirin EC  81 mg Oral Daily   atorvastatin  80 mg Oral q1800   hydrochlorothiazide  25 mg Oral Daily   insulin aspart  0-5 Units Subcutaneous QHS   insulin aspart  0-9 Units Subcutaneous TID WC   magnesium oxide  400 mg Oral Daily   metoprolol succinate  100 mg Oral Daily   potassium chloride  10 mEq Oral Daily   spironolactone  25 mg Oral Daily   Continuous Infusions:  sodium chloride     heparin 700 Units/hr (02/24/19 2315)   PRN Meds: sodium chloride, acetaminophen **OR** acetaminophen, diphenoxylate-atropine, famotidine, prochlorperazine   Vital Signs    Vitals:   02/24/19 2013 02/25/19 0457 02/25/19 0826 02/25/19 0827  BP: 121/67 127/71 126/67   Pulse: (!) 58 61  64  Temp: 97.6 F (36.4 C) 97.6 F (36.4 C)    TempSrc: Oral Oral    SpO2: 94% 93% 100%   Weight:  79 kg    Height:        Intake/Output Summary (Last 24 hours) at 02/25/2019 0931 Last data filed at 02/24/2019 2315 Gross per 24 hour  Intake 40.3 ml  Output --  Net 40.3 ml   Last 3 Weights 02/25/2019 02/24/2019 02/21/2019  Weight (lbs) 174 lb 1.6 oz 173 lb 4.8 oz 180 lb 3 oz  Weight (kg) 78.971 kg 78.608 kg 81.733 kg      Telemetry    SR - Personally Reviewed  ECG    No new - Personally Reviewed  Physical Exam  Per Dr. Stanford Breed  GEN: No acute distress.   Neck: No JVD Cardiac: RRR, no murmurs, rubs, or gallops.  Respiratory: Clear to auscultation bilaterally. GI: Soft, nontender, non-distended  MS: No edema; No deformity. Neuro:  Nonfocal  Psych: Normal affect   Labs    Chemistry Recent Labs  Lab 02/25/19 0532  NA 133*  K 4.2  CL 100  CO2 23  GLUCOSE 93  BUN 23  CREATININE 1.11  CALCIUM 9.1  GFRNONAA >60  GFRAA >60  ANIONGAP 10     Hematology Recent  Labs  Lab 02/21/19 1057 02/25/19 0532  WBC 6.0 4.4  RBC 4.86 4.35  HGB 14.7 13.5  HCT 46.8 40.5  MCV 96.3 93.1  MCH 30.2 31.0  MCHC 31.4 33.3  RDW 17.2* 17.4*  PLT 87* 93*    Cardiac Enzymes Recent Labs  Lab 02/24/19 1702  TROPONINI 0.25*   No results for input(s): TROPIPOC in the last 168 hours.   BNPNo results for input(s): BNP, PROBNP in the last 168 hours.   DDimer No results for input(s): DDIMER in the last 168 hours.   Radiology    Ct Coronary Morph W/cta Cor W/score W/ca W/cm &/or Wo/cm  Result Date: 02/25/2019 CLINICAL DATA:  73 year old male with metastatic colorectal cancer, chest pain and troponin elevation. EXAM: Cardiac/Coronary  CT TECHNIQUE: The patient was scanned on a [employer]. FINDINGS: A 120 kV prospective scan was triggered in the descending thoracic aorta at 111 HU's. Axial non-contrast 3 mm slices were carried out through the heart. The data set was analyzed on a dedicated work station and scored using the [employer]. Gantry rotation speed was 250 msecs and collimation was .6 mm. No beta blockade and  0.8 mg of sl NTG was given. The 3D data set was reconstructed in 5% intervals of the 67-82 % of the R-R cycle. Diastolic phases were analyzed on a dedicated work station using MPR, MIP and VRT modes. The patient received 80 cc of contrast. Aorta: Normal size. Moderate diffuse atherosclerotic plaque and calcifications. No dissection. Aortic Valve:  Trileaflet.  No calcifications. Coronary Arteries:  Normal coronary origin.  Right dominance. RCA is a large dominant artery that gives rise to PDA and PLVB. Proximal RCA has a moderate non-calcified plaque with stenosis 50-69% with high risk features (napkin ring sign). This is followed by a severe mixed plaque suspicious for > 70% stenosis and a subtotal occlusion in the mid RCA. Distal RCA has moderate mixed plaque with stenosis 50-69%. PDA and PLA have mild mixed plaque. Left main is a large artery that  gives rise to LAD and LCX arteries. Left main has a moderate calcified plaque with stenosis 25-50% and a possible small dissection at the proximal portion with stenosis 25-50%. LAD is a large vessel that gives rise to one large diagonal artery. LAD has diffuse moderate to severe plaque. Proximal to mid LAD has severe mixed plaque suspicious for stenosis > 70%. D1 is a large branch with moderate ostial plaque and stenosis 50-69%. LCX is a non-dominant artery that gives rise to two OM branches. Mid portion of the LCX artery has a severe calcified plaque with stenosis at least 50-69% but possibly > 70%. OM1,2 have mild diffuse plaque. Other findings: Normal pulmonary vein drainage into the left atrium. Normal let atrial appendage without a thrombus. Normal size of the pulmonary artery. IMPRESSION: 1. Coronary calcium score of 1106. This was 24 percentile for age and sex matched control. 2. Normal coronary origin with right dominance. 3. Severe diffuse 3 vessel disease with a small dissection in the proximal left main artery. Electronically Signed   By: Ena Dawley   On: 02/25/2019 09:20    Cardiac Studies   Echo at [facility] An Echocardiogram was performed and showed LVEF of 55-60% with impaired relaxation as well as trace mitral regurgitation and trace tricuspid regurgitation  Patient Profile     73 y.o. male with a history of stage IIIa colorectal cancer with metastasis to the lungs, hypertension, hyperlipidemia, and type 2 diabetes mellitus, GERD, chronic hyponatremia, and chronic thrombocytopenia, who presented to the [facility] ED for evaluation of chest pain and was found to have a NSTEMI.  In 2018 he was seen by Dr. Otho Perl at [facility].   Pt here had cardiac CTA.   Assessment & Plan    NSTEMI with troponin 0.06-> 0.22->0.39->0.67->0.68   --cardiac CTA  With Severe diffuse 3 vessel disease with a small dissection in the proximal left main artery -Dr. Stanford Breed to see    CAD with  coronary calcium score of 1106, 86% for age and sex matched control.  Severe diffuse 3 vessel disease with  a small dissection in the proximal left main artery.  FFR pending --RCA proximal 50-69% stenosis followed by >70% stenosis and subtotal occlusion in the mid RCA, distal RCA with 50-69%; LM 25-50% stenosis and as above;  LAD with >70% stenosis, D1 50-69%, LCX mild portion with 50-69% but possible >70%   HTN    HLD with LDL of 125 now on Lipitor 80  DM SSI per IM  Chronic thrombocytopenia plts 91 on admit secondary to chemo  Chronic Hyponatremia - Sodium 132 on admission.  - Management per primary  team.  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 Still currently undergoing chemotherapy. - Management per Oncology and primary team.     For questions or updates, please contact [place] Please consult [email or web address] for contact info under        Signed, Cecilie Kicks, NP  02/25/2019, 9:31 AM

## 2019-02-25 NOTE — Progress Notes (Signed)
Progress Note  Patient Name: Dakota Bennett Date of Encounter: 02/25/2019  Primary Cardiologist: Dr Sallyanne Kuster  Subjective   CP earlier when went to bathroom; present pain free; no dyspnea  Inpatient Medications    Scheduled Meds: . aspirin EC  81 mg Oral Daily  . atorvastatin  80 mg Oral q1800  . hydrochlorothiazide  25 mg Oral Daily  . insulin aspart  0-5 Units Subcutaneous QHS  . insulin aspart  0-9 Units Subcutaneous TID WC  . magnesium oxide  400 mg Oral Daily  . metoprolol succinate  100 mg Oral Daily  . potassium chloride  10 mEq Oral Daily  . spironolactone  25 mg Oral Daily   Continuous Infusions: . sodium chloride    . heparin 700 Units/hr (02/24/19 2315)   PRN Meds: sodium chloride, acetaminophen **OR** acetaminophen, diphenoxylate-atropine, famotidine, prochlorperazine   Vital Signs    Vitals:   02/24/19 2013 02/25/19 0457 02/25/19 0826 02/25/19 0827  BP: 121/67 127/71 126/67   Pulse: (!) 58 61  64  Temp: 97.6 F (36.4 C) 97.6 F (36.4 C)    TempSrc: Oral Oral    SpO2: 94% 93% 100%   Weight:  79 kg    Height:        Intake/Output Summary (Last 24 hours) at 02/25/2019 0943 Last data filed at 02/24/2019 2315 Gross per 24 hour  Intake 40.3 ml  Output -  Net 40.3 ml   Last 3 Weights 02/25/2019 02/24/2019 02/21/2019  Weight (lbs) 174 lb 1.6 oz 173 lb 4.8 oz 180 lb 3 oz  Weight (kg) 78.971 kg 78.608 kg 81.733 kg      Telemetry    Sinus- Personally Reviewed  Physical Exam   GEN: No acute distress.   Neck: No JVD Cardiac: distant heart sounds; RRR Respiratory: Clear to auscultation bilaterally. GI: Soft, nontender, non-distended  MS: No edema Neuro:  Nonfocal  Psych: Normal affect   Labs    Chemistry Recent Labs  Lab 02/25/19 0532  NA 133*  K 4.2  CL 100  CO2 23  GLUCOSE 93  BUN 23  CREATININE 1.11  CALCIUM 9.1  GFRNONAA >60  GFRAA >60  ANIONGAP 10     Hematology Recent Labs  Lab 02/21/19 1057 02/25/19 0532  WBC 6.0 4.4  RBC  4.86 4.35  HGB 14.7 13.5  HCT 46.8 40.5  MCV 96.3 93.1  MCH 30.2 31.0  MCHC 31.4 33.3  RDW 17.2* 17.4*  PLT 87* 93*    Cardiac Enzymes Recent Labs  Lab 02/24/19 1702  TROPONINI 0.25*    Radiology    Ct Coronary Morph W/cta Cor W/score W/ca W/cm &/or Wo/cm  Result Date: 02/25/2019 CLINICAL DATA:  73 year old male with metastatic colorectal cancer, chest pain and troponin elevation. EXAM: Cardiac/Coronary  CT TECHNIQUE: The patient was scanned on a [employer]. FINDINGS: A 120 kV prospective scan was triggered in the descending thoracic aorta at 111 HU's. Axial non-contrast 3 mm slices were carried out through the heart. The data set was analyzed on a dedicated work station and scored using the [place]. Gantry rotation speed was 250 msecs and collimation was .6 mm. No beta blockade and 0.8 mg of sl NTG was given. The 3D data set was reconstructed in 5% intervals of the 67-82 % of the R-R cycle. Diastolic phases were analyzed on a dedicated work station using MPR, MIP and VRT modes. The patient received 80 cc of contrast. Aorta: Normal size. Moderate diffuse atherosclerotic plaque and  calcifications. No dissection. Aortic Valve:  Trileaflet.  No calcifications. Coronary Arteries:  Normal coronary origin.  Right dominance. RCA is a large dominant artery that gives rise to PDA and PLVB. Proximal RCA has a moderate non-calcified plaque with stenosis 50-69% with high risk features (napkin ring sign). This is followed by a severe mixed plaque suspicious for > 70% stenosis and a subtotal occlusion in the mid RCA. Distal RCA has moderate mixed plaque with stenosis 50-69%. PDA and PLA have mild mixed plaque. Left main is a large artery that gives rise to LAD and LCX arteries. Left main has a moderate calcified plaque with stenosis 25-50% and a possible small dissection at the proximal portion with stenosis 25-50%. LAD is a large vessel that gives rise to one large diagonal artery. LAD has  diffuse moderate to severe plaque. Proximal to mid LAD has severe mixed plaque suspicious for stenosis > 70%. D1 is a large branch with moderate ostial plaque and stenosis 50-69%. LCX is a non-dominant artery that gives rise to two OM branches. Mid portion of the LCX artery has a severe calcified plaque with stenosis at least 50-69% but possibly > 70%. OM1,2 have mild diffuse plaque. Other findings: Normal pulmonary vein drainage into the left atrium. Normal let atrial appendage without a thrombus. Normal size of the pulmonary artery. IMPRESSION: 1. Coronary calcium score of 1106. This was 52 percentile for age and sex matched control. 2. Normal coronary origin with right dominance. 3. Severe diffuse 3 vessel disease with a small dissection in the proximal left main artery. Electronically Signed   By: Ena Dawley   On: 02/25/2019 09:20    Patient Profile     73 y.o. male with past medical history of metastatic rectal cancer, diabetes mellitus, hypertension, hyperlipidemia admitted with non-ST elevation myocardial infarction.  Transferred from [facility].  Echo there by report showed normal LV function.  Cardiac CTA here shows severe three-vessel coronary artery disease.  Assessment & Plan    1 non-ST elevation myocardial infarction-patient is pain-free this morning.  Continue aspirin, heparin, statin and beta-blocker.  Cardiac CTA as noted show severe three-vessel coronary disease.  Question left main dissection.  Patient will require cardiac catheterization.  We will plan to proceed on Monday or sooner if needed.  The risks and benefits including myocardial infarction, CVA and death discussed and he agrees to proceed.  2 hypertension-we will continue present dose of Toprol.  Hold hydrochlorothiazide and spironolactone prior to cardiac catheterization.  Follow blood pressure and adjust regimen as needed.  3 history of chronic thrombocytopenia-platelet count approximately 90,000.  Continue to  follow while in-house.  4 history of metastatic rectal cancer-patient with good functional capacity.  Follow-up oncology.  For questions or updates, please contact [place] Please consult [email or web address] for contact info under        Signed, Kirk Ruths, MD  02/25/2019, 9:43 AM

## 2019-02-25 NOTE — Progress Notes (Signed)
[place] for heparin Indication: NSTEMI  Allergies  Allergen Reactions  . Lisinopril Hives       . Adhesive [Tape] Rash    Bandaids  . Aspirin Other (See Comments)    Burns stomach   . Neosporin [Neomycin-Bacitracin Zn-Polymyx] Rash    Patient Measurements: Height: 5\' 10"  (177.8 cm) Weight: 174 lb 1.6 oz (79 kg) IBW/kg (Calculated) : 73  Vital Signs: Temp: 97.6 F (36.4 C) (04/04 0457) Temp Source: Oral (04/04 0457) BP: 126/67 (04/04 0826) Pulse Rate: 64 (04/04 0827)  Labs: Recent Labs    02/24/19 1702 02/25/19 0532 02/25/19 0533 02/25/19 1230  HGB  --  13.5  --   --   HCT  --  40.5  --   --   PLT  --  93*  --   --   HEPARINUNFRC 0.78*  --  0.58 0.70  CREATININE  --  1.11  --   --   TROPONINI 0.25*  --   --   --     Estimated Creatinine Clearance: 63.9 mL/min (by C-G formula based on SCr of 1.11 mg/dL).   Medical History: Past Medical History:  Diagnosis Date  . Allergic rhinitis   . Cancer ([place])    rectal cancer and found spots on his lungs also and is on chemo currently   . Diabetes mellitus without complication ([place])    type 2  . GERD (gastroesophageal reflux disease)   . Headache    hx of migraines yrs ago  . Hyperlipidemia   . Hypertension   . MVP (mitral valve prolapse)    dx 42 yrs no cardiologist    Medications:  Medications Prior to Admission  Medication Sig Dispense Refill Last Dose  . acetaminophen (TYLENOL) 500 MG tablet Take 1,000 mg by mouth every 6 (six) hours as needed for headache (pain).   02/23/2019 at Unknown time  . alum & mag hydroxide-simeth (MAALOX/MYLANTA) 200-200-20 MG/5ML suspension Take 10 mLs by mouth 3 (three) times daily as needed for indigestion or heartburn.   02/23/2019 at Unknown time  . AMBULATORY NON FORMULARY MEDICATION Currently on 3 chemo medication  and have a pump that is connected for 2 days. Pump bag is fluorouracil 3500mg  CIV mixed with sodium chloride 0.9%   02/22/2019   . diphenoxylate-atropine (LOMOTIL) 2.5-0.025 MG tablet Take 1 tablet by mouth 4 (four) times daily as needed for diarrhea or loose stools.   week ago  . gabapentin (NEURONTIN) 300 MG capsule Take 300 mg by mouth 2 (two) times daily.   02/23/2019 at pm  . hydrochlorothiazide (HYDRODIURIL) 25 MG tablet Take 25 mg by mouth daily.    02/23/2019 at am  . metFORMIN (GLUCOPHAGE-XR) 500 MG 24 hr tablet Take 500 mg by mouth daily with breakfast.    02/23/2019 at am  . metoprolol succinate (TOPROL-XL) 100 MG 24 hr tablet Take 100 mg by mouth daily. Take with or immediately following a meal.   02/23/2019 at 600  . pantoprazole (PROTONIX) 40 MG tablet Take 40 mg by mouth daily.   02/23/2019 at pm  . prochlorperazine (COMPAZINE) 10 MG tablet Take 10 mg by mouth 3 (three) times daily as needed for nausea/vomiting.   2 weeks ago  . spironolactone (ALDACTONE) 25 MG tablet Take 25 mg by mouth daily.   02/23/2019 at am    Assessment: 73 y/o male with Stage IV colorectal cancer who presented to [facility] ED with chest pain. He is noted  with NSTEMI and for cath on 4/6 -heparin level = 0.7  Goal of Therapy:  Heparin level 0.3-0.7 units/ml Monitor platelets by anticoagulation protocol: Yes   Plan:  Decrease heparin to 650 units/hr Daily heparin level and CBC  Hildred Laser, PharmD Clinical Pharmacist **Pharmacist phone directory can now be found on [email or web address] ([email or web address]).  Listed under [place].

## 2019-02-25 NOTE — Progress Notes (Signed)
PROGRESS NOTE  Dakota Bennett RWE:[id number] DOB: [birth year] DOA: 02/24/2019 PCP: Serita Grammes, MD  HPI/Recap of past 24 hours:  Sitting up in chair, on heprine drip, currently no chest pain, hemodynamically stable  Assessment/Plan: Principal Problem:   NSTEMI (non-ST elevated myocardial infarction) ([place]) Active Problems:   Rectal cancer ([place])   Type 2 diabetes mellitus without complication ([place])   Non-STEMI: -Patient presented with 3 weeks of intermittent pain, he is transferred from [place] [facility] cardiology evaluation -CTA coronary showed "Severe diffuse 3 vessel disease with a small dissection in the proximal left main artery." -He is started on heparin drip, asa and high dose statin, home meds toprol-xl continued -cardiology plan to do cardiac cath on Monday, management per cardiology  HTN: bp stable on metoprolol,  Home meds HCTZ and spironolactone held  Noninsulin dependent dm2 Home meds metformin held On ssi here  Chronic hyponatremia: mild, Stable.  Sodium is 132 Hold HCTZ  Chronic thrombocytopenia Platelets 90, chronic and stable  Stage IV rectal cancer (Dx 02/2017) withmetsto lung.Currently on palliative5-FU/leucovorin/bevacizumab q2weeky. Had 4500 + 540 Gy XRT).  Last chemo on 4/1 Appear to have good performance status,  Follow up with oncology on outpatient basis  Code Status: full  Family Communication: patient   Disposition Plan: not ready to discharge   Consultants:  cardiology  Procedures:  Cardiac cath planned on monday  Antibiotics:  none   Objective: BP 126/67 (BP Location: Right Arm)   Pulse 64   Temp 97.6 F (36.4 C) (Oral)   Ht 5\' 10"  (1.778 m)   Wt 79 kg   SpO2 100%   BMI 24.98 kg/m   Intake/Output Summary (Last 24 hours) at 02/25/2019 1145 Last data filed at 02/24/2019 2315 Gross per 24 hour  Intake 40.3 ml  Output -  Net 40.3 ml   Filed Weights   02/24/19 1453 02/25/19 0457  Weight: 78.6 kg 79 kg     Exam: Patient is examined daily including today on 02/25/2019, exams remain the same as of yesterday except that has changed    General:  NAD  Cardiovascular: RRR  Respiratory: CTABL  Abdomen: Soft/ND/NT, positive BS  Musculoskeletal: No Edema  Neuro: alert, oriented   Data Reviewed: Basic Metabolic Panel: Recent Labs  Lab 02/25/19 0532  NA 133*  K 4.2  CL 100  CO2 23  GLUCOSE 93  BUN 23  CREATININE 1.11  CALCIUM 9.1   Liver Function Tests: No results for input(s): AST, ALT, ALKPHOS, BILITOT, PROT, ALBUMIN in the last 168 hours. No results for input(s): LIPASE, AMYLASE in the last 168 hours. No results for input(s): AMMONIA in the last 168 hours. CBC: Recent Labs  Lab 02/21/19 1057 02/25/19 0532  WBC 6.0 4.4  HGB 14.7 13.5  HCT 46.8 40.5  MCV 96.3 93.1  PLT 87* 93*   Cardiac Enzymes:   Recent Labs  Lab 02/24/19 1702  TROPONINI 0.25*   BNP (last 3 results) No results for input(s): BNP in the last 8760 hours.  ProBNP (last 3 results) No results for input(s): PROBNP in the last 8760 hours.  CBG: Recent Labs  Lab 02/21/19 1057 02/24/19 1705 02/24/19 2011 02/25/19 0727 02/25/19 1138  GLUCAP 126* 94 128* 91 148*    No results found for this or any previous visit (from the past 240 hour(s)).   Studies: Ct Coronary Morph W/cta Cor W/score W/ca W/cm &/or Wo/cm  Result Date: 02/25/2019 CLINICAL DATA:  73 year old male with metastatic colorectal cancer, chest  pain and troponin elevation. EXAM: Cardiac/Coronary  CT TECHNIQUE: The patient was scanned on a [employer]. FINDINGS: A 120 kV prospective scan was triggered in the descending thoracic aorta at 111 HU's. Axial non-contrast 3 mm slices were carried out through the heart. The data set was analyzed on a dedicated work station and scored using the [place]. Gantry rotation speed was 250 msecs and collimation was .6 mm. No beta blockade and 0.8 mg of sl NTG was given. The 3D data set  was reconstructed in 5% intervals of the 67-82 % of the R-R cycle. Diastolic phases were analyzed on a dedicated work station using MPR, MIP and VRT modes. The patient received 80 cc of contrast. Aorta: Normal size. Moderate diffuse atherosclerotic plaque and calcifications. No dissection. Aortic Valve:  Trileaflet.  No calcifications. Coronary Arteries:  Normal coronary origin.  Right dominance. RCA is a large dominant artery that gives rise to PDA and PLVB. Proximal RCA has a moderate non-calcified plaque with stenosis 50-69% with high risk features (napkin ring sign). This is followed by a severe mixed plaque suspicious for > 70% stenosis and a subtotal occlusion in the mid RCA. Distal RCA has moderate mixed plaque with stenosis 50-69%. PDA and PLA have mild mixed plaque. Left main is a large artery that gives rise to LAD and LCX arteries. Left main has a moderate calcified plaque with stenosis 25-50% and a possible small dissection at the proximal portion with stenosis 25-50%. LAD is a large vessel that gives rise to one large diagonal artery. LAD has diffuse moderate to severe plaque. Proximal to mid LAD has severe mixed plaque suspicious for stenosis > 70%. D1 is a large branch with moderate ostial plaque and stenosis 50-69%. LCX is a non-dominant artery that gives rise to two OM branches. Mid portion of the LCX artery has a severe calcified plaque with stenosis at least 50-69% but possibly > 70%. OM1,2 have mild diffuse plaque. Other findings: Normal pulmonary vein drainage into the left atrium. Normal let atrial appendage without a thrombus. Normal size of the pulmonary artery. IMPRESSION: 1. Coronary calcium score of 1106. This was 44 percentile for age and sex matched control. 2. Normal coronary origin with right dominance. 3. Severe diffuse 3 vessel disease with a small dissection in the proximal left main artery. Electronically Signed   By: Ena Dawley   On: 02/25/2019 09:20    Scheduled Meds: .  aspirin EC  81 mg Oral Daily  . atorvastatin  80 mg Oral q1800  . insulin aspart  0-5 Units Subcutaneous QHS  . insulin aspart  0-9 Units Subcutaneous TID WC  . magnesium oxide  400 mg Oral Daily  . metoprolol succinate  100 mg Oral Daily    Continuous Infusions: . sodium chloride    . heparin 700 Units/hr (02/24/19 2315)     Time spent: 45mins I have personally reviewed and interpreted on  02/25/2019 daily labs, tele strips, imagings as discussed above under date review session and assessment and plans.  I reviewed all nursing notes, pharmacy notes, consultant notes,  vitals, pertinent old records  I have discussed plan of care as described above with RN , patient  on 02/25/2019   Florencia Reasons MD, PhD  [facility] Pager [phone]. If 7PM-7AM, please contact night-coverage at [email or web address], password [facility] 02/25/2019, 11:45 AM  LOS: 1 day

## 2019-02-25 NOTE — Progress Notes (Signed)
ANTICOAGULATION CONSULT NOTE - Follow Up Consult  Pharmacy Consult for heparin Indication: NSTEMI  Labs: Recent Labs    02/24/19 1702 02/25/19 0532 02/25/19 0533  HEPARINUNFRC 0.78*  --  0.58  CREATININE  --  1.11  --   TROPONINI 0.25*  --   --     Assessment/Plan:  73yo male therapeutic on heparin after rate change. Will continue gtt at current rate and confirm stable with additional level.   Wynona Neat, PharmD, BCPS  02/25/2019,6:12 AM

## 2019-02-26 LAB — CBC
HCT: 39.1 % (ref 39.0–52.0)
Hemoglobin: 13 g/dL (ref 13.0–17.0)
MCH: 30.4 pg (ref 26.0–34.0)
MCHC: 33.2 g/dL (ref 30.0–36.0)
MCV: 91.4 fL (ref 80.0–100.0)
Platelets: 94 10*3/uL — ABNORMAL LOW (ref 150–400)
RBC: 4.28 MIL/uL (ref 4.22–5.81)
RDW: 16.9 % — ABNORMAL HIGH (ref 11.5–15.5)
WBC: 3.9 10*3/uL — ABNORMAL LOW (ref 4.0–10.5)
nRBC: 0 % (ref 0.0–0.2)

## 2019-02-26 LAB — LIPID PANEL
Cholesterol: 150 mg/dL (ref 0–200)
HDL: 35 mg/dL — ABNORMAL LOW (ref >40)
LDL Cholesterol: 97 mg/dL (ref 0–99)
Total CHOL/HDL Ratio: 4.3 RATIO
Triglycerides: 88 mg/dL (ref <150)
VLDL: 18 mg/dL (ref 0–40)

## 2019-02-26 LAB — BASIC METABOLIC PANEL
Anion gap: 10 (ref 5–15)
BUN: 19 mg/dL (ref 8–23)
CO2: 24 mmol/L (ref 22–32)
Calcium: 8.8 mg/dL — ABNORMAL LOW (ref 8.9–10.3)
Chloride: 97 mmol/L — ABNORMAL LOW (ref 98–111)
Creatinine, Ser: 0.99 mg/dL (ref 0.61–1.24)
GFR calc Af Amer: >60 mL/min (ref >60)
GFR calc non Af Amer: >60 mL/min (ref >60)
Glucose, Bld: 114 mg/dL — ABNORMAL HIGH (ref 70–99)
Potassium: 4.1 mmol/L (ref 3.5–5.1)
Sodium: 131 mmol/L — ABNORMAL LOW (ref 135–145)

## 2019-02-26 LAB — MAGNESIUM: Magnesium: 2.1 mg/dL (ref 1.7–2.4)

## 2019-02-26 LAB — GLUCOSE, CAPILLARY
Glucose-Capillary: 121 mg/dL — ABNORMAL HIGH (ref 70–99)
Glucose-Capillary: 111 mg/dL — ABNORMAL HIGH (ref 70–99)
Glucose-Capillary: 169 mg/dL — ABNORMAL HIGH (ref 70–99)
Glucose-Capillary: 137 mg/dL — ABNORMAL HIGH (ref 70–99)

## 2019-02-26 LAB — HEMOGLOBIN A1C
Hgb A1c MFr Bld: 6.8 % — ABNORMAL HIGH (ref 4.8–5.6)
Mean Plasma Glucose: 148.46 mg/dL

## 2019-02-26 LAB — HEPARIN LEVEL (UNFRACTIONATED): Heparin Unfractionated: 0.54 IU/mL (ref 0.30–0.70)

## 2019-02-26 MED ORDER — SODIUM CHLORIDE 0.9% FLUSH
3.0000 mL | Freq: Two times a day (BID) | INTRAVENOUS | Status: DC
  Administered 2019-02-26: 3 mL via INTRAVENOUS

## 2019-02-26 MED ORDER — SODIUM CHLORIDE 0.9% FLUSH: 3.0000 mL | INTRAVENOUS | Status: DC | PRN

## 2019-02-26 MED ORDER — SODIUM CHLORIDE 0.9 % WEIGHT BASED INFUSION
1.0000 mL/kg/h | INTRAVENOUS | Status: DC
  Administered 2019-02-26: 1 mL/kg/h via INTRAVENOUS

## 2019-02-26 MED ORDER — NITROGLYCERIN 2 % TD OINT
1.0000 [in_us] | TOPICAL_OINTMENT | Freq: Four times a day (QID) | TRANSDERMAL | Status: DC
  Administered 2019-02-26 – 2019-02-27 (×4): 1 [in_us] via TOPICAL
  Filled 2019-02-26: qty 30

## 2019-02-26 MED ORDER — SODIUM CHLORIDE 0.9 % IV SOLN: 250.0000 mL | INTRAVENOUS | Status: DC | PRN

## 2019-02-26 MED ORDER — ASPIRIN 81 MG PO CHEW
81.0000 mg | CHEWABLE_TABLET | ORAL | Status: AC
  Administered 2019-02-27: 81 mg via ORAL
  Filled 2019-02-26: qty 1

## 2019-02-26 NOTE — Progress Notes (Signed)
[place] for heparin Indication: NSTEMI  Allergies  Allergen Reactions  . Lisinopril Hives       . Adhesive [Tape] Rash    Bandaids  . Aspirin Other (See Comments)    Burns stomach   . Neosporin [Neomycin-Bacitracin Zn-Polymyx] Rash    Patient Measurements: Height: 5\' 10"  (177.8 cm) Weight: 173 lb 1.6 oz (78.5 kg) IBW/kg (Calculated) : 73  Vital Signs: Temp: 98 F (36.7 C) (04/05 0426) Temp Source: Oral (04/05 0426) BP: 126/62 (04/05 0426) Pulse Rate: 69 (04/05 0426)  Labs: Recent Labs    02/24/19 1702 02/25/19 0532 02/25/19 0533 02/25/19 1230 02/26/19 0347 02/26/19 0348  HGB  --  13.5  --   --   --  13.0  HCT  --  40.5  --   --   --  39.1  PLT  --  93*  --   --   --  94*  HEPARINUNFRC 0.78*  --  0.58 0.70 0.54  --   CREATININE  --  1.11  --   --   --  0.99  TROPONINI 0.25*  --   --   --   --   --     Estimated Creatinine Clearance: 71.7 mL/min (by C-G formula based on SCr of 0.99 mg/dL).   Medical History: Past Medical History:  Diagnosis Date  . Allergic rhinitis   . Cancer ([place])    rectal cancer and found spots on his lungs also and is on chemo currently   . Diabetes mellitus without complication ([place])    type 2  . GERD (gastroesophageal reflux disease)   . Headache    hx of migraines yrs ago  . Hyperlipidemia   . Hypertension   . MVP (mitral valve prolapse)    dx 42 yrs no cardiologist    Medications:  Medications Prior to Admission  Medication Sig Dispense Refill Last Dose  . acetaminophen (TYLENOL) 500 MG tablet Take 1,000 mg by mouth every 6 (six) hours as needed for headache (pain).   02/23/2019 at Unknown time  . alum & mag hydroxide-simeth (MAALOX/MYLANTA) 200-200-20 MG/5ML suspension Take 10 mLs by mouth 3 (three) times daily as needed for indigestion or heartburn.   02/23/2019 at Unknown time  . AMBULATORY NON FORMULARY MEDICATION Currently on 3 chemo medication  and have a pump that is connected for  2 days. Pump bag is fluorouracil 3500mg  CIV mixed with sodium chloride 0.9%   02/22/2019  . diphenoxylate-atropine (LOMOTIL) 2.5-0.025 MG tablet Take 1 tablet by mouth 4 (four) times daily as needed for diarrhea or loose stools.   week ago  . gabapentin (NEURONTIN) 300 MG capsule Take 300 mg by mouth 2 (two) times daily.   02/23/2019 at pm  . hydrochlorothiazide (HYDRODIURIL) 25 MG tablet Take 25 mg by mouth daily.    02/23/2019 at am  . metFORMIN (GLUCOPHAGE-XR) 500 MG 24 hr tablet Take 500 mg by mouth daily with breakfast.    02/23/2019 at am  . metoprolol succinate (TOPROL-XL) 100 MG 24 hr tablet Take 100 mg by mouth daily. Take with or immediately following a meal.   02/23/2019 at 600  . pantoprazole (PROTONIX) 40 MG tablet Take 40 mg by mouth daily.   02/23/2019 at pm  . prochlorperazine (COMPAZINE) 10 MG tablet Take 10 mg by mouth 3 (three) times daily as needed for nausea/vomiting.   2 weeks ago  . spironolactone (ALDACTONE) 25 MG tablet Take 25 mg by mouth  daily.   02/23/2019 at am    Assessment: 73 y/o male with Stage IV colorectal cancer who presented to [facility] ED with chest pain. He is noted with NSTEMI and for cath on 4/6 -heparin level = 0.54  Goal of Therapy:  Heparin level 0.3-0.7 units/ml Monitor platelets by anticoagulation protocol: Yes   Plan:  Continue heparin gtt at 650 units/hr Daily heparin level and CBC F/U plan for cath tomorrow   Dakota Bennett, PharmD, BCPS, BCIDP Clinical Pharmacist 02/26/2019 9:16 AM

## 2019-02-26 NOTE — Progress Notes (Signed)
PROGRESS NOTE  Dakota Bennett NWG:[id number] DOB: 29-Apr-1946 DOA: 02/24/2019 PCP: Serita Grammes, MD  HPI/Recap of past 24 hours:  Sitting up on edge of the bed  on heprine drip, currently no chest pain,  hemodynamically stable  Assessment/Plan: Principal Problem:   NSTEMI (non-ST elevated myocardial infarction) ([place]) Active Problems:   Rectal cancer ([place])   Diabetes mellitus type 2, noninsulin dependent ([place])   Malignant neoplasm metastatic to lung ([place])   Hyponatremia   Thrombocytopenia ([facility])   Non-STEMI: -Patient presented with 3 weeks of intermittent pain, he is transferred from [place] to [facility] for cardiology evaluation -CTA coronary showed "Severe diffuse 3 vessel disease with a small dissection in the proximal left main artery." -He is started on heparin drip, asa and high dose statin, home meds toprol-xl continued, cardiology also started his on nitropaste -cardiology plan to do cardiac cath on Monday, management per cardiology  HTN: bp stable on metoprolol,  Home meds HCTZ and spironolactone held  Noninsulin dependent dm2, with peripheral neuropathy on neurontin  Home meds metformin held On ssi here  Chronic hyponatremia: mild, Stable.  Sodium is 132 Hold HCTZ  Chronic thrombocytopenia Platelets 90, chronic and stable  Stage IV rectal cancer (Dx 02/2017) withmetsto lung.Currently on palliative5-FU/leucovorin/bevacizumab q2weeky. Had 4500 + 540 Gy XRT).  Last chemo on 4/1 Appear to have good performance status,  Follow up with oncology on outpatient basis  Code Status: full  Family Communication: patient   Disposition Plan: not ready to discharge   Consultants:  cardiology  Procedures:  Cardiac cath planned on monday  Antibiotics:  none   Objective: BP 126/62 (BP Location: Right Arm)   Pulse 69   Temp 98 F (36.7 C) (Oral)   Ht 5\' 10"  (1.778 m)   Wt 78.5 kg   SpO2 93%   BMI 24.84 kg/m   Intake/Output Summary (Last 24  hours) at 02/26/2019 0935 Last data filed at 02/26/2019 0406 Gross per 24 hour  Intake 1188.66 ml  Output -  Net 1188.66 ml   Filed Weights   02/24/19 1453 02/25/19 0457 02/26/19 0426  Weight: 78.6 kg 79 kg 78.5 kg    Exam: Patient is examined daily including today on 02/26/2019, exams remain the same as of yesterday except that has changed    General:  NAD  Cardiovascular: RRR  Respiratory: CTABL  Abdomen: Soft/ND/NT, positive BS  Musculoskeletal: No Edema  Neuro: alert, oriented   Data Reviewed: Basic Metabolic Panel: Recent Labs  Lab 02/25/19 0532 02/26/19 0348  NA 133* 131*  K 4.2 4.1  CL 100 97*  CO2 23 24  GLUCOSE 93 114*  BUN 23 19  CREATININE 1.11 0.99  CALCIUM 9.1 8.8*  MG  --  2.1   Liver Function Tests: No results for input(s): AST, ALT, ALKPHOS, BILITOT, PROT, ALBUMIN in the last 168 hours. No results for input(s): LIPASE, AMYLASE in the last 168 hours. No results for input(s): AMMONIA in the last 168 hours. CBC: Recent Labs  Lab 02/21/19 1057 02/25/19 0532 02/26/19 0348  WBC 6.0 4.4 3.9*  HGB 14.7 13.5 13.0  HCT 46.8 40.5 39.1  MCV 96.3 93.1 91.4  PLT 87* 93* 94*   Cardiac Enzymes:   Recent Labs  Lab 02/24/19 1702  TROPONINI 0.25*   BNP (last 3 results) No results for input(s): BNP in the last 8760 hours.  ProBNP (last 3 results) No results for input(s): PROBNP in the last 8760 hours.  CBG: Recent Labs  Lab 02/25/19 [phone]  02/25/19 1138 02/25/19 1612 02/25/19 2112 02/26/19 0755  GLUCAP 91 148* 93 122* 121*    No results found for this or any previous visit (from the past 240 hour(s)).   Studies: No results found.  Scheduled Meds: . [START ON 02/27/2019] aspirin  81 mg Oral Pre-Cath  . aspirin EC  81 mg Oral Daily  . atorvastatin  80 mg Oral q1800  . insulin aspart  0-5 Units Subcutaneous QHS  . insulin aspart  0-9 Units Subcutaneous TID WC  . magnesium oxide  400 mg Oral Daily  . metoprolol succinate  100 mg Oral Daily   . nitroGLYCERIN  1 inch Topical Q6H  . sodium chloride flush  3 mL Intravenous Q12H    Continuous Infusions: . sodium chloride 10 mL/hr at 02/26/19 0406  . sodium chloride    . sodium chloride    . heparin 650 Units/hr (02/26/19 0932)     Time spent: 35mins I have personally reviewed and interpreted on  02/26/2019 daily labs, tele strips, imagings as discussed above under date review session and assessment and plans.  I reviewed all nursing notes, pharmacy notes, consultant notes,  vitals, pertinent old records  I have discussed plan of care as described above with RN , patient  on 02/26/2019   Florencia Reasons MD, PhD  [facility] Pager [phone]. If 7PM-7AM, please contact night-coverage at [email or web address], password [facility] 02/26/2019, 9:35 AM  LOS: 2 days

## 2019-02-26 NOTE — Progress Notes (Signed)
Progress Note  Patient Name: Dakota Bennett Date of Encounter: 02/26/2019  Primary Cardiologist: Dr Sallyanne Kuster  Subjective   CP with walking to the bathroom this AM; no dyspnea  Inpatient Medications    Scheduled Meds:  [START ON 02/27/2019] aspirin  81 mg Oral Pre-Cath   aspirin EC  81 mg Oral Daily   atorvastatin  80 mg Oral q1800   insulin aspart  0-5 Units Subcutaneous QHS   insulin aspart  0-9 Units Subcutaneous TID WC   magnesium oxide  400 mg Oral Daily   metoprolol succinate  100 mg Oral Daily   sodium chloride flush  3 mL Intravenous Q12H   Continuous Infusions:  sodium chloride 10 mL/hr at 02/26/19 0406   sodium chloride     sodium chloride     heparin 650 Units/hr (02/26/19 0406)   PRN Meds: sodium chloride, sodium chloride, acetaminophen **OR** acetaminophen, diphenoxylate-atropine, famotidine, prochlorperazine, sodium chloride flush   Vital Signs    Vitals:   02/25/19 1740 02/25/19 2112 02/25/19 2112 02/26/19 0426  BP: (!) 141/75 137/62 137/62 126/62  Pulse:  63 63 69  Temp:  97.6 F (36.4 C) 97.6 F (36.4 C) 98 F (36.7 C)  TempSrc:  Oral Oral Oral  SpO2: 100% 95% 95% 93%  Weight:    78.5 kg  Height:        Intake/Output Summary (Last 24 hours) at 02/26/2019 0815 Last data filed at 02/26/2019 0406 Gross per 24 hour  Intake 1424.66 ml  Output --  Net 1424.66 ml   Last 3 Weights 02/26/2019 02/25/2019 02/24/2019  Weight (lbs) 173 lb 1.6 oz 174 lb 1.6 oz 173 lb 4.8 oz  Weight (kg) 78.518 kg 78.971 kg 78.608 kg      Telemetry    Sinus rhythm with PVCs- Personally Reviewed  Physical Exam   GEN: WD/WN, NAD Neck: supple Cardiac: RRR, no murmur Respiratory: Clear to auscultation bilaterally; no wheeze GI: Soft, NT/ND MS: No edema Neuro:  Grossly intact   Labs    Chemistry Recent Labs  Lab 02/25/19 0532 02/26/19 0348  NA 133* 131*  K 4.2 4.1  CL 100 97*  CO2 23 24  GLUCOSE 93 114*  BUN 23 19  CREATININE 1.11 0.99  CALCIUM 9.1  8.8*  GFRNONAA >60 >60  GFRAA >60 >60  ANIONGAP 10 10     Hematology Recent Labs  Lab 02/21/19 1057 02/25/19 0532 02/26/19 0348  WBC 6.0 4.4 3.9*  RBC 4.86 4.35 4.28  HGB 14.7 13.5 13.0  HCT 46.8 40.5 39.1  MCV 96.3 93.1 91.4  MCH 30.2 31.0 30.4  MCHC 31.4 33.3 33.2  RDW 17.2* 17.4* 16.9*  PLT 87* 93* 94*    Cardiac Enzymes Recent Labs  Lab 02/24/19 1702  TROPONINI 0.25*    Radiology    Ct Coronary Morph W/cta Cor W/score W/ca W/cm &/or Wo/cm  Addendum Date: 02/25/2019   ADDENDUM REPORT: 02/25/2019 12:45 EXAM: OVER-READ INTERPRETATION  CT CHEST The following report is an over-read performed by radiologist Dr. Eben Burow [facility] on 02/25/2019. This over-read does not include interpretation of cardiac or coronary anatomy or pathology. The cardiac/coronary CT interpretation by the cardiologist is attached. COMPARISON:  None. FINDINGS: Multiple pulmonary nodules are seen in visualized portions of both lungs measuring up to 11 mm in the left lower lobe and 10 mm in the right lower lobe. A few of these nodules shows central areas of cavitation. These are suspicious for pulmonary metastases. Patient has history  of metastatic colon cancer. IMPRESSION: Multiple bilateral pulmonary nodules, highly suspicious for pulmonary metastases from patient's known colon carcinoma. These results will be called to the ordering clinician or representative by the Radiologist Assistant, and communication documented in the PACS or zVision Dashboard. Electronically Signed   By: Earle Gell M.D.   On: 02/25/2019 12:45   Result Date: 02/25/2019 CLINICAL DATA:  73 year old male with metastatic colorectal cancer, chest pain and troponin elevation. EXAM: Cardiac/Coronary  CT TECHNIQUE: The patient was scanned on a [employer]. FINDINGS: A 120 kV prospective scan was triggered in the descending thoracic aorta at 111 HU's. Axial non-contrast 3 mm slices were carried out through the heart.  The data set was analyzed on a dedicated work station and scored using the [place]. Gantry rotation speed was 250 msecs and collimation was .6 mm. No beta blockade and 0.8 mg of sl NTG was given. The 3D data set was reconstructed in 5% intervals of the 67-82 % of the R-R cycle. Diastolic phases were analyzed on a dedicated work station using MPR, MIP and VRT modes. The patient received 80 cc of contrast. Aorta: Normal size. Moderate diffuse atherosclerotic plaque and calcifications. No dissection. Aortic Valve:  Trileaflet.  No calcifications. Coronary Arteries:  Normal coronary origin.  Right dominance. RCA is a large dominant artery that gives rise to PDA and PLVB. Proximal RCA has a moderate non-calcified plaque with stenosis 50-69% with high risk features (napkin ring sign). This is followed by a severe mixed plaque suspicious for > 70% stenosis and a subtotal occlusion in the mid RCA. Distal RCA has moderate mixed plaque with stenosis 50-69%. PDA and PLA have mild mixed plaque. Left main is a large artery that gives rise to LAD and LCX arteries. Left main has a moderate calcified plaque with stenosis 25-50% and a possible small dissection at the proximal portion with stenosis 25-50%. LAD is a large vessel that gives rise to one large diagonal artery. LAD has diffuse moderate to severe plaque. Proximal to mid LAD has severe mixed plaque suspicious for stenosis > 70%. D1 is a large branch with moderate ostial plaque and stenosis 50-69%. LCX is a non-dominant artery that gives rise to two OM branches. Mid portion of the LCX artery has a severe calcified plaque with stenosis at least 50-69% but possibly > 70%. OM1,2 have mild diffuse plaque. Other findings: Normal pulmonary vein drainage into the left atrium. Normal let atrial appendage without a thrombus. Normal size of the pulmonary artery. IMPRESSION: 1. Coronary calcium score of 1106. This was 12 percentile for age and sex matched control. 2. Normal  coronary origin with right dominance. 3. Severe diffuse 3 vessel disease with a small dissection in the proximal left main artery. Electronically Signed: By: Ena Dawley On: 02/25/2019 09:20    Patient Profile     73 y.o. male with past medical history of metastatic rectal cancer, diabetes mellitus, hypertension, hyperlipidemia admitted with non-ST elevation myocardial infarction.  Transferred from [facility].  Echo there by report showed normal LV function.  Cardiac CTA here shows severe three-vessel coronary artery disease.  Assessment & Plan    1 non-ST elevation myocardial infarction-patient had recurrent chest pain walking to the bathroom this morning resolved after returning to bed.  Continue aspirin, heparin, statin and beta-blocker.  Add nitroglycerin paste.  If symptoms recur we will add IV nitroglycerin.  Cardiac CTA as noted show severe three-vessel coronary disease and possible small left main dissection.  He has  symptoms with minimal activity.  We will plan to proceed on Monday or sooner if needed.  The risks and benefits including myocardial infarction, CVA and death discussed and he agrees to proceed.  Note he would not be a candidate for coronary artery bypass and graft given metastatic rectal cancer but may be candidate for PCI.  Good functional capacity.  2 hypertension-continue Toprol.  Diuretics on hold prior to catheterization.  3 history of chronic thrombocytopenia-platelet count approximately 90,000.  Continue to follow while in-house.  4 history of metastatic rectal cancer-patient with good functional capacity.  Follow-up oncology.  For questions or updates, please contact [place] Please consult [email or web address] for contact info under        Signed, Kirk Ruths, MD  02/26/2019, 8:15 AM

## 2019-02-26 NOTE — H&P (View-Only) (Signed)
Progress Note  Patient Name: Dakota Bennett Date of Encounter: 02/26/2019  Primary Cardiologist: Dr Sallyanne Kuster  Subjective   CP with walking to the bathroom this AM; no dyspnea  Inpatient Medications    Scheduled Meds:  [START ON 02/27/2019] aspirin  81 mg Oral Pre-Cath   aspirin EC  81 mg Oral Daily   atorvastatin  80 mg Oral q1800   insulin aspart  0-5 Units Subcutaneous QHS   insulin aspart  0-9 Units Subcutaneous TID WC   magnesium oxide  400 mg Oral Daily   metoprolol succinate  100 mg Oral Daily   sodium chloride flush  3 mL Intravenous Q12H   Continuous Infusions:  sodium chloride 10 mL/hr at 02/26/19 0406   sodium chloride     sodium chloride     heparin 650 Units/hr (02/26/19 0406)   PRN Meds: sodium chloride, sodium chloride, acetaminophen **OR** acetaminophen, diphenoxylate-atropine, famotidine, prochlorperazine, sodium chloride flush   Vital Signs    Vitals:   02/25/19 1740 02/25/19 2112 02/25/19 2112 02/26/19 0426  BP: (!) 141/75 137/62 137/62 126/62  Pulse:  63 63 69  Temp:  97.6 F (36.4 C) 97.6 F (36.4 C) 98 F (36.7 C)  TempSrc:  Oral Oral Oral  SpO2: 100% 95% 95% 93%  Weight:    78.5 kg  Height:        Intake/Output Summary (Last 24 hours) at 02/26/2019 0815 Last data filed at 02/26/2019 0406 Gross per 24 hour  Intake 1424.66 ml  Output --  Net 1424.66 ml   Last 3 Weights 02/26/2019 02/25/2019 02/24/2019  Weight (lbs) 173 lb 1.6 oz 174 lb 1.6 oz 173 lb 4.8 oz  Weight (kg) 78.518 kg 78.971 kg 78.608 kg      Telemetry    Sinus rhythm with PVCs- Personally Reviewed  Physical Exam   GEN: WD/WN, NAD Neck: supple Cardiac: RRR, no murmur Respiratory: Clear to auscultation bilaterally; no wheeze GI: Soft, NT/ND MS: No edema Neuro:  Grossly intact   Labs    Chemistry Recent Labs  Lab 02/25/19 0532 02/26/19 0348  NA 133* 131*  K 4.2 4.1  CL 100 97*  CO2 23 24  GLUCOSE 93 114*  BUN 23 19  CREATININE 1.11 0.99  CALCIUM 9.1  8.8*  GFRNONAA >60 >60  GFRAA >60 >60  ANIONGAP 10 10     Hematology Recent Labs  Lab 02/21/19 1057 02/25/19 0532 02/26/19 0348  WBC 6.0 4.4 3.9*  RBC 4.86 4.35 4.28  HGB 14.7 13.5 13.0  HCT 46.8 40.5 39.1  MCV 96.3 93.1 91.4  MCH 30.2 31.0 30.4  MCHC 31.4 33.3 33.2  RDW 17.2* 17.4* 16.9*  PLT 87* 93* 94*    Cardiac Enzymes Recent Labs  Lab 02/24/19 1702  TROPONINI 0.25*    Radiology    Ct Coronary Morph W/cta Cor W/score W/ca W/cm &/or Wo/cm  Addendum Date: 02/25/2019   ADDENDUM REPORT: 02/25/2019 12:45 EXAM: OVER-READ INTERPRETATION  CT CHEST The following report is an over-read performed by radiologist Dr. Eben Burow [facility] on 02/25/2019. This over-read does not include interpretation of cardiac or coronary anatomy or pathology. The cardiac/coronary CT interpretation by the cardiologist is attached. COMPARISON:  None. FINDINGS: Multiple pulmonary nodules are seen in visualized portions of both lungs measuring up to 11 mm in the left lower lobe and 10 mm in the right lower lobe. A few of these nodules shows central areas of cavitation. These are suspicious for pulmonary metastases. Patient has history  of metastatic colon cancer. IMPRESSION: Multiple bilateral pulmonary nodules, highly suspicious for pulmonary metastases from patient's known colon carcinoma. These results will be called to the ordering clinician or representative by the Radiologist Assistant, and communication documented in the PACS or zVision Dashboard. Electronically Signed   By: Earle Gell M.D.   On: 02/25/2019 12:45   Result Date: 02/25/2019 CLINICAL DATA:  73 year old male with metastatic colorectal cancer, chest pain and troponin elevation. EXAM: Cardiac/Coronary  CT TECHNIQUE: The patient was scanned on a [employer]. FINDINGS: A 120 kV prospective scan was triggered in the descending thoracic aorta at 111 HU's. Axial non-contrast 3 mm slices were carried out through the heart.  The data set was analyzed on a dedicated work station and scored using the [place]. Gantry rotation speed was 250 msecs and collimation was .6 mm. No beta blockade and 0.8 mg of sl NTG was given. The 3D data set was reconstructed in 5% intervals of the 67-82 % of the R-R cycle. Diastolic phases were analyzed on a dedicated work station using MPR, MIP and VRT modes. The patient received 80 cc of contrast. Aorta: Normal size. Moderate diffuse atherosclerotic plaque and calcifications. No dissection. Aortic Valve:  Trileaflet.  No calcifications. Coronary Arteries:  Normal coronary origin.  Right dominance. RCA is a large dominant artery that gives rise to PDA and PLVB. Proximal RCA has a moderate non-calcified plaque with stenosis 50-69% with high risk features (napkin ring sign). This is followed by a severe mixed plaque suspicious for > 70% stenosis and a subtotal occlusion in the mid RCA. Distal RCA has moderate mixed plaque with stenosis 50-69%. PDA and PLA have mild mixed plaque. Left main is a large artery that gives rise to LAD and LCX arteries. Left main has a moderate calcified plaque with stenosis 25-50% and a possible small dissection at the proximal portion with stenosis 25-50%. LAD is a large vessel that gives rise to one large diagonal artery. LAD has diffuse moderate to severe plaque. Proximal to mid LAD has severe mixed plaque suspicious for stenosis > 70%. D1 is a large branch with moderate ostial plaque and stenosis 50-69%. LCX is a non-dominant artery that gives rise to two OM branches. Mid portion of the LCX artery has a severe calcified plaque with stenosis at least 50-69% but possibly > 70%. OM1,2 have mild diffuse plaque. Other findings: Normal pulmonary vein drainage into the left atrium. Normal let atrial appendage without a thrombus. Normal size of the pulmonary artery. IMPRESSION: 1. Coronary calcium score of 1106. This was 36 percentile for age and sex matched control. 2. Normal  coronary origin with right dominance. 3. Severe diffuse 3 vessel disease with a small dissection in the proximal left main artery. Electronically Signed: By: Ena Dawley On: 02/25/2019 09:20    Patient Profile     73 y.o. male with past medical history of metastatic rectal cancer, diabetes mellitus, hypertension, hyperlipidemia admitted with non-ST elevation myocardial infarction.  Transferred from [facility].  Echo there by report showed normal LV function.  Cardiac CTA here shows severe three-vessel coronary artery disease.  Assessment & Plan    1 non-ST elevation myocardial infarction-patient had recurrent chest pain walking to the bathroom this morning resolved after returning to bed.  Continue aspirin, heparin, statin and beta-blocker.  Add nitroglycerin paste.  If symptoms recur we will add IV nitroglycerin.  Cardiac CTA as noted show severe three-vessel coronary disease and possible small left main dissection.  He has  symptoms with minimal activity.  We will plan to proceed on Monday or sooner if needed.  The risks and benefits including myocardial infarction, CVA and death discussed and he agrees to proceed.  Note he would not be a candidate for coronary artery bypass and graft given metastatic rectal cancer but may be candidate for PCI.  Good functional capacity.  2 hypertension-continue Toprol.  Diuretics on hold prior to catheterization.  3 history of chronic thrombocytopenia-platelet count approximately 90,000.  Continue to follow while in-house.  4 history of metastatic rectal cancer-patient with good functional capacity.  Follow-up oncology.  For questions or updates, please contact [place] Please consult [email or web address] for contact info under        Signed, Kirk Ruths, MD  02/26/2019, 8:15 AM

## 2019-02-27 ENCOUNTER — Encounter (HOSPITAL_COMMUNITY)
Admission: AD | Disposition: A | Discharge status: Home / Self Care | Payer: Self-pay | Source: Other Acute Inpatient Hospital | Attending: Internal Medicine

## 2019-02-27 DIAGNOSIS — I2510 Atherosclerotic heart disease of native coronary artery without angina pectoris: Secondary | ICD-10-CM

## 2019-02-27 DIAGNOSIS — E785 Hyperlipidemia, unspecified: Secondary | ICD-10-CM

## 2019-02-27 DIAGNOSIS — D696 Thrombocytopenia, unspecified: Secondary | ICD-10-CM

## 2019-02-27 HISTORY — PX: CORONARY STENT INTERVENTION: CATH118234

## 2019-02-27 HISTORY — PX: LEFT HEART CATH AND CORONARY ANGIOGRAPHY: CATH118249

## 2019-02-27 LAB — GLUCOSE, CAPILLARY
Glucose-Capillary: 107 mg/dL — ABNORMAL HIGH (ref 70–99)
Glucose-Capillary: 120 mg/dL — ABNORMAL HIGH (ref 70–99)
Glucose-Capillary: 81 mg/dL (ref 70–99)
Glucose-Capillary: 182 mg/dL — ABNORMAL HIGH (ref 70–99)

## 2019-02-27 LAB — CBC
HCT: 36.2 % — ABNORMAL LOW (ref 39.0–52.0)
Hemoglobin: 11.8 g/dL — ABNORMAL LOW (ref 13.0–17.0)
MCH: 29.9 pg (ref 26.0–34.0)
MCHC: 32.6 g/dL (ref 30.0–36.0)
MCV: 91.9 fL (ref 80.0–100.0)
Platelets: 80 10*3/uL — ABNORMAL LOW (ref 150–400)
RBC: 3.94 MIL/uL — ABNORMAL LOW (ref 4.22–5.81)
RDW: 16.6 % — ABNORMAL HIGH (ref 11.5–15.5)
WBC: 3.2 10*3/uL — ABNORMAL LOW (ref 4.0–10.5)
nRBC: 0 % (ref 0.0–0.2)

## 2019-02-27 LAB — BASIC METABOLIC PANEL WITH GFR
Anion gap: 8 (ref 5–15)
BUN: 16 mg/dL (ref 8–23)
CO2: 24 mmol/L (ref 22–32)
Calcium: 8.6 mg/dL — ABNORMAL LOW (ref 8.9–10.3)
Chloride: 103 mmol/L (ref 98–111)
Creatinine, Ser: 0.91 mg/dL (ref 0.61–1.24)
GFR calc Af Amer: >60 mL/min
GFR calc non Af Amer: >60 mL/min
Glucose, Bld: 117 mg/dL — ABNORMAL HIGH (ref 70–99)
Potassium: 4.1 mmol/L (ref 3.5–5.1)
Sodium: 135 mmol/L (ref 135–145)

## 2019-02-27 LAB — POCT ACTIVATED CLOTTING TIME
Activated Clotting Time: 334 seconds
Activated Clotting Time: 296 seconds

## 2019-02-27 LAB — HEPARIN LEVEL (UNFRACTIONATED): Heparin Unfractionated: 0.33 [IU]/mL (ref 0.30–0.70)

## 2019-02-27 SURGERY — LEFT HEART CATH AND CORONARY ANGIOGRAPHY
Anesthesia: LOCAL

## 2019-02-27 MED ORDER — HEPARIN (PORCINE) IN NACL 1000-0.9 UT/500ML-% IV SOLN
INTRAVENOUS | Status: DC | PRN
  Administered 2019-02-27 (×3): 500 mL

## 2019-02-27 MED ORDER — ENOXAPARIN SODIUM 40 MG/0.4ML ~~LOC~~ SOLN
40.0000 mg | SUBCUTANEOUS | Status: DC
  Administered 2019-02-28: 40 mg via SUBCUTANEOUS
  Filled 2019-02-27: qty 0.4

## 2019-02-27 MED ORDER — NITROGLYCERIN 1 MG/10 ML FOR IR/CATH LAB
INTRA_ARTERIAL | Status: DC | PRN
  Administered 2019-02-27: 200 ug via INTRACORONARY

## 2019-02-27 MED ORDER — SODIUM CHLORIDE 0.9 % IV SOLN: 250.0000 mL | INTRAVENOUS | Status: DC | PRN

## 2019-02-27 MED ORDER — VERAPAMIL HCL 2.5 MG/ML IV SOLN
INTRAVENOUS | Status: AC
  Filled 2019-02-27: qty 2

## 2019-02-27 MED ORDER — LIDOCAINE HCL (PF) 1 % IJ SOLN
INTRAMUSCULAR | Status: AC
  Filled 2019-02-27: qty 30

## 2019-02-27 MED ORDER — FENTANYL CITRATE (PF) 100 MCG/2ML IJ SOLN
INTRAMUSCULAR | Status: AC
  Filled 2019-02-27: qty 2

## 2019-02-27 MED ORDER — SODIUM CHLORIDE 0.9% FLUSH: 3.0000 mL | INTRAVENOUS | Status: DC | PRN

## 2019-02-27 MED ORDER — IOHEXOL 350 MG/ML SOLN
INTRAVENOUS | Status: DC | PRN
  Administered 2019-02-27: 11:00:00 170 mL via INTRACARDIAC

## 2019-02-27 MED ORDER — HEPARIN (PORCINE) IN NACL 1000-0.9 UT/500ML-% IV SOLN
INTRAVENOUS | Status: AC
  Filled 2019-02-27: qty 1000

## 2019-02-27 MED ORDER — ASPIRIN 81 MG PO CHEW: 81.0000 mg | CHEWABLE_TABLET | Freq: Every day | ORAL | Status: DC

## 2019-02-27 MED ORDER — HYDRALAZINE HCL 20 MG/ML IJ SOLN: 10.0000 mg | INTRAMUSCULAR | Status: AC | PRN

## 2019-02-27 MED ORDER — LIDOCAINE HCL (PF) 1 % IJ SOLN
INTRAMUSCULAR | Status: DC | PRN
  Administered 2019-02-27: 2 mL via INTRADERMAL

## 2019-02-27 MED ORDER — CLOPIDOGREL BISULFATE 300 MG PO TABS
ORAL_TABLET | ORAL | Status: DC | PRN
  Administered 2019-02-27: 600 mg via ORAL

## 2019-02-27 MED ORDER — VERAPAMIL HCL 2.5 MG/ML IV SOLN
INTRAVENOUS | Status: DC | PRN
  Administered 2019-02-27: 10 mL via INTRA_ARTERIAL

## 2019-02-27 MED ORDER — CLOPIDOGREL BISULFATE 300 MG PO TABS
ORAL_TABLET | ORAL | Status: AC
  Filled 2019-02-27: qty 1

## 2019-02-27 MED ORDER — SODIUM CHLORIDE 0.9% FLUSH
3.0000 mL | Freq: Two times a day (BID) | INTRAVENOUS | Status: DC
  Administered 2019-02-28: 10:00:00 3 mL via INTRAVENOUS

## 2019-02-27 MED ORDER — FENTANYL CITRATE (PF) 100 MCG/2ML IJ SOLN
INTRAMUSCULAR | Status: DC | PRN
  Administered 2019-02-27 (×3): 25 ug via INTRAVENOUS

## 2019-02-27 MED ORDER — SODIUM CHLORIDE 0.9 % WEIGHT BASED INFUSION: 1.0000 mL/kg/h | INTRAVENOUS | Status: AC

## 2019-02-27 MED ORDER — ISOSORBIDE MONONITRATE ER 30 MG PO TB24
30.0000 mg | ORAL_TABLET | Freq: Every day | ORAL | Status: DC
  Administered 2019-02-27 – 2019-02-28 (×2): 30 mg via ORAL
  Filled 2019-02-27 (×2): qty 1

## 2019-02-27 MED ORDER — MIDAZOLAM HCL 2 MG/2ML IJ SOLN
INTRAMUSCULAR | Status: AC
  Filled 2019-02-27: qty 2

## 2019-02-27 MED ORDER — CLOPIDOGREL BISULFATE 75 MG PO TABS
75.0000 mg | ORAL_TABLET | Freq: Every day | ORAL | Status: DC
  Administered 2019-02-28: 75 mg via ORAL
  Filled 2019-02-27: qty 1

## 2019-02-27 MED ORDER — HEPARIN SODIUM (PORCINE) 1000 UNIT/ML IJ SOLN
INTRAMUSCULAR | Status: DC | PRN
  Administered 2019-02-27 (×2): 4000 [IU] via INTRAVENOUS

## 2019-02-27 MED ORDER — MIDAZOLAM HCL 2 MG/2ML IJ SOLN
INTRAMUSCULAR | Status: DC | PRN
  Administered 2019-02-27 (×2): 1 mg via INTRAVENOUS

## 2019-02-27 SURGICAL SUPPLY — 24 items
BALLN SAPPHIRE 2.5X20 (BALLOONS) ×2
BALLN SAPPHIRE ~~LOC~~ 3.0X15 (BALLOONS) ×2 IMPLANT
BALLN ~~LOC~~ EMERGE MR 3.5X20 (BALLOONS) ×2
BALLOON SAPPHIRE 2.5X20 (BALLOONS) ×1 IMPLANT
BALLOON ~~LOC~~ EMERGE MR 3.5X20 (BALLOONS) ×1 IMPLANT
CATH 5FR JL3.5 JR4 ANG PIG MP (CATHETERS) ×2 IMPLANT
CATH LAUNCHER 6FR AL1 (CATHETERS) ×1 IMPLANT
CATH LAUNCHER 6FR HS (CATHETERS) ×2 IMPLANT
CATHETER LAUNCHER 6FR AL1 (CATHETERS) ×2
COVER DOME SNAP 22 D (MISCELLANEOUS) ×2 IMPLANT
DEVICE RAD COMP TR BAND LRG (VASCULAR PRODUCTS) ×2 IMPLANT
GLIDESHEATH SLEND SS 6F .021 (SHEATH) ×2 IMPLANT
GUIDELINER 6F (CATHETERS) ×2 IMPLANT
GUIDEWIRE INQWIRE 1.5J.035X260 (WIRE) ×1 IMPLANT
INQWIRE 1.5J .035X260CM (WIRE) ×2
KIT ENCORE 26 ADVANTAGE (KITS) ×2 IMPLANT
KIT HEART LEFT (KITS) ×2 IMPLANT
PACK CARDIAC CATHETERIZATION (CUSTOM PROCEDURE TRAY) ×2 IMPLANT
STENT RESOLUTE ONYX 2.5X18 (Permanent Stent) ×2 IMPLANT
STENT RESOLUTE ONYX 3.5X38 (Permanent Stent) ×2 IMPLANT
STENT RESOLUTE ONYX3.0X38 (Permanent Stent) ×2 IMPLANT
TRANSDUCER W/STOPCOCK (MISCELLANEOUS) ×2 IMPLANT
TUBING CIL FLEX 10 FLL-RA (TUBING) ×2 IMPLANT
WIRE ASAHI PROWATER 180CM (WIRE) ×4 IMPLANT

## 2019-02-27 NOTE — Progress Notes (Signed)
Progress Note  Patient Name: Dakota Bennett Date of Encounter: 02/27/2019  Primary Cardiologist: Dr. Sallyanne Kuster  Subjective   No recurrent chest pain; s/p PCI today  Inpatient Medications    Scheduled Meds: . aspirin EC  81 mg Oral Daily  . atorvastatin  80 mg Oral q1800  . [START ON 02/28/2019] clopidogrel  75 mg Oral Q breakfast  . [START ON 02/28/2019] enoxaparin (LOVENOX) injection  40 mg Subcutaneous Q24H  . insulin aspart  0-5 Units Subcutaneous QHS  . insulin aspart  0-9 Units Subcutaneous TID WC  . isosorbide mononitrate  30 mg Oral Daily  . magnesium oxide  400 mg Oral Daily  . metoprolol succinate  100 mg Oral Daily  . sodium chloride flush  3 mL Intravenous Q12H   Continuous Infusions: . sodium chloride 10 mL/hr at 02/26/19 0406  . sodium chloride    . sodium chloride     PRN Meds: sodium chloride, sodium chloride, acetaminophen **OR** acetaminophen, diphenoxylate-atropine, famotidine, hydrALAZINE, prochlorperazine, sodium chloride flush   Vital Signs    Vitals:   02/27/19 1051 02/27/19 1056 02/27/19 1101 02/27/19 1106  BP: (!) 168/68 (!) 168/71 (!) 164/72 (!) 166/73  Pulse: (!) 56 (!) 59 63 71  Resp: (!) 7 14 (!) 0 (!) 6  Temp:      TempSrc:      SpO2: 99% 99% 97% (!) 0%  Weight:      Height:        Intake/Output Summary (Last 24 hours) at 02/27/2019 1609 Last data filed at 02/27/2019 0500 Gross per 24 hour  Intake 1065.09 ml  Output -  Net 1065.09 ml    I/O since admission:   Filed Weights   02/25/19 0457 02/26/19 0426 02/27/19 0553  Weight: 79 kg 78.5 kg 78.9 kg    Telemetry    Sinus- Personally Reviewed  ECG    ECG (independently read by me): Normal sinus rhythm with left axis deviation and left bundle branch block.  Physical Exam   BP (!) 166/73   Pulse 71   Temp 97.8 F (36.6 C) (Oral)   Resp (!) 6   Ht 5\' 10"  (1.778 m)   Wt 78.9 kg   SpO2 (!) 0%   BMI 24.95 kg/m  General: Alert, oriented, no distress.  Skin: normal turgor, no  rashes, warm and dry HEENT: Normocephalic, atraumatic. Pupils equal round and reactive to light; sclera anicteric; extraocular muscles intact;  Nose without nasal septal hypertrophy Mouth/Parynx benign;  Neck: No JVD, no carotid bruits; normal carotid upstroke Lungs: clear to ausculatation and percussion; no wheezing or rales Chest wall: without tenderness to palpitation Heart: PMI not displaced, RRR, s1 s2 normal, 1/6 systolic murmur, no diastolic murmur, no rubs, gallops, thrills, or heaves Abdomen: soft, nontender; no hepatosplenomehaly, BS+; abdominal aorta nontender and not dilated by palpation. Back: no CVA tenderness Pulses 2+ R radial site stable; area of ecchymosis in upper arm Musculoskeletal: full range of motion, normal strength, no joint deformities Extremities: no clubbing cyanosis or edema, Homan's sign negative  Neurologic: grossly nonfocal; Cranial nerves grossly wnl Psychologic: Normal mood and affect  Labs    Chemistry Recent Labs  Lab 02/25/19 0532 02/26/19 0348 02/27/19 0349  NA 133* 131* 135  K 4.2 4.1 4.1  CL 100 97* 103  CO2 23 24 24   GLUCOSE 93 114* 117*  BUN 23 19 16   CREATININE 1.11 0.99 0.91  CALCIUM 9.1 8.8* 8.6*  GFRNONAA >60 >60 >60  GFRAA >60 >60 >  60  ANIONGAP 10 10 8      Hematology Recent Labs  Lab 02/25/19 0532 02/26/19 0348 02/27/19 0349  WBC 4.4 3.9* 3.2*  RBC 4.35 4.28 3.94*  HGB 13.5 13.0 11.8*  HCT 40.5 39.1 36.2*  MCV 93.1 91.4 91.9  MCH 31.0 30.4 29.9  MCHC 33.3 33.2 32.6  RDW 17.4* 16.9* 16.6*  PLT 93* 94* 80*    Cardiac Enzymes Recent Labs  Lab 02/24/19 1702  TROPONINI 0.25*   No results for input(s): TROPIPOC in the last 168 hours.   BNPNo results for input(s): BNP, PROBNP in the last 168 hours.   DDimer No results for input(s): DDIMER in the last 168 hours.   Lipid Panel     Component Value Date/Time   CHOL 150 02/26/2019 0348   TRIG 88 02/26/2019 0348   HDL 35 (L) 02/26/2019 0348   CHOLHDL 4.3  02/26/2019 0348   VLDL 18 02/26/2019 0348   LDLCALC 97 02/26/2019 0348     Radiology    No results found.  Cardiac Studies    Prox LAD lesion is 70% stenosed.  Ost 1st Mrg lesion is 50% stenosed.  RPDA lesion is 80% stenosed.  Prox RCA to Mid RCA lesion is 90% stenosed.  Post intervention, there is a 0% residual stenosis.  A drug-eluting stent was successfully placed using a STENT RESOLUTE ONYX 3.5X38.  A drug-eluting stent was successfully placed using a STENT RESOLUTE OMVE7.2C94.  A drug-eluting stent was successfully placed using a STENT RESOLUTE ONYX 2.5X18.  The left ventricular systolic function is normal.  LV end diastolic pressure is normal.  The left ventricular ejection fraction is 55-65% by visual estimate.   1. 2 vessel obstructive CAD    - 70% mid LAD-heavily calcified    - 90% segmental stenosis in the mid RCA    - 80% mid PDA 2. Normal LV function 3. Normal LVEDP 4. Successful PCI of the RCA. Procedure complicated by acute dissection and vessel closure. This was successfully managed with DES x 3.     Intervention     Implants      Patient Profile   73 y.o. male with past medical history of metastatic rectal cancer, diabetes mellitus, hypertension, hyperlipidemia admitted with non-ST elevation myocardial infarction.  Transferred from [facility].  Echo there by report showed normal LV function.  Cardiac CTA here shows severe three-vessel coronary artery disease.  Assessment & Plan    1. NSTEMI: Multivessel CAD as noted by catheterization above.  Total RCA occlusion and etiology to the non-ST segment MI.  PCI performed as noted above complicated by dissection requiring ultimate placement of 3 DES stents stenting to the PDA takeoff.  Medical therapy for concomitant CAD involving the LAD, obtuse marginal and PDA vessel.  2. Hypertension: BP elevated earlier today, will monitor and if still elevated tomorrow additional medication adjustment  will be necessary.  3. Thrombocytopenia: Platelet count 80,000 today; on ASA/Plavix will need to monitor platelet count  4.  Hyperlipidemia with target LDL less than 70       5.  H/O metastatic rectal cancer        Probable DC tomorrow if remains stable.  Signed, Troy Sine, MD, [facility] 02/27/2019, 4:09 PM

## 2019-02-27 NOTE — Plan of Care (Signed)
  Problem: Education: Goal: Knowledge of General Education information will improve Description Including pain rating scale, medication(s)/side effects and non-pharmacologic comfort measures Outcome: Progressing   Problem: Health Behavior/Discharge Planning: Goal: Ability to manage health-related needs will improve Outcome: Progressing   Problem: Clinical Measurements: Goal: Ability to maintain clinical measurements within normal limits will improve Outcome: Progressing Goal: Will remain free from infection Outcome: Progressing Goal: Respiratory complications will improve Outcome: Progressing Goal: Cardiovascular complication will be avoided Outcome: Progressing   Problem: Skin Integrity: Goal: Risk for impaired skin integrity will decrease Outcome: Progressing   Problem: Activity: Goal: Risk for activity intolerance will decrease Outcome: Completed/Met   Problem: Nutrition: Goal: Adequate nutrition will be maintained Outcome: Completed/Met   Problem: Coping: Goal: Level of anxiety will decrease Outcome: Completed/Met   Problem: Elimination: Goal: Will not experience complications related to bowel motility Outcome: Completed/Met Goal: Will not experience complications related to urinary retention Outcome: Completed/Met   Problem: Pain Managment: Goal: General experience of comfort will improve Outcome: Completed/Met   Problem: Safety: Goal: Ability to remain free from injury will improve Outcome: Completed/Met

## 2019-02-27 NOTE — Progress Notes (Signed)
[place] for heparin Indication: NSTEMI  Allergies  Allergen Reactions  . Lisinopril Hives       . Adhesive [Tape] Rash    Bandaids  . Aspirin Other (See Comments)    Burns stomach   . Neosporin [Neomycin-Bacitracin Zn-Polymyx] Rash    Patient Measurements: Height: 5\' 10"  (177.8 cm) Weight: 173 lb 14.4 oz (78.9 kg) IBW/kg (Calculated) : 73  Vital Signs: Temp: 97.8 F (36.6 C) (04/06 0553) Temp Source: Oral (04/06 0553) BP: 146/66 (04/06 0553) Pulse Rate: 69 (04/06 0553)  Labs: Recent Labs    02/24/19 1702  02/25/19 0532  02/25/19 1230 02/26/19 0347 02/26/19 0348 02/27/19 0348 02/27/19 0349  HGB  --    < > 13.5  --   --   --  13.0  --  11.8*  HCT  --   --  40.5  --   --   --  39.1  --  36.2*  PLT  --   --  93*  --   --   --  94*  --  80*  HEPARINUNFRC 0.78*  --   --    < > 0.70 0.54  --  0.33  --   CREATININE  --   --  1.11  --   --   --  0.99  --  0.91  TROPONINI 0.25*  --   --   --   --   --   --   --   --    < > = values in this interval not displayed.    Estimated Creatinine Clearance: 78 mL/min (by C-G formula based on SCr of 0.91 mg/dL).   Medical History: Past Medical History:  Diagnosis Date  . Allergic rhinitis   . Cancer ([place])    rectal cancer and found spots on his lungs also and is on chemo currently   . Diabetes mellitus without complication ([place])    type 2  . GERD (gastroesophageal reflux disease)   . Headache    hx of migraines yrs ago  . Hyperlipidemia   . Hypertension   . MVP (mitral valve prolapse)    dx 42 yrs no cardiologist    Medications:  Medications Prior to Admission  Medication Sig Dispense Refill Last Dose  . acetaminophen (TYLENOL) 500 MG tablet Take 1,000 mg by mouth every 6 (six) hours as needed for headache (pain).   02/23/2019 at Unknown time  . alum & mag hydroxide-simeth (MAALOX/MYLANTA) 200-200-20 MG/5ML suspension Take 10 mLs by mouth 3 (three) times daily as needed for  indigestion or heartburn.   02/23/2019 at Unknown time  . AMBULATORY NON FORMULARY MEDICATION Currently on 3 chemo medication  and have a pump that is connected for 2 days. Pump bag is fluorouracil 3500mg  CIV mixed with sodium chloride 0.9%   02/22/2019  . diphenoxylate-atropine (LOMOTIL) 2.5-0.025 MG tablet Take 1 tablet by mouth 4 (four) times daily as needed for diarrhea or loose stools.   week ago  . gabapentin (NEURONTIN) 300 MG capsule Take 300 mg by mouth 2 (two) times daily.   02/23/2019 at pm  . hydrochlorothiazide (HYDRODIURIL) 25 MG tablet Take 25 mg by mouth daily.    02/23/2019 at am  . metFORMIN (GLUCOPHAGE-XR) 500 MG 24 hr tablet Take 500 mg by mouth daily with breakfast.    02/23/2019 at am  . metoprolol succinate (TOPROL-XL) 100 MG 24 hr tablet Take 100 mg by mouth daily. Take with or immediately  following a meal.   02/23/2019 at 600  . pantoprazole (PROTONIX) 40 MG tablet Take 40 mg by mouth daily.   02/23/2019 at pm  . prochlorperazine (COMPAZINE) 10 MG tablet Take 10 mg by mouth 3 (three) times daily as needed for nausea/vomiting.   2 weeks ago  . spironolactone (ALDACTONE) 25 MG tablet Take 25 mg by mouth daily.   02/23/2019 at am    Assessment: 73 y/o male with Stage IV colorectal cancer who presented to [facility] ED with chest pain. He is noted with NSTEMI and for cath today.  -heparin level = 0.33  Goal of Therapy:  Heparin level 0.3-0.7 units/ml Monitor platelets by anticoagulation protocol: Yes   Plan:  Continue heparin gtt at 650 units/hr Daily heparin level and CBC Will follow plans post cath  Hildred Laser, PharmD Clinical Pharmacist **Pharmacist phone directory can now be found on [email or web address] ([email or web address]).  Listed under [place].

## 2019-02-27 NOTE — Interval H&P Note (Signed)
History and Physical Interval Note:  02/27/2019 9:29 AM  Dakota Bennett  has presented today for surgery, with the diagnosis of NSTEMI.  The various methods of treatment have been discussed with the patient and family. After consideration of risks, benefits and other options for treatment, the patient has consented to  Procedure(s): LEFT HEART CATH AND CORONARY ANGIOGRAPHY (N/A) as a surgical intervention.  The patient's history has been reviewed, patient examined, no change in status, stable for surgery.  I have reviewed the patient's chart and labs.  Questions were answered to the patient's satisfaction.   Cath Lab Visit (complete for each Cath Lab visit)  Clinical Evaluation Leading to the Procedure:   ACS: Yes.    Non-ACS:    Anginal Classification: CCS IV  Anti-ischemic medical therapy: Maximal Therapy (2 or more classes of medications)  Non-Invasive Test Results: High-risk stress test findings: cardiac mortality >3%/year  Prior CABG: No previous CABG       Collier [facility] 02/27/2019 9:29 AM

## 2019-02-27 NOTE — Progress Notes (Signed)
PROGRESS NOTE  Dakota Bennett EXH:[id number] DOB: 1946/02/11 DOA: 02/24/2019 PCP: Dakota Grammes, MD  HPI/Recap of past 24 hours:   Patient is seen after returned from cardiac cath, he is now off heparin drip. He denies pain, no sob, vital signs are stable He reports mild nose bleed  Assessment/Plan: Principal Problem:   NSTEMI (non-ST elevated myocardial infarction) ([place]) Active Problems:   Rectal cancer ([place])   Diabetes mellitus type 2, noninsulin dependent ([place])   Malignant neoplasm metastatic to lung ([facility])   Hyponatremia   Thrombocytopenia ([facility])   Non-STEMI: -Patient presented with 3 weeks of intermittent pain, he is transferred from [place] to [facility] for cardiology evaluation -CTA coronary showed "Severe diffuse 3 vessel disease with a small dissection in the proximal left main artery." -He is treated with  heparin drip prior to cardiac cath,he is on  asa and high dose statin, home meds toprol-xl continued, he is started on imdur per cardiology - cardiac cath on Monday with "Successful PCI of the RCA. Procedure complicated by acute dissection and vessel closure. This was successfully managed with DES x 3. " per cardiology Dr Martinique "DAPT for one year. I would treat the residual disease in the LAD and PDA medically. The LAD would require atherectomy to treat. Given his overall prognosis I would not treat this with PCI unless he has refractory angina. " -he is started on plavix   HTN: bp stable on metoprolol, imdur Home meds HCTZ and spironolactone held since admission  Noninsulin dependent dm2, with peripheral neuropathy on neurontin  a1c 6.8 Home meds metformin held On ssi here  Chronic hyponatremia: mild, Stable.  Sodium is 132 Hold HCTZ, sodium normalized  Chronic thrombocytopenia Platelets 90, chronic and stable  Stage IV rectal cancer (Dx 02/2017) withmetsto lung.Currently on palliative5-FU/leucovorin/bevacizumab q2weeky. Had 4500 + 540 Gy XRT).  Last  chemo on 4/1 Appear to have good performance status,  Follow up with oncology on outpatient basis  Code Status: full  Family Communication: patient   Disposition Plan: home in am with cardiology clearance    Consultants:  cardiology  Procedures:  Cardiac cath with stent placement on 4/6  Antibiotics:  none   Objective: BP (!) 166/73   Pulse 71   Temp 97.8 F (36.6 C) (Oral)   Resp (!) 6   Ht 5\' 10"  (1.778 m)   Wt 78.9 kg   SpO2 (!) 0%   BMI 24.95 kg/m   Intake/Output Summary (Last 24 hours) at 02/27/2019 1239 Last data filed at 02/27/2019 0500 Gross per 24 hour  Intake 1065.09 ml  Output -  Net 1065.09 ml   Filed Weights   02/25/19 0457 02/26/19 0426 02/27/19 0553  Weight: 79 kg 78.5 kg 78.9 kg    Exam: Patient is examined daily including today on 02/27/2019, exams remain the same as of yesterday except that has changed    General:  NAD  Cardiovascular: RRR  Respiratory: CTABL  Abdomen: Soft/ND/NT, positive BS  Musculoskeletal: No Edema  Neuro: alert, oriented   Data Reviewed: Basic Metabolic Panel: Recent Labs  Lab 02/25/19 0532 02/26/19 0348 02/27/19 0349  NA 133* 131* 135  K 4.2 4.1 4.1  CL 100 97* 103  CO2 23 24 24   GLUCOSE 93 114* 117*  BUN 23 19 16   CREATININE 1.11 0.99 0.91  CALCIUM 9.1 8.8* 8.6*  MG  --  2.1  --    Liver Function Tests: No results for input(s): AST, ALT, ALKPHOS, BILITOT, PROT, ALBUMIN in the  last 168 hours. No results for input(s): LIPASE, AMYLASE in the last 168 hours. No results for input(s): AMMONIA in the last 168 hours. CBC: Recent Labs  Lab 02/21/19 1057 02/25/19 0532 02/26/19 0348 02/27/19 0349  WBC 6.0 4.4 3.9* 3.2*  HGB 14.7 13.5 13.0 11.8*  HCT 46.8 40.5 39.1 36.2*  MCV 96.3 93.1 91.4 91.9  PLT 87* 93* 94* 80*   Cardiac Enzymes:   Recent Labs  Lab 02/24/19 1702  TROPONINI 0.25*   BNP (last 3 results) No results for input(s): BNP in the last 8760 hours.  ProBNP (last 3 results) No  results for input(s): PROBNP in the last 8760 hours.  CBG: Recent Labs  Lab 02/26/19 1205 02/26/19 1620 02/26/19 2050 02/27/19 0755 02/27/19 1130  GLUCAP 111* 169* 137* 107* 120*    No results found for this or any previous visit (from the past 240 hour(s)).   Studies: No results found.  Scheduled Meds: . aspirin  81 mg Oral Daily  . [MAR Hold] aspirin EC  81 mg Oral Daily  . [MAR Hold] atorvastatin  80 mg Oral q1800  . [START ON 02/28/2019] clopidogrel  75 mg Oral Q breakfast  . [START ON 02/28/2019] enoxaparin (LOVENOX) injection  40 mg Subcutaneous Q24H  . [MAR Hold] insulin aspart  0-5 Units Subcutaneous QHS  . [MAR Hold] insulin aspart  0-9 Units Subcutaneous TID WC  . isosorbide mononitrate  30 mg Oral Daily  . [MAR Hold] magnesium oxide  400 mg Oral Daily  . [MAR Hold] metoprolol succinate  100 mg Oral Daily  . sodium chloride flush  3 mL Intravenous Q12H    Continuous Infusions: . [MAR Hold] sodium chloride 10 mL/hr at 02/26/19 0406  . sodium chloride    . sodium chloride       Time spent: 74mins I have personally reviewed and interpreted on  02/27/2019 daily labs, tele strips, imagings as discussed above under date review session and assessment and plans.  I reviewed all nursing notes, pharmacy notes, consultant notes,  vitals, pertinent old records  I have discussed plan of care as described above with RN , patient  on 02/27/2019   Florencia Reasons MD, PhD  [facility] Pager [phone]. If 7PM-7AM, please contact night-coverage at [email or web address], password [facility] 02/27/2019, 12:39 PM  LOS: 3 days

## 2019-02-28 ENCOUNTER — Encounter (HOSPITAL_COMMUNITY): Payer: Self-pay | Admitting: Cardiology

## 2019-02-28 DIAGNOSIS — Z955 Presence of coronary angioplasty implant and graft: Secondary | ICD-10-CM

## 2019-02-28 LAB — BASIC METABOLIC PANEL
Anion gap: 7 (ref 5–15)
BUN: 12 mg/dL (ref 8–23)
CO2: 21 mmol/L — ABNORMAL LOW (ref 22–32)
Calcium: 8.5 mg/dL — ABNORMAL LOW (ref 8.9–10.3)
Chloride: 107 mmol/L (ref 98–111)
Creatinine, Ser: 0.9 mg/dL (ref 0.61–1.24)
GFR calc Af Amer: >60 mL/min (ref >60)
GFR calc non Af Amer: >60 mL/min (ref >60)
Glucose, Bld: 110 mg/dL — ABNORMAL HIGH (ref 70–99)
Potassium: 4.2 mmol/L (ref 3.5–5.1)
Sodium: 135 mmol/L (ref 135–145)

## 2019-02-28 LAB — CBC
HCT: 34.4 % — ABNORMAL LOW (ref 39.0–52.0)
Hemoglobin: 11.1 g/dL — ABNORMAL LOW (ref 13.0–17.0)
MCH: 29.8 pg (ref 26.0–34.0)
MCHC: 32.3 g/dL (ref 30.0–36.0)
MCV: 92.2 fL (ref 80.0–100.0)
Platelets: 73 10*3/uL — ABNORMAL LOW (ref 150–400)
RBC: 3.73 MIL/uL — ABNORMAL LOW (ref 4.22–5.81)
RDW: 16.5 % — ABNORMAL HIGH (ref 11.5–15.5)
WBC: 3.5 10*3/uL — ABNORMAL LOW (ref 4.0–10.5)
nRBC: 0 % (ref 0.0–0.2)

## 2019-02-28 LAB — GLUCOSE, CAPILLARY: Glucose-Capillary: 111 mg/dL — ABNORMAL HIGH (ref 70–99)

## 2019-02-28 LAB — MAGNESIUM: Magnesium: 1.8 mg/dL (ref 1.7–2.4)

## 2019-02-28 LAB — TROPONIN I: Troponin I: 0.13 ng/mL (ref <0.03)

## 2019-02-28 MED ORDER — AMLODIPINE BESYLATE 5 MG PO TABS: 5.0000 mg | ORAL_TABLET | Freq: Every day | ORAL | 0 refills | Status: DC

## 2019-02-28 MED ORDER — CLOPIDOGREL BISULFATE 75 MG PO TABS: 75.0000 mg | ORAL_TABLET | Freq: Every day | ORAL | 0 refills | Status: DC

## 2019-02-28 MED ORDER — FAMOTIDINE 20 MG PO TABS: 20.0000 mg | ORAL_TABLET | ORAL | 0 refills | Status: DC | PRN

## 2019-02-28 MED ORDER — HEPARIN SOD (PORK) LOCK FLUSH 100 UNIT/ML IV SOLN: 500.0000 [IU] | INTRAVENOUS | Status: DC | PRN

## 2019-02-28 MED ORDER — MAGNESIUM OXIDE 400 MG PO TABS: 400.0000 mg | ORAL_TABLET | Freq: Every day | ORAL | 0 refills | Status: DC

## 2019-02-28 MED ORDER — ISOSORBIDE MONONITRATE ER 30 MG PO TB24: 30.0000 mg | ORAL_TABLET | Freq: Every day | ORAL | 0 refills | Status: DC

## 2019-02-28 MED ORDER — AMLODIPINE BESYLATE 5 MG PO TABS
5.0000 mg | ORAL_TABLET | Freq: Every day | ORAL | Status: DC
  Administered 2019-02-28: 13:00:00 5 mg via ORAL
  Filled 2019-02-28: qty 1

## 2019-02-28 MED ORDER — ATORVASTATIN CALCIUM 80 MG PO TABS: 80.0000 mg | ORAL_TABLET | Freq: Every day | ORAL | 0 refills | Status: DC

## 2019-02-28 MED ORDER — HEPARIN SOD (PORK) LOCK FLUSH 100 UNIT/ML IV SOLN
500.0000 [IU] | INTRAVENOUS | Status: AC | PRN
  Administered 2019-02-28: 11:00:00 500 [IU]

## 2019-02-28 MED ORDER — ASPIRIN 81 MG PO TBEC: 81.0000 mg | DELAYED_RELEASE_TABLET | Freq: Every day | ORAL | 0 refills | Status: DC

## 2019-02-28 NOTE — Progress Notes (Signed)
[id number] Called pt in room for education as not doing face to face ed at this time due to COVID guidelines. Discussed MI restrictions, ex ed, NTG use, importance of plavix with stent, CRP 2 and briefly discussed healthy food choices. Will give pt's RN written materials to give pt which will include heart healthy and diabetic diets,MI booklet, written ex.. Discussed CRP 2 and referred to [facility] program. Pt voiced understanding of ex ed which is walking guidelines. Graylon Good RN BSN 02/28/2019 9:01 AM

## 2019-02-28 NOTE — Discharge Instructions (Signed)

## 2019-02-28 NOTE — Discharge Summary (Signed)
Discharge Summary  Dakota Bennett TGG:[id number] DOB: 31-Dec-1945  PCP: Serita Grammes, MD  Admit date: 02/24/2019 Discharge date: 02/28/2019  Time spent: 3mins, more than 50% time spent on coordination of care.  Recommendations for Outpatient Follow-up:  1. F/u with PCP within a week  for hospital discharge follow up, repeat cbc/bmp at follow up. 2. F/u with oncology for chemotherapy q2weeks 3. F/u with cardiology   Discharge Diagnoses:  Active Hospital Problems   Diagnosis Date Noted   NSTEMI (non-ST elevated myocardial infarction) ([place]) 02/24/2019   Hyperlipidemia LDL goal <70    Malignant neoplasm metastatic to lung (HCC)    Hyponatremia    Thrombocytopenia (HCC)    Diabetes mellitus type 2, noninsulin dependent ([place]) 02/24/2019   Rectal cancer [facility])     Resolved Hospital Problems  No resolved problems to display.    Discharge Condition: stable  Diet recommendation: heart healthy/carb modified  Filed Weights   02/26/19 0426 02/27/19 0553 02/28/19 0533  Weight: 78.5 kg 78.9 kg 79.5 kg    History of present illness: (per admitting MD Dr Sloan Leiter) PCP: Serita Grammes, MD  Patient coming from: [facility].  I have personally briefly reviewed patient's old medical records available.   Chief Complaint: Chest pain.  HPI: Dakota Bennett is a 73 y.o. male with medical history significant of rectal cancer with metastasis to lung currently on chemotherapy, hypertension, type 2 diabetes on metformin, chronic thrombocytopenia who presented to the [facility] yesterday with about 1 month of intermittent chest pain that was gradually worsening.  According to the patient, he had intermittent epigastric pain, occasionally radiating to neck and left arm, yesterday it was more frequent, he will get it intermittently 4-5 times a day.  This things were not bothering him.  Patient even underwent a colonoscopy 4 days ago without any events.  Yesterday, his symptoms were  more frequent so he went to [place]. Denies any fever chills.  Denies any flulike symptoms. ED Course: Chest x-ray with no abnormalities.  EKG nonischemic.  Patient continued to have intermittent chest pain during hospitalization with uptrending troponins, 0.06- 0.2 [id number] 0.68.  Since patient was complaining of intermittent chest pain at [facility], he was started on aspirin and heparin, cardiology was consulted who requested transfer to [facility] for cardiac cath planning. Patient arrived at the cardiology unit, currently denies any complaints.  Patient says he had last episode of chest discomfort in the morning.  Hospital Course:  Principal Problem:   NSTEMI (non-ST elevated myocardial infarction) ([place]) Active Problems:   Rectal cancer ([place])   Diabetes mellitus type 2, noninsulin dependent ([place])   Malignant neoplasm metastatic to lung ([place])   Hyponatremia   Thrombocytopenia ([place])   Hyperlipidemia LDL goal <70   Non-STEMI: -Patient presented with 3 weeks of intermittent pain, he is transferred from [place] to [facility] for cardiology evaluation -CTA coronary showed "Severe diffuse 3 vessel disease with a small dissection in the proximal left main artery." -He is treated with  heparin drip prior to cardiac cath,he is on  asa and high dose statin, home meds toprol-xl continued, he is started on imdur per cardiology - cardiac cath on Monday with "Successful PCI of the RCA. Procedure complicated by acute dissection and vessel closure. This was successfully managed with DES x 3. " per cardiology Dr Martinique "DAPT for one year. I would treat the residual disease in the LAD and PDA medically. The LAD would require atherectomy to treat. Given his overall prognosis I would not  treat this with PCI unless he has refractory angina. " -he is started on asa/plavix/statin, continue home meds metoprolol. He is also started on norvasc and imdur -close follow up with cardiology   HTN:   Home meds HCTZ and spironolactone held since admission and discontinued at discharge He is discharged on home dose metoprolol, he is started on imdur and norvasc, he is instructed to check blood pressure at home , further bp meds adjustment per cardiology/pcp.  Noninsulin dependent dm2, with peripheral neuropathy on neurontin  a1c 6.8, blood glucose stable, am blood glucose 110. Home meds metformin held in the hospital, resumed at discharge    Hyponatremia:  Sodium is 131 on presentation, sodium normalized once stopped HCTZ.    Chronic thrombocytopenia Platelets 90, chronic and stable  Stage IV rectal cancer (Dx 02/2017) withmetsto lung.Currently on palliative5-FU/leucovorin/bevacizumab q2weeky. Had 4500 + 540 Gy XRT).  Last chemo on 4/1 Appear to have good performance status,  Follow up with oncology on outpatient basis  Code Status: full  Family Communication: patient   Disposition Plan: home with cardiology clearance    Consultants:  cardiology  Procedures:  Cardiac cath with stent placement on 4/6  Antibiotics:  none   Discharge Exam: BP (!) 205/99    Pulse 73    Temp 98.3 F (36.8 C) (Oral)    Resp 18    Ht 5\' 10"  (1.778 m)    Wt 79.5 kg    SpO2 100%    BMI 25.15 kg/m   General: NAD, ambulating in room with steady gait Cardiovascular: RRR Respiratory: CTABL  Discharge Instructions You were cared for by a hospitalist during your hospital stay. If you have any questions about your discharge medications or the care you received while you were in the hospital after you are discharged, you can call the unit and asked to speak with the hospitalist on call if the hospitalist that took care of you is not available. Once you are discharged, your primary care physician will handle any further medical issues. Please note that NO REFILLS for any discharge medications will be authorized once you are discharged, as it is imperative that you return to your  primary care physician (or establish a relationship with a primary care physician if you do not have one) for your aftercare needs so that they can reassess your need for medications and monitor your lab values.  Discharge Instructions    Amb Referral to Cardiac Rehabilitation   Complete by:  As directed    Referring to [place] Phase 2   Diagnosis:   NSTEMI Coronary Stents     Diet - low sodium heart healthy   Complete by:  As directed    Discharge instructions   Complete by:  As directed    Please check your blood pressure at home twice a day, please keep blood pressure recording.  Please have your doctor monitor platelet number, call your doctor if you have any signs of bleeding.   Increase activity slowly   Complete by:  As directed      Allergies as of 02/28/2019      Reactions   Lisinopril Hives      Adhesive [tape] Rash   Bandaids   Aspirin Other (See Comments)   Burns stomach    Neosporin [neomycin-bacitracin Zn-polymyx] Rash      Medication List    STOP taking these medications   hydrochlorothiazide 25 MG tablet Commonly known as:  HYDRODIURIL   spironolactone 25 MG tablet Commonly  known as:  ALDACTONE     TAKE these medications   acetaminophen 500 MG tablet Commonly known as:  TYLENOL Take 1,000 mg by mouth every 6 (six) hours as needed for headache (pain).   alum & mag hydroxide-simeth 200-200-20 MG/5ML suspension Commonly known as:  MAALOX/MYLANTA Take 10 mLs by mouth 3 (three) times daily as needed for indigestion or heartburn.   AMBULATORY NON FORMULARY MEDICATION Currently on 3 chemo medication  and have a pump that is connected for 2 days. Pump bag is fluorouracil 3500mg  CIV mixed with sodium chloride 0.9%   amLODipine 5 MG tablet Commonly known as:  NORVASC Take 1 tablet (5 mg total) by mouth daily.   aspirin 81 MG EC tablet Take 1 tablet (81 mg total) by mouth daily. Start taking on:  March 01, 2019   atorvastatin 80 MG tablet Commonly known  as:  LIPITOR Take 1 tablet (80 mg total) by mouth daily at 6 PM.   clopidogrel 75 MG tablet Commonly known as:  PLAVIX Take 1 tablet (75 mg total) by mouth daily with breakfast. Start taking on:  March 01, 2019   diphenoxylate-atropine 2.5-0.025 MG tablet Commonly known as:  LOMOTIL Take 1 tablet by mouth 4 (four) times daily as needed for diarrhea or loose stools.   famotidine 20 MG tablet Commonly known as:  PEPCID Take 1 tablet (20 mg total) by mouth as needed for heartburn or indigestion.   gabapentin 300 MG capsule Commonly known as:  NEURONTIN Take 300 mg by mouth 2 (two) times daily.   isosorbide mononitrate 30 MG 24 hr tablet Commonly known as:  IMDUR Take 1 tablet (30 mg total) by mouth daily. Start taking on:  March 01, 2019   magnesium oxide 400 MG tablet Commonly known as:  MAG-OX Take 1 tablet (400 mg total) by mouth daily.   metFORMIN 500 MG 24 hr tablet Commonly known as:  GLUCOPHAGE-XR Take 500 mg by mouth daily with breakfast.   metoprolol succinate 100 MG 24 hr tablet Commonly known as:  TOPROL-XL Take 100 mg by mouth daily. Take with or immediately following a meal.   pantoprazole 40 MG tablet Commonly known as:  PROTONIX Take 40 mg by mouth daily.   prochlorperazine 10 MG tablet Commonly known as:  COMPAZINE Take 10 mg by mouth 3 (three) times daily as needed for nausea/vomiting.      Allergies  Allergen Reactions   Lisinopril Hives        Adhesive [Tape] Rash    Bandaids   Aspirin Other (See Comments)    Burns stomach    Neosporin [Neomycin-Bacitracin Zn-Polymyx] Rash   Follow-up Information    Almyra Deforest, PA Follow up on 03/10/2019.   Specialties:  Cardiology, Radiology Why:  at 11am for your follow up appt. This will be a tele-health evisit through your smart phone as we are not physically seeing patients in the office at this time. Contact information: [address] [place] [address] [phone]         Richardo Priest, MD Follow up.   Specialty:  Cardiology Why:  [facility] Contact information: [place] [address] [phone]        Serita Grammes, MD Follow up.   Specialty:  Family Medicine Why:  for hospital discharge follow up. Contact information: [place] [address] [phone]        Sanda Klein, MD .   Specialty:  Cardiology Contact information:  [address] [place] [address] [phone]            The results of significant diagnostics from this hospitalization (including imaging, microbiology, ancillary and laboratory) are listed below for reference.    Significant Diagnostic Studies: Ct Coronary Morph W/cta Cor W/score W/ca W/cm &/or Wo/cm  Addendum Date: 02/25/2019   ADDENDUM REPORT: 02/25/2019 12:45 EXAM: OVER-READ INTERPRETATION  CT CHEST The following report is an over-read performed by radiologist Dr. Eben Burow [facility] on 02/25/2019. This over-read does not include interpretation of cardiac or coronary anatomy or pathology. The cardiac/coronary CT interpretation by the cardiologist is attached. COMPARISON:  None. FINDINGS: Multiple pulmonary nodules are seen in visualized portions of both lungs measuring up to 11 mm in the left lower lobe and 10 mm in the right lower lobe. A few of these nodules shows central areas of cavitation. These are suspicious for pulmonary metastases. Patient has history of metastatic colon cancer. IMPRESSION: Multiple bilateral pulmonary nodules, highly suspicious for pulmonary metastases from patient's known colon carcinoma. These results will be called to the ordering clinician or representative by the Radiologist Assistant, and communication documented in the PACS or zVision Dashboard. Electronically Signed   By: Earle Gell M.D.   On: 02/25/2019 12:45   Result Date: 02/25/2019 CLINICAL DATA:  73 year old male with metastatic colorectal cancer,  chest pain and troponin elevation. EXAM: Cardiac/Coronary  CT TECHNIQUE: The patient was scanned on a [employer]. FINDINGS: A 120 kV prospective scan was triggered in the descending thoracic aorta at 111 HU's. Axial non-contrast 3 mm slices were carried out through the heart. The data set was analyzed on a dedicated work station and scored using the [place]. Gantry rotation speed was 250 msecs and collimation was .6 mm. No beta blockade and 0.8 mg of sl NTG was given. The 3D data set was reconstructed in 5% intervals of the 67-82 % of the R-R cycle. Diastolic phases were analyzed on a dedicated work station using MPR, MIP and VRT modes. The patient received 80 cc of contrast. Aorta: Normal size. Moderate diffuse atherosclerotic plaque and calcifications. No dissection. Aortic Valve:  Trileaflet.  No calcifications. Coronary Arteries:  Normal coronary origin.  Right dominance. RCA is a large dominant artery that gives rise to PDA and PLVB. Proximal RCA has a moderate non-calcified plaque with stenosis 50-69% with high risk features (napkin ring sign). This is followed by a severe mixed plaque suspicious for > 70% stenosis and a subtotal occlusion in the mid RCA. Distal RCA has moderate mixed plaque with stenosis 50-69%. PDA and PLA have mild mixed plaque. Left main is a large artery that gives rise to LAD and LCX arteries. Left main has a moderate calcified plaque with stenosis 25-50% and a possible small dissection at the proximal portion with stenosis 25-50%. LAD is a large vessel that gives rise to one large diagonal artery. LAD has diffuse moderate to severe plaque. Proximal to mid LAD has severe mixed plaque suspicious for stenosis > 70%. D1 is a large branch with moderate ostial plaque and stenosis 50-69%. LCX is a non-dominant artery that gives rise to two OM branches. Mid portion of the LCX artery has a severe calcified plaque with stenosis at least 50-69% but possibly > 70%. OM1,2 have  mild diffuse plaque. Other findings: Normal pulmonary vein drainage into the left atrium. Normal let atrial appendage without a thrombus. Normal size of the pulmonary artery. IMPRESSION: 1. Coronary calcium score of 1106.  This was 97 percentile for age and sex matched control. 2. Normal coronary origin with right dominance. 3. Severe diffuse 3 vessel disease with a small dissection in the proximal left main artery. Electronically Signed: By: Ena Dawley On: 02/25/2019 09:20    Microbiology: No results found for this or any previous visit (from the past 240 hour(s)).   Labs: Basic Metabolic Panel: Recent Labs  Lab 02/25/19 0532 02/26/19 0348 02/27/19 0349 02/28/19 0427  NA 133* 131* 135 135  K 4.2 4.1 4.1 4.2  CL 100 97* 103 107  CO2 23 24 24  21*  GLUCOSE 93 114* 117* 110*  BUN 23 19 16 12   CREATININE 1.11 0.99 0.91 0.90  CALCIUM 9.1 8.8* 8.6* 8.5*  MG  --  2.1  --  1.8   Liver Function Tests: No results for input(s): AST, ALT, ALKPHOS, BILITOT, PROT, ALBUMIN in the last 168 hours. No results for input(s): LIPASE, AMYLASE in the last 168 hours. No results for input(s): AMMONIA in the last 168 hours. CBC: Recent Labs  Lab 02/25/19 0532 02/26/19 0348 02/27/19 0349 02/28/19 0427  WBC 4.4 3.9* 3.2* 3.5*  HGB 13.5 13.0 11.8* 11.1*  HCT 40.5 39.1 36.2* 34.4*  MCV 93.1 91.4 91.9 92.2  PLT 93* 94* 80* 73*   Cardiac Enzymes: Recent Labs  Lab 02/24/19 1702 02/28/19 0427  TROPONINI 0.25* 0.13*   BNP: BNP (last 3 results) No results for input(s): BNP in the last 8760 hours.  ProBNP (last 3 results) No results for input(s): PROBNP in the last 8760 hours.  CBG: Recent Labs  Lab 02/27/19 0755 02/27/19 1130 02/27/19 1641 02/27/19 2132 02/28/19 0808  GLUCAP 107* 120* 81 182* 111*       Signed:  Florencia Reasons MD, PhD  [facility] 02/28/2019, 12:24 PM

## 2019-02-28 NOTE — TOC Transition Note (Addendum)
Transition of Care [facility]) - CM/SW Discharge Note   Patient Details  Name: Dakota Bennett MRN: [id number] Date of Birth: 1946/09/11  [facility] CM/SW Contact:  Bethena Roys, RN Phone Number: 02/28/2019, 10:44 AM   Clinical Narrative:  Pt presented for Chest pain- post PCI. Plan for home on Plavix once stable to transition. No home needs identified at this time.    Final next level of care: Home/Self Care Barriers to Discharge: No Barriers Identified   Patient Goals and CMS Choice   CMS Medicare.gov Compare Post Acute Care list provided to:: (No home needs identified. ) Choice offered to / list presented to : NA   Discharge Plan and Services In-house Referral: NA Discharge Planning Services: CM Consult, Medication Assistance Post Acute Care Choice: NA                Readmission Risk Interventions No flowsheet data found.

## 2019-02-28 NOTE — Progress Notes (Signed)
Progress Note  Patient Name: Dakota Bennett Date of Encounter: 02/28/2019  Primary Cardiologist: Sanda Klein, MD   Subjective   No chest Pain  Inpatient Medications    Scheduled Meds: . aspirin EC  81 mg Oral Daily  . atorvastatin  80 mg Oral q1800  . clopidogrel  75 mg Oral Q breakfast  . enoxaparin (LOVENOX) injection  40 mg Subcutaneous Q24H  . insulin aspart  0-5 Units Subcutaneous QHS  . insulin aspart  0-9 Units Subcutaneous TID WC  . isosorbide mononitrate  30 mg Oral Daily  . magnesium oxide  400 mg Oral Daily  . metoprolol succinate  100 mg Oral Daily  . sodium chloride flush  3 mL Intravenous Q12H   Continuous Infusions: . sodium chloride 10 mL/hr at 02/26/19 0406  . sodium chloride     PRN Meds: sodium chloride, sodium chloride, acetaminophen **OR** acetaminophen, diphenoxylate-atropine, famotidine, prochlorperazine, sodium chloride flush   Vital Signs    Vitals:   02/27/19 1301 02/27/19 2018 02/28/19 0533 02/28/19 0535  BP: (!) 167/72 (!) 157/61  (!) 165/57  Pulse:  72  65  Resp:  18    Temp:  97.9 F (36.6 C)  98.3 F (36.8 C)  TempSrc:    Oral  SpO2:  98%  100%  Weight:   79.5 kg   Height:        Intake/Output Summary (Last 24 hours) at 02/28/2019 0744 Last data filed at 02/27/2019 1807 Gross per 24 hour  Intake 240 ml  Output -  Net 240 ml   Last 3 Weights 02/28/2019 02/27/2019 02/26/2019  Weight (lbs) 175 lb 4.8 oz 173 lb 14.4 oz 173 lb 1.6 oz  Weight (kg) 79.516 kg 78.881 kg 78.518 kg      Telemetry    Sinus- Personally Reviewed  ECG    Sinus rhythm at 71 bpm, left bundle branch block- Personally Reviewed  Physical Exam   GEN: No acute distress.  Skin: Ecchymosis in upper arms. Neck: No JVD Cardiac: RRR, no murmurs, rubs, or gallops.  Respiratory: Clear to auscultation bilaterally. GI: Soft, nontender, non-distended  Right radial cath site stable without ecchymoses MS: No edema; No deformity. Neuro:  Nonfocal  Psych: Normal affect    Labs    Chemistry Recent Labs  Lab 02/26/19 0348 02/27/19 0349 02/28/19 0427  NA 131* 135 135  K 4.1 4.1 4.2  CL 97* 103 107  CO2 24 24 21*  GLUCOSE 114* 117* 110*  BUN 19 16 12   CREATININE 0.99 0.91 0.90  CALCIUM 8.8* 8.6* 8.5*  GFRNONAA >60 >60 >60  GFRAA >60 >60 >60  ANIONGAP 10 8 7      Hematology Recent Labs  Lab 02/26/19 0348 02/27/19 0349 02/28/19 0427  WBC 3.9* 3.2* 3.5*  RBC 4.28 3.94* 3.73*  HGB 13.0 11.8* 11.1*  HCT 39.1 36.2* 34.4*  MCV 91.4 91.9 92.2  MCH 30.4 29.9 29.8  MCHC 33.2 32.6 32.3  RDW 16.9* 16.6* 16.5*  PLT 94* 80* 73*    Cardiac Enzymes Recent Labs  Lab 02/24/19 1702 02/28/19 0427  TROPONINI 0.25* 0.13*   No results for input(s): TROPIPOC in the last 168 hours.   BNPNo results for input(s): BNP, PROBNP in the last 168 hours.   DDimer No results for input(s): DDIMER in the last 168 hours.   Radiology    No results found.  Cardiac Studies    Prox LAD lesion is 70% stenosed.  Ost 1st Mrg lesion is 50% stenosed.  RPDA lesion is 80% stenosed.  Prox RCA to Mid RCA lesion is 90% stenosed.  Post intervention, there is a 0% residual stenosis.  A drug-eluting stent was successfully placed using a STENT RESOLUTE ONYX 3.5X38.  A drug-eluting stent was successfully placed using a STENT RESOLUTE ZOXW9.6E45.  A drug-eluting stent was successfully placed using a STENT RESOLUTE ONYX 2.5X18.  The left ventricular systolic function is normal.  LV end diastolic pressure is normal.  The left ventricular ejection fraction is 55-65% by visual estimate.  1. 2 vessel obstructive CAD - 70% mid LAD-heavily calcified - 90% segmental stenosis in the mid RCA - 80% mid PDA 2. Normal LV function 3. Normal LVEDP 4. Successful PCI of the RCA. Procedure complicated by acute dissection and vessel closure. This was successfully managed with DES x 3.     Intervention      Patient Profile     73 y.o. male with past medical  history of metastatic rectal cancer, diabetes mellitus, hypertension, hyperlipidemia admitted with non-ST elevation myocardial infarction. Transferred from [place]. Echo there by report showed normal LV function. Cardiac CTA here shows severe three-vessel coronary artery disease.  Assessment & Plan    1. NSTEMI: Multivessel CAD as noted by catheterization above.  Total RCA occlusion and etiology to the non-ST segment MI. Troponin peaked at 0.25. PCI performed as noted above complicated by dissection requiring ultimate placement of 3 DES stents stenting to the PDA takeoff.  Medical therapy for residual disease. Plan for DAPT with ASA/Plavix for at least one year. Noted LAD would require atherectomy to treat and would not address unless he had refractory angina. Cardiac rehab Phase I ordered. -- will arrange for tele-health outpatient follow up appt  2. Hypertension: BP elevated earlier today, will monitor and if still elevated tomorrow additional medication adjustment will be necessary. Add amlodipine 5 mg  3. Thrombocytopenia: Platelet count 73,000 today; on ASA/Plavix will need to monitor platelet count  4.  Hyperlipidemia: on high dose statin. LDL 97 with target LDL less than 70       5.  H/O metastatic rectal cancer         For questions or updates, please contact [place] Please consult [email or web address] for contact info under   Signed, Reino Bellis, NP  02/28/2019, 7:44 AM     Patient seen and examined. Agree with assessment and plan.  No recurrent chest pain.  I again reviewed the catheterization findings.  PCI of his long 90% stenosis to his RCA was complicated by dissection requiring placement of 3 DES stents extending to the PDA takeoff.  We will plan medical therapy for residual disease of his PDA, circumflex and LAD with amlodipine 5 mg, Imdur 30 mg, and continue metoprolol succinate 100 mg daily.  Will need close follow-up of platelet count since he is on  aspirin and Plavix and with recently placed 3 stents need to continue Plavix.  Patient states his platelet counts typically run in the 60-200 range and undergoes chemotherapy in [place].   Troy Sine, MD, [facility] 02/28/2019 11:23 AM  [facility] will sign off.   Medication Recommendations: Imdur 30 mg, metoprolol succinate 100 mg, amlodipine 5 mg, aspirin/Plavix, atorvastatin 80 mg Other recommendations (labs, testing, etc): Follow-up lipid panel CBC and chemistry as outpatient Follow up as an outpatient:  If patient prefers cardiology follow-up in [place], would refer to see [facility] to see either Dr. Bettina Gavia, Gay, or Agustin Cree.  Otherwise, follow-up with Dr. Sallyanne Kuster in  [employer] [place]

## 2019-03-01 ENCOUNTER — Telehealth: Payer: Self-pay

## 2019-03-01 DIAGNOSIS — I240 Acute coronary thrombosis not resulting in myocardial infarction: Secondary | ICD-10-CM | POA: Diagnosis not present

## 2019-03-01 DIAGNOSIS — Z955 Presence of coronary angioplasty implant and graft: Secondary | ICD-10-CM | POA: Diagnosis not present

## 2019-03-01 DIAGNOSIS — E1165 Type 2 diabetes mellitus with hyperglycemia: Secondary | ICD-10-CM | POA: Diagnosis not present

## 2019-03-01 DIAGNOSIS — I2111 ST elevation (STEMI) myocardial infarction involving right coronary artery: Secondary | ICD-10-CM | POA: Diagnosis not present

## 2019-03-01 NOTE — Telephone Encounter (Signed)
-----   Message from Janine Limbo sent at 03/01/2019  8:50 AM EDT ----- Regarding: appt for patient Dakota Bennett was discharged from [facility] yesterday (02/28/2019).  He says he had a heart attack and some stints were put in. He was told to follow up with Dr Bettina Gavia asap. Please advise how this should be schedule - virtual or in office appointment

## 2019-03-01 NOTE — Telephone Encounter (Signed)
Spoke with patient regarding virtual visit with Dr Bettina Gavia after being discharged from [facility] status post [place] with stents.  Patient gave verbal agreement for virtual visit with [facility] on 03-02-2019 at 2:0pm with Dr Bettina Gavia.  Copy of consent also sent by MyChart for patient to review.     Cardiac Questionnaire:    Since your last visit or hospitalization:    1. Have you been having new or worsening chest pain? No   2. Have you been having new or worsening shortness of breath? No 3. Have you been having new or worsening leg swelling, wt gain, or increase in abdominal girth (pants fitting more tightly)? No   4. Have you had any passing out spells? No       _____________   ERDEY-81 Pre-Screening Questions:   Do you currently have a fever? No  Have you recently travelled on a cruise, internationally, or to [place], [place], [place], [place], [place], or [place], [place] [employer]) ? No  Have you been in contact with someone that is currently pending confirmation of Covid19 testing or has been confirmed to have the Startup virus? No   Are you currently experiencing fatigue or cough? No           YOUR CARDIOLOGY TEAM HAS ARRANGED FOR AN E-VISIT FOR YOUR APPOINTMENT - PLEASE REVIEW IMPORTANT INFORMATION BELOW SEVERAL DAYS PRIOR TO YOUR APPOINTMENT  Due to the recent COVID-19 pandemic, we are transitioning in-person office visits to tele-medicine visits in an effort to decrease unnecessary exposure to our patients and staff. Medicare and most insurances are covering these visits without a copay needed. We also encourage you to sign up for MyChart if you have not already done so. You will need a smartphone if possible. For patients that do not have this, we can still complete the visit using a regular telephone but do prefer a smartphone to enable video when possible. You may have a close family member that lives with you that can help. If possible, we also ask that you have a blood pressure cuff and scale at  home to measure your blood pressure, heart rate and weight prior to your scheduled appointment. Patients with clinical needs that need an in-person evaluation and testing will still be able to come to the office if absolutely necessary. If you have any questions, feel free to call our office.    IF YOU HAVE A SMARTPHONE, PLEASE DOWNLOAD THE WEBEX APP TO YOUR SMARTPHONE  - If Apple, go to [employer] and type in WebEx in the search bar. [place] [employer], the blue/green circle. The app is free but as with any other app download, your phone may require you to verify saved payment information or Apple password. You do NOT have to create a WebEx account.  - If Android, go to [employer] and type in [employer] in the search bar. [place] [employer], the blue/green circle. The app is free but as with any other app download, your phone may require you to verify saved payment information or Android password. You do NOT have to create a WebEx account.  It is very helpful to have this downloaded before your visit.    2-3 DAYS BEFORE YOUR APPOINTMENT  You will receive a telephone call from one of our [facility] team members - your caller ID may say "Unknown caller." If this is a video visit, we will confirm that you have been able to download the WebEx app. We will remind you  check your blood pressure, heart rate and weight prior to your scheduled appointment. If you have an Apple Watch or Kardia, please upload any pertinent ECG strips the day before or morning of your appointment to [place]. Our staff will also make sure you have reviewed the consent and agree to move forward with your scheduled tele-health visit.     THE DAY OF YOUR APPOINTMENT  Approximately 15 minutes prior to your scheduled appointment, you will receive a telephone call from one of [facility] team - your caller ID may say "Unknown caller."  Our staff will confirm medications, vital signs for the day and any  symptoms you may be experiencing. Please have this information available prior to the time of visit start. It may also be helpful for you to have a pad of paper and pen handy for any instructions given during your visit. They will also walk you through joining the WebEx smartphone meeting if this is a video visit.    CONSENT FOR TELE-HEALTH VISIT - PLEASE REVIEW  I hereby voluntarily request, consent and authorize [facility] and its employed or contracted physicians, physician assistants, nurse practitioners or other licensed health care professionals (the Practitioner), to provide me with telemedicine health care services (the Services") as deemed necessary by the treating Practitioner. I acknowledge and consent to receive the Services by the Practitioner via telemedicine. I understand that the telemedicine visit will involve communicating with the Practitioner through live audiovisual communication technology and the disclosure of certain medical information by electronic transmission. I acknowledge that I have been given the opportunity to request an in-person assessment or other available alternative prior to the telemedicine visit and am voluntarily participating in the telemedicine visit.  I understand that I have the right to withhold or withdraw my consent to the use of telemedicine in the course of my care at any time, without affecting my right to future care or treatment, and that the Practitioner or I may terminate the telemedicine visit at any time. I understand that I have the right to inspect all information obtained and/or recorded in the course of the telemedicine visit and may receive copies of available information for a reasonable fee.  I understand that some of the potential risks of receiving the Services via telemedicine include:   Delay or interruption in medical evaluation due to technological equipment failure or disruption;  Information transmitted may not be sufficient  (e.g. poor resolution of images) to allow for appropriate medical decision making by the Practitioner; and/or   In rare instances, security protocols could fail, causing a breach of personal health information.  Furthermore, I acknowledge that it is my responsibility to provide information about my medical history, conditions and care that is complete and accurate to the best of my ability. I acknowledge that Practitioner's advice, recommendations, and/or decision may be based on factors not within their control, such as incomplete or inaccurate data provided by me or distortions of diagnostic images or specimens that may result from electronic transmissions. I understand that the practice of medicine is not an exact science and that Practitioner makes no warranties or guarantees regarding treatment outcomes. I acknowledge that I will receive a copy of this consent concurrently upon execution via email to the email address I last provided but may also request a printed copy by calling the office of [facility].    I understand that my insurance will be billed for this visit.   I have read or had this consent read to  me.  I understand the contents of this consent, which adequately explains the benefits and risks of the Services being provided via telemedicine.   I have been provided ample opportunity to ask questions regarding this consent and the Services and have had my questions answered to my satisfaction.  I give my informed consent for the services to be provided through the use of telemedicine in my medical care  By participating in this telemedicine visit I agree to the above.

## 2019-03-02 ENCOUNTER — Encounter: Payer: Self-pay | Admitting: *Deleted

## 2019-03-02 ENCOUNTER — Telehealth (INDEPENDENT_AMBULATORY_CARE_PROVIDER_SITE_OTHER): Payer: Medicare Other | Admitting: Cardiology

## 2019-03-02 ENCOUNTER — Encounter: Payer: Self-pay | Admitting: Cardiology

## 2019-03-02 VITALS — BP 153/67 | HR 60 | Ht 70.5 in | Wt 180.8 lb

## 2019-03-02 DIAGNOSIS — I214 Non-ST elevation (NSTEMI) myocardial infarction: Secondary | ICD-10-CM

## 2019-03-02 DIAGNOSIS — I2510 Atherosclerotic heart disease of native coronary artery without angina pectoris: Secondary | ICD-10-CM | POA: Insufficient documentation

## 2019-03-02 DIAGNOSIS — C7800 Secondary malignant neoplasm of unspecified lung: Secondary | ICD-10-CM

## 2019-03-02 DIAGNOSIS — E785 Hyperlipidemia, unspecified: Secondary | ICD-10-CM

## 2019-03-02 DIAGNOSIS — D696 Thrombocytopenia, unspecified: Secondary | ICD-10-CM

## 2019-03-02 HISTORY — DX: Atherosclerotic heart disease of native coronary artery without angina pectoris: I25.10

## 2019-03-02 MED ORDER — NITROGLYCERIN 0.4 MG SL SUBL: 0.4000 mg | SUBLINGUAL_TABLET | SUBLINGUAL | 3 refills | Status: DC | PRN

## 2019-03-02 NOTE — Progress Notes (Signed)
Virtual Visit via Video Note   This visit type was conducted due to national recommendations for restrictions regarding the COVID-19 Pandemic (e.g. social distancing) in an effort to limit this patient's exposure and mitigate transmission in our community.  Due to his co-morbid illnesses, this patient is at least at moderate risk for complications without adequate follow up.  This format is felt to be most appropriate for this patient at this time.  All issues noted in this document were discussed and addressed.  A limited physical exam was performed with this format.  Please refer to the patient's chart for his consent to telehealth for [facility].   Evaluation Performed:  Follow-up visit  Date:  03/02/2019   ID:  Dakota Bennett, DOB 05-Oct-1946, MRN [id number]  Patient Location: Home  Provider Location: Home  PCP:  Serita Grammes, MD  Cardiologist:  Sanda Klein, MD Dr Bettina Gavia as outpatient care Electrophysiologist:  None   Chief Complaint:  I had a recent MI and PCI  History of Present Illness:    Dakota Bennett is a 73 y.o. male who presents via audio/video conferencing for a telehealth visit today.    Quantel Mcinturff is a 73 y.o. male with a history of stage IIIa colorectal cancer with metastasis to the lungs, hypertension, hyperlipidemia, and type 2 diabetes mellitus, GERD, chronic hyponatremia, and chronic thrombocytopenia, who presented to the [facility] ED for evaluation of chest pain and was found to have a NSTEMI. He was transferred to [facility] for further evaluation.he underwent left heart cath and PCI 02/27/2019 troponin peaked 0.25 he had PCI and stent to the right coronary artery complicated with an acute dissection requiring 3 drug-eluting stents for control he had residual proximal LAD disease 70% first marginal 50% of recommendation to treat medically unless he has refractory angina as he would require atherectomy for the proximal LAD lesion.  Also of note is that his  ventricular function was normal ejection fraction 55 to 65% and he has thrombocytopenia with platelet count in the range of 73,000 and history of metastatic renal cancer   Conclusion 02/27/19    Prox LAD lesion is 70% stenosed.  Ost 1st Mrg lesion is 50% stenosed.  RPDA lesion is 80% stenosed.  Prox RCA to Mid RCA lesion is 90% stenosed.  Post intervention, there is a 0% residual stenosis.  A drug-eluting stent was successfully placed using a STENT RESOLUTE ONYX 3.5X38.  A drug-eluting stent was successfully placed using a STENT RESOLUTE IWPY0.9X83.  A drug-eluting stent was successfully placed using a STENT RESOLUTE ONYX 2.5X18.  The left ventricular systolic function is normal.  LV end diastolic pressure is normal.  The left ventricular ejection fraction is 55-65% by visual estimate.   1. 2 vessel obstructive CAD    - 70% mid LAD-heavily calcified    - 90% segmental stenosis in the mid RCA    - 80% mid PDA 2. Normal LV function 3. Normal LVEDP 4. Successful PCI of the RCA. Procedure complicated by acute dissection and vessel closure. This was successfully managed with DES x 3.    Plan: DAPT for one year. I would treat the residual disease in the LAD and PDA medically. The LAD would require atherectomy to treat. Given his overall prognosis I would not treat this with PCI unless he has refractory angina.     The patient does not have symptoms concerning for COVID-19 infection (fever, chills, cough, or new shortness of breath).   With his thrombocytopenia he  has had no bleeding but he did bruise his arm while doing activities in the house.  Overall he feels well pleased with the quality of his life he is walking 5 to 10 minutes a day and has had no angina shortness of breath edema palpitations syncope or TIA.  He has a little bruising at the wrist but no pain.  He is due to receive chemotherapy Monday I told him I do not see a problem from a cardiology perspective.  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  His home blood pressures have consistently been less than 1 16-0 40 systolic except for today.  I reviewed medications he had urticaria with lisinopril. Past Medical History:  Diagnosis Date   Allergic rhinitis    Cancer ([place])    rectal cancer and found spots on his lungs also and is on chemo currently    Diabetes mellitus without complication (HCC)    type 2   GERD (gastroesophageal reflux disease)    Headache    hx of migraines yrs ago   Hyperlipidemia    Hypertension    MVP (mitral valve prolapse)    dx 42 yrs no cardiologist   Past Surgical History:  Procedure Laterality Date   COLONOSCOPY  03/03/2017   Proximal rectal mass (biopsied). Transverse colonic polyps status post polypectomy. Mild sigmoid diverticulosis   CORONARY STENT INTERVENTION N/A 02/27/2019   Procedure: CORONARY STENT INTERVENTION;  Surgeon: Martinique, Peter M, MD;  Location: [facility];  Service: Cardiovascular;  Laterality: N/A;   EUS N/A 03/18/2017   Procedure: LOWER ENDOSCOPIC ULTRASOUND (EUS);  Surgeon: Milus Banister, MD;  Location: [facility];  Service: Endoscopy;  Laterality: N/A;   extensive dental work     FLEXIBLE SIGMOIDOSCOPY N/A 02/21/2019   Procedure: FLEXIBLE SIGMOIDOSCOPY;  Surgeon: Jackquline Denmark, MD;  Location: [facility];  Service: Endoscopy;  Laterality: N/A;   ingrown toenail removed  age 28 or 13   LEFT HEART CATH AND CORONARY ANGIOGRAPHY N/A 02/27/2019   Procedure: LEFT HEART CATH AND CORONARY ANGIOGRAPHY;  Surgeon: Martinique, Peter M, MD;  Location: [facility];  Service: Cardiovascular;  Laterality: N/A;     Current Meds  Medication Sig   acetaminophen (TYLENOL) 500 MG tablet Take 1,000 mg by mouth every 6 (six) hours as needed for headache (pain).   alum & mag hydroxide-simeth (MAALOX/MYLANTA) 200-200-20 MG/5ML suspension Take  10 mLs by mouth 3 (three) times daily as needed for indigestion or heartburn.   AMBULATORY NON FORMULARY MEDICATION Currently on 3 chemo medication  and have a pump that is connected for 2 days. Pump bag is fluorouracil 3500mg  CIV mixed with sodium chloride 0.9%   amLODipine (NORVASC) 5 MG tablet Take 1 tablet (5 mg total) by mouth daily.   aspirin EC 81 MG EC tablet Take 1 tablet (81 mg total) by mouth daily.   atorvastatin (LIPITOR) 80 MG tablet Take 1 tablet (80 mg total) by mouth daily at 6 PM.   clopidogrel (PLAVIX) 75 MG tablet Take 1 tablet (75 mg total) by mouth daily with breakfast.   diphenoxylate-atropine (LOMOTIL) 2.5-0.025 MG tablet Take 1 tablet by mouth 4 (four) times daily as needed for diarrhea or loose stools.   famotidine (PEPCID) 20 MG tablet Take 1 tablet (20 mg total) by mouth as needed for heartburn or indigestion.   gabapentin (NEURONTIN) 300 MG capsule  Take 300 mg by mouth 2 (two) times daily.   isosorbide mononitrate (IMDUR) 30 MG 24 hr tablet Take 1 tablet (30 mg total) by mouth daily.   magnesium oxide (MAG-OX) 400 MG tablet Take 1 tablet (400 mg total) by mouth daily.   metFORMIN (GLUCOPHAGE-XR) 500 MG 24 hr tablet Take 500 mg by mouth daily with breakfast.    metoprolol succinate (TOPROL-XL) 100 MG 24 hr tablet Take 100 mg by mouth daily. Take with or immediately following a meal.   pantoprazole (PROTONIX) 40 MG tablet Take 40 mg by mouth daily.   prochlorperazine (COMPAZINE) 10 MG tablet Take 10 mg by mouth 3 (three) times daily as needed for nausea/vomiting.     Allergies:   Lisinopril; Adhesive [tape]; Aspirin; and Neosporin [neomycin-bacitracin zn-polymyx]   Social History   Tobacco Use   Smoking status: Former Smoker    Packs/day: 2.00    Years: 20.00    Pack years: 40.00    Types: Cigarettes   Smokeless tobacco: Former Systems developer    Quit date: 11/23/1998   Tobacco comment: quit 2000  Substance Use Topics   Alcohol use: No   Drug use: No       Family Hx: The patient's family history includes Heart attack in his father. There is no history of Colon cancer or Esophageal cancer.  ROS:   Please see the history of present illness.     All other systems reviewed and are negative.   Prior CV studies:   The following studies were reviewed today:  Labs/Other Tests and Data Reviewed:      Recent Labs: 02/28/2019: BUN 12; Creatinine, Ser 0.90; Hemoglobin 11.1; Magnesium 1.8; Platelets 73; Potassium 4.2; Sodium 135   Recent Lipid Panel Lab Results  Component Value Date/Time   CHOL 150 02/26/2019 03:48 AM   TRIG 88 02/26/2019 03:48 AM   HDL 35 (L) 02/26/2019 03:48 AM   CHOLHDL 4.3 02/26/2019 03:48 AM   LDLCALC 97 02/26/2019 03:48 AM    Wt Readings from Last 3 Encounters:  03/02/19 180 lb 12.8 oz (82 kg)  02/28/19 175 lb 4.8 oz (79.5 kg)  02/21/19 180 lb 3 oz (81.7 kg)     Objective:    Vital Signs:  BP (!) 153/67 (BP Location: Left Arm, Patient Position: Sitting)    Pulse 60    Ht 5' 10.5" (1.791 m)    Wt 180 lb 12.8 oz (82 kg)    BMI 25.58 kg/m    Constitutional, well-nourished well-developed in no acute distress Vital signs reviewed Eyes, conjunctiva and sclera are normal without pallor or icterus extraocular motions intact and normal there is no lid lag Respiratory, normal effort and excursion no audible wheezing without a stethoscope Cardiovascular, no neck vein distention or peripheral edema Skin, no rash skin lesion or ulceration of the extremities he has a lot of ecchymosis at the radial artery site Neurologic, cranial nerves II to XII are grossly intact and the patient moves all 4 extremities Neuro/Psychiatric, judgment and thought processes are intact and coherent, alert and oriented x3, mood and affect appear normal.  ASSESSMENT & PLAN:    1. Coronary artery disease stable he had a relatively uneventful non-ST elevation MI he had no electrical or hemodynamic instability and since his PCI stent and  multiple DES for dissection he has had no angina.  I have asked him to gradually increase activity and walking 20 to 30 minutes a day unfortunately cardiac rehabilitation was closed.  Continue medical therapy including  dual antiplatelet for at least 3 months.  He will continue treatment including beta-blocker calcium channel blocker oral nitrate and statin. 2. Subsequent care non-ST elevation MI see above records reviewed discussed cardiac catheterization and intervention and treatment with the patient 3. Thrombocytopenia chronic he has labs every 2 weeks at the cancer center continue dual antiplatelet therapy a minimum of 3 months 4. Continue his palliative chemotherapy 5. Hyperlipidemia stable continue statin  COVID-19 Education: The signs and symptoms of COVID-19 were discussed with the patient and how to seek care for testing (follow up with PCP or arrange E-visit).  The importance of social distancing was discussed today.  Time:   Today, I have spent 35 minutes with the patient with telehealth technology discussing the above problems.     Medication Adjustments/Labs and Tests Ordered: Current medicines are reviewed at length with the patient today.  Concerns regarding medicines are outlined above.  Tests Ordered: No orders of the defined types were placed in this encounter.  Medication Changes: No orders of the defined types were placed in this encounter.   Disposition:  Follow up in 4 week(s)  Signed, Shirlee More, MD  03/02/2019 2:11 PM    [facility]

## 2019-03-02 NOTE — Patient Instructions (Addendum)
Medication Instructions:  Your physician has recommended you make the following change in your medication:   START nitroglycerin as needed for chest pain: When having chest pain, stop what you are doing and sit down. Take 1 nitro, wait 5 minutes. Still having chest pain, take 1 nitro, wait 5 minutes. Still having chest pain, take 1 nitro, dial 911. Total of 3 nitro in 15 minutes.  If you need a refill on your cardiac medications before your next appointment, please call your pharmacy.   Lab work: None  If you have labs (blood work) drawn today and your tests are completely normal, you will receive your results only by: Marland Kitchen MyChart Message (if you have MyChart) OR . A paper copy in the mail If you have any lab test that is abnormal or we need to change your treatment, we will call you to review the results.  Testing/Procedures: None  Follow-Up: At [facility], you and your health needs are our priority.  As part of our continuing mission to provide you with exceptional heart care, we have created designated Provider Care Teams.  These Care Teams include your primary Cardiologist (physician) and Advanced Practice Providers (APPs -  Physician Assistants and Nurse Practitioners) who all work together to provide you with the care you need, when you need it. . You will need a follow up appointment in 4 weeks as a virtual visit: Friday, 03/31/2019, at 9:30 am.   Any Other Special Instructions Will Be Listed Below (If Applicable).  **A return to work note has been mailed to you.    Nitroglycerin sublingual tablets What is this medicine? NITROGLYCERIN (nye troe GLI ser in) is a type of vasodilator. It relaxes blood vessels, increasing the blood and oxygen supply to your heart. This medicine is used to relieve chest pain caused by angina. It is also used to prevent chest pain before activities like climbing stairs, going outdoors in cold weather, or sexual activity. This medicine may be used for  other purposes; ask your health care provider or pharmacist if you have questions. COMMON BRAND NAME(S): Nitroquick, Nitrostat, Nitrotab What should I tell my health care provider before I take this medicine? They need to know if you have any of these conditions: -anemia -head injury, recent stroke, or bleeding in the brain -liver disease -previous heart attack -an unusual or allergic reaction to nitroglycerin, other medicines, foods, dyes, or preservatives -pregnant or trying to get pregnant -breast-feeding How should I use this medicine? Take this medicine by mouth as needed. At the first sign of an angina attack (chest pain or tightness) place one tablet under your tongue. You can also take this medicine 5 to 10 minutes before an event likely to produce chest pain. Follow the directions on the prescription label. Let the tablet dissolve under the tongue. Do not swallow whole. Replace the dose if you accidentally swallow it. It will help if your mouth is not dry. Saliva around the tablet will help it to dissolve more quickly. Do not eat or drink, smoke or chew tobacco while a tablet is dissolving. If you are not better within 5 minutes after taking ONE dose of nitroglycerin, call 9-1-1 immediately to seek emergency medical care. Do not take more than 3 nitroglycerin tablets over 15 minutes. If you take this medicine often to relieve symptoms of angina, your doctor or health care professional may provide you with different instructions to manage your symptoms. If symptoms do not go away after following these instructions, it  is important to call 9-1-1 immediately. Do not take more than 3 nitroglycerin tablets over 15 minutes. Talk to your pediatrician regarding the use of this medicine in children. Special care may be needed. Overdosage: If you think you have taken too much of this medicine contact a poison control center or emergency room at once. NOTE: This medicine is only for you. Do not share  this medicine with others. What if I miss a dose? This does not apply. This medicine is only used as needed. What may interact with this medicine? Do not take this medicine with any of the following medications: -certain migraine medicines like ergotamine and dihydroergotamine (DHE) -medicines used to treat erectile dysfunction like sildenafil, tadalafil, and vardenafil -riociguat This medicine may also interact with the following medications: -alteplase -aspirin -heparin -medicines for high blood pressure -medicines for mental depression -other medicines used to treat angina -phenothiazines like chlorpromazine, mesoridazine, prochlorperazine, thioridazine This list may not describe all possible interactions. Give your health care provider a list of all the medicines, herbs, non-prescription drugs, or dietary supplements you use. Also tell them if you smoke, drink alcohol, or use illegal drugs. Some items may interact with your medicine. What should I watch for while using this medicine? Tell your doctor or health care professional if you feel your medicine is no longer working. Keep this medicine with you at all times. Sit or lie down when you take your medicine to prevent falling if you feel dizzy or faint after using it. Try to remain calm. This will help you to feel better faster. If you feel dizzy, take several deep breaths and lie down with your feet propped up, or bend forward with your head resting between your knees. You may get drowsy or dizzy. Do not drive, use machinery, or do anything that needs mental alertness until you know how this drug affects you. Do not stand or sit up quickly, especially if you are an older patient. This reduces the risk of dizzy or fainting spells. Alcohol can make you more drowsy and dizzy. Avoid alcoholic drinks. Do not treat yourself for coughs, colds, or pain while you are taking this medicine without asking your doctor or health care professional for  advice. Some ingredients may increase your blood pressure. What side effects may I notice from receiving this medicine? Side effects that you should report to your doctor or health care professional as soon as possible: -blurred vision -dry mouth -skin rash -sweating -the feeling of extreme pressure in the head -unusually weak or tired Side effects that usually do not require medical attention (report to your doctor or health care professional if they continue or are bothersome): -flushing of the face or neck -headache -irregular heartbeat, palpitations -nausea, vomiting This list may not describe all possible side effects. Call your doctor for medical advice about side effects. You may report side effects to FDA at 1-800-FDA-1088. Where should I keep my medicine? Keep out of the reach of children. Store at room temperature between 20 and 25 degrees C (68 and 77 degrees F). Store in [employer] of [employer]. Protect from light and moisture. Keep tightly closed. Throw away any unused medicine after the expiration date. NOTE: This sheet is a summary. It may not cover all possible information. If you have questions about this medicine, talk to your doctor, pharmacist, or health care provider.  2019 Elsevier/Gold Standard (2013-09-07 17:57:36)

## 2019-03-06 DIAGNOSIS — C218 Malignant neoplasm of overlapping sites of rectum, anus and anal canal: Secondary | ICD-10-CM | POA: Diagnosis not present

## 2019-03-08 DIAGNOSIS — B029 Zoster without complications: Secondary | ICD-10-CM | POA: Diagnosis not present

## 2019-03-08 DIAGNOSIS — Z6825 Body mass index (BMI) 25.0-25.9, adult: Secondary | ICD-10-CM | POA: Diagnosis not present

## 2019-03-10 ENCOUNTER — Telehealth: Payer: Medicare Other | Admitting: Physician Assistant

## 2019-03-16 ENCOUNTER — Telehealth: Payer: Self-pay

## 2019-03-16 NOTE — Telephone Encounter (Signed)
Is there a specific time frame looking for 3 pounds weight loss to occur before increasing lasix to 20 mg BID?

## 2019-03-16 NOTE — Telephone Encounter (Signed)
Patient is calling to report edema to lower extremities. He having some shortness of breath and has gained 14# over the last 2-3 weeks. He denies chest pain during this time and has not noted any irregular heart beats. Blood pressure today is146/65 with a HR of 70. Patient did have 2 episodes of chest pain about 2 weeks ago which resolved both times after taking 1 sublingual nitroglycerin. He has recently had a flare up of shingles to his chest which happened last week and recently completed a valcyclovir prescription.    Medication list reviewed with patient, he's taking all prescriptions as ordered.   pls advise, tx

## 2019-03-16 NOTE — Telephone Encounter (Signed)
Lets have him begin taking furosemide 20 mg daily.  It is weight weighing every morning does not decline 3 pounds increase to 20 twice a day.  He takes gabapentin and unfortunately I can cause marked retention of sodium.  Please ask him if he is having lab work checked at [facility] he is seen by the oncologist at that is the case I would like him to have a proBNP done with his next labs at [facility] and if they are not check in his lab work I like him to come into our office to have renal function BMP and a proBNP level performed.  Please put him in virtual follow-up for me next week

## 2019-03-17 ENCOUNTER — Telehealth: Payer: Self-pay

## 2019-03-17 MED ORDER — FUROSEMIDE 20 MG PO TABS: 20.0000 mg | ORAL_TABLET | Freq: Every day | ORAL | 1 refills | Status: DC

## 2019-03-17 NOTE — Telephone Encounter (Signed)
Phoned patient, explained that Dr. Bettina Gavia would like him to start taking lasix 20 mg daily and to continue to weigh himself daily. If he has not lost 3 or more pounds after 2 days, to increase lasix  to 2 tablets daily.   Also informed that Dr. Bettina Gavia would like labs drawn which can be added on to next lab draw from his oncologist. Patient's oncologist is Dr. Hinton Rao at [facility] in [place].  I will call Dr. Hinton Rao s office to add those on and If that option isn't possible, he can come in to our office to have the labs drawn. Our office will follow-up with him to arrange lab draw at our office if that's needed.   .Dr.Munley would like to see him via virtual visit next week, however it appears that his schedule is full. Patient already has a virtual follow-up visit with Dr. Bettina Gavia 5/7. Will check with Dr. Bettina Gavia to see if that's soon enough. Lasix prescription sent to [facility] on [address] in [place] per pt preference.

## 2019-03-17 NOTE — Telephone Encounter (Signed)
Pro BNP added to oncology labs to be drawn 03/31/19 at [facility]

## 2019-03-17 NOTE — Telephone Encounter (Signed)
See previous note

## 2019-03-17 NOTE — Telephone Encounter (Signed)
Yes for [facility]

## 2019-03-17 NOTE — Telephone Encounter (Signed)
Left voice message this morning and again now for RN at [facility] to see if she will take a verbal order to add Pro BNP to oncology labs patient having drawn aon 03/31/19 at their clini and requesting return call.

## 2019-03-17 NOTE — Telephone Encounter (Signed)
Phoned patient and left voice message, OK per DPR stating that Dr. Bettina Gavia is fine with waiting to see him until his already scheduled appointment on 5/6. Also informed that Dr. Joya Gaskins requested labwork can/will be drawn with next oncology labs on 03/31/19.

## 2019-03-17 NOTE — Telephone Encounter (Signed)
2 days

## 2019-03-22 DIAGNOSIS — C20 Malignant neoplasm of rectum: Secondary | ICD-10-CM | POA: Diagnosis not present

## 2019-03-22 DIAGNOSIS — B029 Zoster without complications: Secondary | ICD-10-CM | POA: Diagnosis not present

## 2019-03-22 DIAGNOSIS — Z6825 Body mass index (BMI) 25.0-25.9, adult: Secondary | ICD-10-CM | POA: Diagnosis not present

## 2019-03-22 DIAGNOSIS — C7800 Secondary malignant neoplasm of unspecified lung: Secondary | ICD-10-CM | POA: Diagnosis not present

## 2019-03-28 NOTE — Progress Notes (Signed)
Virtual Visit via Video Note   This visit type was conducted due to national recommendations for restrictions regarding the COVID-19 Pandemic (e.g. social distancing) in an effort to limit this patient's exposure and mitigate transmission in our community.  Due to his co-morbid illnesses, this patient is at least at moderate risk for complications without adequate follow up.  This format is felt to be most appropriate for this patient at this time.  All issues noted in this document were discussed and addressed.  A limited physical exam was performed with this format.  Please refer to the patient's chart for his consent to telehealth for [facility].   Date:  03/28/2019   ID:  Dakota Bennett, DOB 03-12-1946, MRN [id number]  Patient Location: Home Provider Location: Office  PCP:  Serita Grammes, MD  Cardiologist:  Dr Bettina Gavia Electrophysiologist:  None   Evaluation Performed:  Follow-Up Visit  Chief Complaint:  CAD  History of Present Illness:    Dakota Bennett is a 73 y.o. 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 He was transferred to [facility] for further evaluation.he underwent left heart cath and PCI 02/27/2019 troponin peaked 0.25 he had PCI and stent to the right coronary artery complicated with an acute dissection requiring 3 drug-eluting stents for control he had residual proximal LAD disease 70% first marginal 50% of recommendation to treat medically unless he has refractory angina as he would require atherectomy for the proximal LAD lesion.  Also of note is that his ventricular function was normal ejection fraction 55 to 65% and he has thrombocytopenia with platelet count in the range of 73,000 and history of metastatic renal  cancer. He was last seen 03/02/19    The patient does not have symptoms concerning for COVID-19 infection (fever, chills, cough, or new shortness of breath).   Overall he is doing well he is followed in the cancer program continues to receive treatments weight is down 10 pounds but still has residual edema.  I will have him take an extra tablet of furosemide Monday Wednesday and Friday continue to follow weight blood pressure at home and reassess in the office face-to-face 2 weeks.  He will have lab work drawn on Friday at the cancer center copy to me for his chronic thrombocytopenia and to look at his renal function and potassium.  He tolerates dual antiplatelet therapy without bruising or bleeding and is on his cardiac medications including high intensity statin.  Past Medical History:  Diagnosis Date   Allergic rhinitis    Cancer ([place])    rectal cancer and found spots on his lungs also and is on chemo currently    Diabetes mellitus without complication (HCC)    type 2   GERD (gastroesophageal reflux disease)    Headache    hx of migraines yrs ago   Hyperlipidemia    Hypertension    MVP (mitral valve prolapse)    dx 42 yrs no cardiologist   Past Surgical History:  Procedure Laterality Date   COLONOSCOPY  03/03/2017   Proximal rectal mass (biopsied). Transverse colonic polyps status post polypectomy. Mild sigmoid diverticulosis   CORONARY STENT INTERVENTION N/A 02/27/2019   Procedure: CORONARY STENT INTERVENTION;  Surgeon: Martinique, Peter M, MD;  Location: [facility];  Service:  Cardiovascular;  Laterality: N/A;   EUS N/A 03/18/2017   Procedure: LOWER ENDOSCOPIC ULTRASOUND (EUS);  Surgeon: Milus Banister, MD;  Location: [facility];  Service: Endoscopy;  Laterality: N/A;   extensive dental work     FLEXIBLE SIGMOIDOSCOPY N/A 02/21/2019   Procedure: FLEXIBLE SIGMOIDOSCOPY;  Surgeon: Jackquline Denmark, MD;  Location: [facility];  Service: Endoscopy;  Laterality: N/A;    ingrown toenail removed  age 8 or 74   LEFT HEART CATH AND CORONARY ANGIOGRAPHY N/A 02/27/2019   Procedure: LEFT HEART CATH AND CORONARY ANGIOGRAPHY;  Surgeon: Martinique, Peter M, MD;  Location: [facility];  Service: Cardiovascular;  Laterality: N/A;     No outpatient medications have been marked as taking for the 03/29/19 encounter (Appointment) with Richardo Priest, MD.     Allergies:   Lisinopril; Adhesive [tape]; Aspirin; and Neosporin [neomycin-bacitracin zn-polymyx]   Social History   Tobacco Use   Smoking status: Former Smoker    Packs/day: 2.00    Years: 20.00    Pack years: 40.00    Types: Cigarettes   Smokeless tobacco: Former Systems developer    Quit date: 11/23/1998   Tobacco comment: quit 2000  Substance Use Topics   Alcohol use: No   Drug use: No     Family Hx: The patient's family history includes Heart attack in his father. There is no history of Colon cancer or Esophageal cancer.  ROS:   Please see the history of present illness.     All other systems reviewed and are negative.   Prior CV studies:   The following studies were reviewed today:    Labs/Other Tests and Data Reviewed:    EKG:  No ECG reviewed.  Recent Labs: 02/28/2019: BUN 12; Creatinine, Ser 0.90; Hemoglobin 11.1; Magnesium 1.8; Platelets 73; Potassium 4.2; Sodium 135   Recent Lipid Panel Lab Results  Component Value Date/Time   CHOL 150 02/26/2019 03:48 AM   TRIG 88 02/26/2019 03:48 AM   HDL 35 (L) 02/26/2019 03:48 AM   CHOLHDL 4.3 02/26/2019 03:48 AM   LDLCALC 97 02/26/2019 03:48 AM    Wt Readings from Last 3 Encounters:  03/02/19 180 lb 12.8 oz (82 kg)  02/28/19 175 lb 4.8 oz (79.5 kg)  02/21/19 180 lb 3 oz (81.7 kg)     Objective:    Vital Signs:  There were no vitals taken for this visit.   VITAL SIGNS:  reviewed GEN:  no acute distress EYES:  sclerae anicteric, EOMI - Extraocular Movements Intact RESPIRATORY:  normal respiratory effort, symmetric  expansion CARDIOVASCULAR:  lower extremity edema noted SKIN:  no rash, lesions or ulcers. MUSCULOSKELETAL:  no obvious deformities. NEURO:  alert and oriented x 3, no obvious focal deficit PSYCH:  normal affect  ASSESSMENT & PLAN:    1. CAD stable continue current treatment including dual antiplatelet 2. Hyperlipidemia stable continue a statin 3. Hypertension increase his diuretic may require alternative therapy to calcium channel blocker reassess in a few weeks in the office  COVID-19 Education: The signs and symptoms of COVID-19 were discussed with the patient and how to seek care for testing (follow up with PCP or arrange E-visit).  The importance of social distancing was discussed today.  Time:   Today, I have spent 20 minutes with the patient with telehealth technology discussing the above problems.     Medication Adjustments/Labs and Tests Ordered: Current medicines are reviewed at length with the patient today.  Concerns regarding medicines are outlined above.  Tests Ordered: No orders of the defined types were placed in this encounter.   Medication Changes: No orders of the defined types were placed in this encounter.   Disposition:  Follow up in 3 week(s)

## 2019-03-29 ENCOUNTER — Telehealth (INDEPENDENT_AMBULATORY_CARE_PROVIDER_SITE_OTHER): Payer: Medicare Other | Admitting: Cardiology

## 2019-03-29 ENCOUNTER — Encounter: Payer: Self-pay | Admitting: Cardiology

## 2019-03-29 ENCOUNTER — Other Ambulatory Visit: Payer: Self-pay

## 2019-03-29 VITALS — BP 168/75 | HR 70 | Ht 70.5 in | Wt 178.0 lb

## 2019-03-29 DIAGNOSIS — I10 Essential (primary) hypertension: Secondary | ICD-10-CM

## 2019-03-29 DIAGNOSIS — I2510 Atherosclerotic heart disease of native coronary artery without angina pectoris: Secondary | ICD-10-CM | POA: Diagnosis not present

## 2019-03-29 DIAGNOSIS — E785 Hyperlipidemia, unspecified: Secondary | ICD-10-CM

## 2019-03-29 MED ORDER — FUROSEMIDE 20 MG PO TABS: ORAL_TABLET | ORAL | 1 refills | Status: DC

## 2019-03-29 NOTE — Patient Instructions (Signed)
Medication Instructions:  Your physician has recommended you make the following change in your medication:  TAKE FUROSEMIDE 20 MG (1 TAB) ON MON WED AND FRI AND 20 MG (TAB) DAILY ON ALL OTHER DAYS   If you need a refill on your cardiac medications before your next appointment, please call your pharmacy.   Lab work: NOne If you have labs (blood work) drawn today and your tests are completely normal, you will receive your results only by: Marland Kitchen MyChart Message (if you have MyChart) OR . A paper copy in the mail If you have any lab test that is abnormal or we need to change your treatment, we will call you to review the results.  Testing/Procedures: None  Follow-Up: At [facility], you and your health needs are our priority.  As part of our continuing mission to provide you with exceptional heart care, we have created designated Provider Care Teams.  These Care Teams include your primary Cardiologist (physician) and Advanced Practice Providers (APPs -  Physician Assistants and Nurse Practitioners) who all work together to provide you with the care you need, when you need it. You will need a follow up appointment in 3 weeks.   Any Other Special Instructions Will Be Listed Below (If Applicable).

## 2019-03-31 ENCOUNTER — Telehealth: Payer: Medicare Other | Admitting: Cardiology

## 2019-03-31 DIAGNOSIS — C20 Malignant neoplasm of rectum: Secondary | ICD-10-CM | POA: Diagnosis not present

## 2019-03-31 DIAGNOSIS — C7800 Secondary malignant neoplasm of unspecified lung: Secondary | ICD-10-CM | POA: Diagnosis not present

## 2019-03-31 DIAGNOSIS — C218 Malignant neoplasm of overlapping sites of rectum, anus and anal canal: Secondary | ICD-10-CM | POA: Diagnosis not present

## 2019-04-03 DIAGNOSIS — C218 Malignant neoplasm of overlapping sites of rectum, anus and anal canal: Secondary | ICD-10-CM | POA: Diagnosis not present

## 2019-04-03 DIAGNOSIS — C7800 Secondary malignant neoplasm of unspecified lung: Secondary | ICD-10-CM | POA: Diagnosis not present

## 2019-04-04 DIAGNOSIS — I10 Essential (primary) hypertension: Secondary | ICD-10-CM | POA: Diagnosis not present

## 2019-04-04 DIAGNOSIS — I2111 ST elevation (STEMI) myocardial infarction involving right coronary artery: Secondary | ICD-10-CM | POA: Diagnosis not present

## 2019-04-04 DIAGNOSIS — E7800 Pure hypercholesterolemia, unspecified: Secondary | ICD-10-CM | POA: Diagnosis not present

## 2019-04-04 DIAGNOSIS — Z955 Presence of coronary angioplasty implant and graft: Secondary | ICD-10-CM | POA: Diagnosis not present

## 2019-04-05 DIAGNOSIS — Z452 Encounter for adjustment and management of vascular access device: Secondary | ICD-10-CM | POA: Diagnosis not present

## 2019-04-05 DIAGNOSIS — C218 Malignant neoplasm of overlapping sites of rectum, anus and anal canal: Secondary | ICD-10-CM | POA: Diagnosis not present

## 2019-04-08 ENCOUNTER — Other Ambulatory Visit: Payer: Self-pay | Admitting: Cardiology

## 2019-04-10 MED ORDER — FUROSEMIDE 20 MG PO TABS: ORAL_TABLET | ORAL | 3 refills | Status: DC

## 2019-04-18 DIAGNOSIS — C218 Malignant neoplasm of overlapping sites of rectum, anus and anal canal: Secondary | ICD-10-CM | POA: Diagnosis not present

## 2019-04-18 DIAGNOSIS — C7800 Secondary malignant neoplasm of unspecified lung: Secondary | ICD-10-CM | POA: Diagnosis not present

## 2019-04-19 NOTE — Progress Notes (Signed)
Cardiology Office Note:    Date:  04/20/2019   ID:  Dakota Bennett, DOB Jan 19, 1946, MRN [id number]  PCP:  Serita Grammes, MD  Cardiologist:  Shirlee More, MD    Referring MD: Serita Grammes, MD   History Dakota Bennett came to the office was screened had a temperature 99.1 and was transitioned to a phone visit in in his car at his request  ASSESSMENT:    1. Localized edema   2. Essential hypertension   3. CAD in native artery   4. Hyperlipidemia LDL goal <70    PLAN:    In order of pro blems listed above:  1. Localized edema lower extremities secondary to medications gabapentin and calcium channel blocker will discontinue and should improve slowly over the next few weeks.  For now continue his loop diuretic although I may discontinue it at follow-up 2. Retention remains elevated discontinue calcium channel blocker of edema start hydralazine continue to follow blood pressure at home 3. Stable CAD continue his dual antiplatelet therapy platelet counts are greater than 100,000 4. Hyperlipidemia stable continue his high intensity statin and I will ask him to initiate Zetia 10 mg daily to achieve goals if he fails then PCSK9 be appropriate 5. Hypokalemia we will ask him to change to a 20 mEq tablet 3 times daily and have follow-up labs through the cancer center at [facility] 6. Has anemia and thrombocytopenia are worsened   Next appointment: 4 weeks virtual   Medication Adjustments/Labs and Tests Ordered: Current medicines are reviewed at length with the patient today.  Concerns regarding medicines are outlined above.  No orders of the defined types were placed in this encounter.  No orders of the defined types were placed in this encounter.  Chief complaint lower extremity edema  History of Present Illness:    Dakota Bennett is a 73 y.o. male with a hx of a history of stage IIIa colorectal cancer with metastasis to the lungs, hypertension, hyperlipidemia, and type 2 diabetes  mellitus, GERD, chronic hyponatremia, and chronic thrombocytopenia, who presented to the [facility] ED for evaluation of chest pain and was found to have a NSTEMI. He was transferred to [facility] for further evaluation.he underwent left heart cath and PCI 02/27/2019 troponin peaked 0.25 he had PCI and stent to the right coronary artery complicated with an acute dissection requiring 3 drug-eluting stents for control he had residual proximal LAD disease 70% first marginal 50% of recommendation to treat medically unless he has refractory angina as he would require atherectomy for the proximal LAD lesion.  Also of note is that his ventricular function was normal ejection fraction 55 to 65% and he has thrombocytopenia with platelet count in the range of 73,000 and history of metastatic renal cancer. He was last seen 03/29/19 and afterwards developed weight gain and edema started on a loop diuretic.  He handcarried a list of blood pressures that run in the range of 140-160/60-70.  He was seen 03/02/19. Compliance with diet, lifestyle and medications: Yes  Fortunately he is a very understanding man he has had lower extremity edema in the setting of gabapentin amlodipine proBNP level is normal he is not short of breath there is no indication of heart failure.  I reviewed his chemotherapy treatment and they are not cardiotoxins.  Over read is withdrawn these 2 agents substituted hydralazine for hypertension and reassess in 4 weeks.  He has had no angina fatigue exercise intolerance orthopnea palpitation or syncope. Past Medical History:  Diagnosis Date  .  Allergic rhinitis   . Cancer ([place])    rectal cancer and found spots on his lungs also and is on chemo currently   . Diabetes mellitus without complication ([place])    type 2  . GERD (gastroesophageal reflux disease)   . Headache    hx of migraines yrs ago  . Hemorrhoids   . Hyperlipidemia   . Hypertension   . MVP (mitral valve prolapse)    dx 42 yrs  no cardiologist  . Rectal bleeding     Past Surgical History:  Procedure Laterality Date  . COLONOSCOPY  03/03/2017   Proximal rectal mass (biopsied). Transverse colonic polyps status post polypectomy. Mild sigmoid diverticulosis  . CORONARY STENT INTERVENTION N/A 02/27/2019   Procedure: CORONARY STENT INTERVENTION;  Surgeon: Martinique, Peter M, MD;  Location: [facility];  Service: Cardiovascular;  Laterality: N/A;  . EUS N/A 03/18/2017   Procedure: LOWER ENDOSCOPIC ULTRASOUND (EUS);  Surgeon: Milus Banister, MD;  Location: [facility];  Service: Endoscopy;  Laterality: N/A;  . extensive dental work    . FLEXIBLE SIGMOIDOSCOPY N/A 02/21/2019   Procedure: FLEXIBLE SIGMOIDOSCOPY;  Surgeon: Jackquline Denmark, MD;  Location: [facility];  Service: Endoscopy;  Laterality: N/A;  . ingrown toenail removed  age 75 or 41  . LEFT HEART CATH AND CORONARY ANGIOGRAPHY N/A 02/27/2019   Procedure: LEFT HEART CATH AND CORONARY ANGIOGRAPHY;  Surgeon: Martinique, Peter M, MD;  Location: [place] [facility];  Service: Cardiovascular;  Laterality: N/A;    Current Medications: Current Meds  Medication Sig  . acetaminophen (TYLENOL) 500 MG tablet Take 1,000 mg by mouth every 6 (six) hours as needed for headache (pain).  Marland Kitchen alum & mag hydroxide-simeth (MAALOX/MYLANTA) 200-200-20 MG/5ML suspension Take 10 mLs by mouth 3 (three) times daily as needed for indigestion or heartburn.  . AMBULATORY NON FORMULARY MEDICATION Currently on 3 chemo medication  and have a pump that is connected for 2 days. Pump bag is fluorouracil 3500mg  CIV mixed with sodium chloride 0.9%  . amLODipine (NORVASC) 5 MG tablet Take 1 tablet (5 mg total) by mouth daily.  Marland Kitchen aspirin EC 81 MG EC tablet Take 1 tablet (81 mg total) by mouth daily.  Marland Kitchen atorvastatin (LIPITOR) 80 MG tablet Take 1 tablet (80 mg total) by mouth daily at 6 PM.  . clopidogrel (PLAVIX) 75 MG tablet Take 1 tablet (75 mg total) by mouth daily with breakfast.  .  diphenoxylate-atropine (LOMOTIL) 2.5-0.025 MG tablet Take 1 tablet by mouth 4 (four) times daily as needed for diarrhea or loose stools.  . famotidine (PEPCID) 20 MG tablet Take 1 tablet (20 mg total) by mouth as needed for heartburn or indigestion.  . furosemide (LASIX) 20 MG tablet Take 2 tab (40mg ) daily. On Mondays, Wednesdays and Fridays take 2  tabs in morning and 1 tab in eve. (60mg  total)  . gabapentin (NEURONTIN) 300 MG capsule Take 300 mg by mouth 2 (two) times daily.  . isosorbide mononitrate (IMDUR) 30 MG 24 hr tablet Take 1 tablet (30 mg total) by mouth daily.  . metFORMIN (GLUCOPHAGE-XR) 500 MG 24 hr tablet Take 500 mg by mouth daily with breakfast.   . metoprolol succinate (TOPROL-XL) 100 MG 24 hr tablet Take 100 mg by mouth daily. Take with or immediately following a meal.  . nitroGLYCERIN (NITROSTAT) 0.4 MG SL tablet Place 1 tablet (0.4 mg total) under the tongue every 5 (five) minutes as needed for chest pain.  . pantoprazole (PROTONIX) 40 MG tablet Take  40 mg by mouth daily.  . potassium chloride (K-DUR) 10 MEQ tablet Take 10 mEq by mouth 3 (three) times daily.  . prochlorperazine (COMPAZINE) 10 MG tablet Take 10 mg by mouth 3 (three) times daily as needed for nausea/vomiting.     Allergies:   Lisinopril; Adhesive [tape]; Aspirin; and Neosporin [neomycin-bacitracin zn-polymyx]   Social History   Socioeconomic History  . Marital status: Married    Spouse name: Not on file  . Number of children: 3  . Years of education: Not on file  . Highest education level: Not on file  Occupational History  . Not on file  Social Needs  . Financial resource strain: Not on file  . Food insecurity:    Worry: Not on file    Inability: Not on file  . Transportation needs:    Medical: Not on file    Non-medical: Not on file  Tobacco Use  . Smoking status: Former Smoker    Packs/day: 2.00    Years: 20.00    Pack years: 40.00    Types: Cigarettes  . Smokeless tobacco: Former Systems developer     Quit date: 11/23/1998  . Tobacco comment: quit 2000  Substance and Sexual Activity  . Alcohol use: No  . Drug use: No  . Sexual activity: Not on file  Lifestyle  . Physical activity:    Days per week: Not on file    Minutes per session: Not on file  . Stress: Not on file  Relationships  . Social connections:    Talks on phone: Not on file    Gets together: Not on file    Attends religious service: Not on file    Active member of club or organization: Not on file    Attends meetings of clubs or organizations: Not on file    Relationship status: Not on file  Other Topics Concern  . Not on file  Social History Narrative  . Not on file     Family History: The patient's family history includes Heart attack in his father; Prostate cancer in his father. There is no history of Colon cancer or Esophageal cancer. ROS:   Please see the history of present illness.    All other systems reviewed and are negative.  EKGs/Labs/Other Studies Reviewed:    The following studies were reviewed today:  Recent Labs: 04/18/19 04/18/2019 CBC showed a hemoglobin of 9.4 platelet count 84,000 CMP shows a potassium of 3.3 GFR greater than 60 cc magnesium 1.9 his cholesterol is 194 LDL 37 LDL 125 03/31/19: Pro BNP 169, CMP normal Cr 0.6 and K 3.8  02/28/2019: BUN 12; Creatinine, Ser 0.90; Hemoglobin 11.1; Magnesium 1.8; Platelets 73; Potassium 4.2; Sodium 135  Recent Lipid Panel    Component Value Date/Time   CHOL 150 02/26/2019 0348   TRIG 88 02/26/2019 0348   HDL 35 (L) 02/26/2019 0348   CHOLHDL 4.3 02/26/2019 0348   VLDL 18 02/26/2019 0348   LDLCALC 97 02/26/2019 0348    Physical Exam:    VS:  Ht 5' 10.5" (1.791 m)   Wt 181 lb (82.1 kg)   BMI 25.60 kg/m     Wt Readings from Last 3 Encounters:  04/20/19 181 lb (82.1 kg)  03/29/19 178 lb (80.7 kg)  03/02/19 180 lb 12.8 oz (82 kg)     VS reviewed Mood affect thought and cognition are normal alert and oriented no audible wheezing or  respiratory distress in conversation   Signed, Shirlee More, MD  04/20/2019 3:59 PM    [facility]

## 2019-04-20 ENCOUNTER — Telehealth (INDEPENDENT_AMBULATORY_CARE_PROVIDER_SITE_OTHER): Payer: Medicare Other | Admitting: Cardiology

## 2019-04-20 ENCOUNTER — Encounter: Payer: Self-pay | Admitting: Cardiology

## 2019-04-20 VITALS — Ht 70.5 in | Wt 181.0 lb

## 2019-04-20 DIAGNOSIS — R600 Localized edema: Secondary | ICD-10-CM

## 2019-04-20 DIAGNOSIS — E785 Hyperlipidemia, unspecified: Secondary | ICD-10-CM

## 2019-04-20 DIAGNOSIS — I10 Essential (primary) hypertension: Secondary | ICD-10-CM

## 2019-04-20 DIAGNOSIS — R609 Edema, unspecified: Secondary | ICD-10-CM | POA: Insufficient documentation

## 2019-04-20 DIAGNOSIS — I2510 Atherosclerotic heart disease of native coronary artery without angina pectoris: Secondary | ICD-10-CM

## 2019-04-20 HISTORY — DX: Edema, unspecified: R60.9

## 2019-04-20 MED ORDER — POTASSIUM CHLORIDE CRYS ER 10 MEQ PO TBCR: 20.0000 meq | EXTENDED_RELEASE_TABLET | Freq: Three times a day (TID) | ORAL | 2 refills | Status: DC

## 2019-04-20 MED ORDER — HYDRALAZINE HCL 25 MG PO TABS: 25.0000 mg | ORAL_TABLET | Freq: Two times a day (BID) | ORAL | 2 refills | Status: DC

## 2019-04-20 MED ORDER — EZETIMIBE 10 MG PO TABS: 10.0000 mg | ORAL_TABLET | Freq: Every day | ORAL | 3 refills | Status: DC

## 2019-04-20 NOTE — Patient Instructions (Addendum)
Medication Instructions:  Your physician has recommended you make the following change in your medication: STOP: Gabapentin (Neurontin) STOP: Amlodipine (Norvasc) START: Hydralazine 25mg  (1 tab) twice daily START: Zetia 10 mg (1 Tab) daily INCREASE: Potassium to 20 meq (1 Tab) three times daily   If you need a refill on your cardiac medications before your next appointment, please call your pharmacy.   Lab work: None If you have labs (blood work) drawn today and your tests are completely normal, you will receive your results only by: Marland Kitchen MyChart Message (if you have MyChart) OR . A paper copy in the mail If you have any lab test that is abnormal or we need to change your treatment, we will call you to review the results.  Testing/Procedures: None  Follow-Up: At [facility], you and your health needs are our priority.  As part of our continuing mission to provide you with exceptional heart care, we have created designated Provider Care Teams.  These Care Teams include your primary Cardiologist (physician) and Advanced Practice Providers (APPs -  Physician Assistants and Nurse Practitioners) who all work together to provide you with the care you need, when you need it. You will need a follow up appointment in 4 weeks.   Any Other Special Instructions Will Be Listed Below (If Applicable).   CHECK BLOOD PRESSURES DAILY AND RECORD     Hydralazine tablets What is this medicine? HYDRALAZINE (hye DRAL a zeen) is a type of vasodilator. It relaxes blood vessels, increasing the blood and oxygen supply to your heart. This medicine is used to treat high blood pressure. This medicine may be used for other purposes; ask your health care provider or pharmacist if you have questions. COMMON BRAND NAME(S): Apresoline What should I tell my health care provider before I take this medicine? They need to know if you have any of these conditions: -blood vessel disease -heart disease including  angina or history of heart attack -kidney or liver disease -systemic lupus erythematosus (SLE) -an unusual or allergic reaction to hydralazine, tartrazine dye, other medicines, foods, dyes, or preservatives -pregnant or trying to get pregnant -breast-feeding How should I use this medicine? Take this medicine by mouth with a glass of water. Follow the directions on the prescription label. Take your doses at regular intervals. Do not take your medicine more often than directed. Do not stop taking except on the advice of your doctor or health care professional. Talk to your pediatrician regarding the use of this medicine in children. Special care may be needed. While this drug may be prescribed for children for selected conditions, precautions do apply. Overdosage: If you think you have taken too much of this medicine contact a poison control center or emergency room at once. NOTE: This medicine is only for you. Do not share this medicine with others. What if I miss a dose? If you miss a dose, take it as soon as you can. If it is almost time for your next dose, take only that dose. Do not take double or extra doses. What may interact with this medicine? -medicines for high blood pressure -medicines for mental depression This list may not describe all possible interactions. Give your health care provider a list of all the medicines, herbs, non-prescription drugs, or dietary supplements you use. Also tell them if you smoke, drink alcohol, or use illegal drugs. Some items may interact with your medicine. What should I watch for while using this medicine? Visit your doctor or health care professional  for regular checks on your progress. Check your blood pressure and pulse rate regularly. Ask your doctor or health care professional what your blood pressure and pulse rate should be and when you should contact him or her. You may get drowsy or dizzy. Do not drive, use machinery, or do anything that needs  mental alertness until you know how this medicine affects you. Do not stand or sit up quickly, especially if you are an older patient. This reduces the risk of dizzy or fainting spells. Alcohol may interfere with the effect of this medicine. Avoid alcoholic drinks. Do not treat yourself for coughs, colds, or pain while you are taking this medicine without asking your doctor or health care professional for advice. Some ingredients may increase your blood pressure. What side effects may I notice from receiving this medicine? Side effects that you should report to your doctor or health care professional as soon as possible: -chest pain, or fast or irregular heartbeat -fever, chills, or sore throat -numbness or tingling in the hands or feet -shortness of breath -skin rash, redness, blisters or itching -stiff or swollen joints -sudden weight gain -swelling of the feet or legs -swollen lymph glands -unusual weakness Side effects that usually do not require medical attention (report to your doctor or health care professional if they continue or are bothersome): -diarrhea, or constipation -headache -loss of appetite -nausea, vomiting This list may not describe all possible side effects. Call your doctor for medical advice about side effects. You may report side effects to FDA at 1-800-FDA-1088. Where should I keep my medicine? Keep out of the reach of children. Store at room temperature between 15 and 30 degrees C (59 and 86 degrees F). Throw away any unused medicine after the expiration date. NOTE: This sheet is a summary. It may not cover all possible information. If you have questions about this medicine, talk to your doctor, pharmacist, or health care provider.  2019 Elsevier/Gold Standard (2008-03-23 15:44:58)   Ezetimibe Tablets What is this medicine? EZETIMIBE (ez ET i mibe) blocks the absorption of cholesterol from the stomach. It can help lower blood cholesterol for patients who are at  risk of getting heart disease or a stroke. It is only for patients whose cholesterol level is not controlled by diet. This medicine may be used for other purposes; ask your health care provider or pharmacist if you have questions. COMMON BRAND NAME(S): Zetia What should I tell my health care provider before I take this medicine? They need to know if you have any of these conditions: -liver disease -an unusual or allergic reaction to ezetimibe, medicines, foods, dyes, or preservatives -pregnant or trying to get pregnant -breast-feeding How should I use this medicine? Take this medicine by mouth with a glass of water. Follow the directions on the prescription label. This medicine can be taken with or without food. Take your doses at regular intervals. Do not take your medicine more often than directed. Talk to your pediatrician regarding the use of this medicine in children. Special care may be needed. Overdosage: If you think you have taken too much of this medicine contact a poison control center or emergency room at once. NOTE: This medicine is only for you. Do not share this medicine with others. What if I miss a dose? If you miss a dose, take it as soon as you can. If it is almost time for your next dose, take only that dose. Do not take double or extra doses. What may interact  with this medicine? Do not take this medicine with any of the following medications: -fenofibrate -gemfibrozil This medicine may also interact with the following medications: -antacids -cyclosporine -herbal medicines like red yeast rice -other medicines to lower cholesterol or triglycerides This list may not describe all possible interactions. Give your health care provider a list of all the medicines, herbs, non-prescription drugs, or dietary supplements you use. Also tell them if you smoke, drink alcohol, or use illegal drugs. Some items may interact with your medicine. What should I watch for while using this  medicine? Visit your doctor or health care professional for regular checks on your progress. You will need to have your cholesterol levels checked. If you are also taking some other cholesterol medicines, you will also need to have tests to make sure your liver is working properly. Tell your doctor or health care professional if you get any unexplained muscle pain, tenderness, or weakness, especially if you also have a fever and tiredness. You need to follow a low-cholesterol, low-fat diet while you are taking this medicine. This will decrease your risk of getting heart and blood vessel disease. Exercising and avoiding alcohol and smoking can also help. Ask your doctor or dietician for advice. What side effects may I notice from receiving this medicine? Side effects that you should report to your doctor or health care professional as soon as possible: -allergic reactions like skin rash, itching or hives, swelling of the face, lips, or tongue -dark yellow or brown urine -unusually weak or tired -yellowing of the skin or eyes Side effects that usually do not require medical attention (report to your doctor or health care professional if they continue or are bothersome): -diarrhea -dizziness -headache -stomach upset or pain This list may not describe all possible side effects. Call your doctor for medical advice about side effects. You may report side effects to FDA at 1-800-FDA-1088. Where should I keep my medicine? Keep out of the reach of children. Store at room temperature between 15 and 30 degrees C (59 and 86 degrees F). Protect from moisture. Keep container tightly closed. Throw away any unused medicine after the expiration date. NOTE: This sheet is a summary. It may not cover all possible information. If you have questions about this medicine, talk to your doctor, pharmacist, or health care provider.  2019 Elsevier/Gold Standard (2012-05-16 15:39:09)

## 2019-04-29 ENCOUNTER — Other Ambulatory Visit: Payer: Self-pay | Admitting: Cardiology

## 2019-05-01 DIAGNOSIS — C218 Malignant neoplasm of overlapping sites of rectum, anus and anal canal: Secondary | ICD-10-CM | POA: Diagnosis not present

## 2019-05-01 DIAGNOSIS — C7800 Secondary malignant neoplasm of unspecified lung: Secondary | ICD-10-CM | POA: Diagnosis not present

## 2019-05-03 DIAGNOSIS — C218 Malignant neoplasm of overlapping sites of rectum, anus and anal canal: Secondary | ICD-10-CM | POA: Diagnosis not present

## 2019-05-03 DIAGNOSIS — C7800 Secondary malignant neoplasm of unspecified lung: Secondary | ICD-10-CM | POA: Diagnosis not present

## 2019-05-03 DIAGNOSIS — Z452 Encounter for adjustment and management of vascular access device: Secondary | ICD-10-CM | POA: Diagnosis not present

## 2019-05-15 DIAGNOSIS — C218 Malignant neoplasm of overlapping sites of rectum, anus and anal canal: Secondary | ICD-10-CM

## 2019-05-15 DIAGNOSIS — C7800 Secondary malignant neoplasm of unspecified lung: Secondary | ICD-10-CM

## 2019-05-17 DIAGNOSIS — Z452 Encounter for adjustment and management of vascular access device: Secondary | ICD-10-CM | POA: Diagnosis not present

## 2019-05-17 DIAGNOSIS — C218 Malignant neoplasm of overlapping sites of rectum, anus and anal canal: Secondary | ICD-10-CM | POA: Diagnosis not present

## 2019-05-24 DIAGNOSIS — I119 Hypertensive heart disease without heart failure: Secondary | ICD-10-CM | POA: Insufficient documentation

## 2019-05-24 HISTORY — DX: Hypertensive heart disease without heart failure: I11.9

## 2019-05-24 NOTE — Progress Notes (Signed)
Virtual Visit via Video Note   This visit type was conducted due to national recommendations for restrictions regarding the COVID-19 Pandemic (e.g. social distancing) in an effort to limit this patient's exposure and mitigate transmission in our community.  Due to his co-morbid illnesses, this patient is at least at moderate risk for complications without adequate follow up.  This format is felt to be most appropriate for this patient at this time.  All issues noted in this document were discussed and addressed.  A limited physical exam was performed with this format.  Please refer to the patient's chart for his consent to telehealth for [facility].   Date:  05/25/2019   ID:  Verita Schneiders, DOB Jun 17, 1946, MRN [id number]  Patient Location: Home Provider Location: Office  PCP:  Serita Grammes, MD  Cardiologist:  Shirlee More, MD  Electrophysiologist:  None   Evaluation Performed:  Follow-Up Visit  Chief Complaint:  73 yo male presents for follow up of CAD and HTN after medication changes.   History of Present Illness:    Dakota Bennett is a 73 y.o. male with with a hx of CAD last seen 03/29/2019. At that time his diuretic was increased due to HTN not at goal.  History of stage IIIa colorectal cancer with metastasis to the lungs, HTN, HLD, DM2, GERD, chronic hyponatremia, and chronic thrombocytopenia. CAD with NSTEMI and PCI 02/27/2019 s/p stent to the RCA complicated with acute dissection requiring 3 drug-eluting stents for control. Residual proximal LAD disease 70% first marginal 50% of recommendation to treat medically unless he has refractory angina as he would require atherectomy. Ventricular function was normal ejection fraction 55 to 65%.  He reports his heart is "good". Reports felling well, but some fatigue by end of day. He works as a Ship broker. He tells me his breathing is better and he is not short of breath nor dyspneic on exertion. We  again discussed cardiac rehab and he politely declines as he is walking on his own. Encouraged to continue. Denies chest pain.   He checks his BP twice per day and reports the average reading in 160/70. He does not feel it has changed much since his medications were changed at last office visit.  Checks his weight daily. Reports no change in weight. Reports lower extremity edema has improved since stopping Norvasc. Intermittent edema which is improved after his current lasix regimen.   The patient does not have symptoms concerning for COVID-19 infection (fever, chills, cough, or new shortness of breath).    Past Medical History:  Diagnosis Date   Allergic rhinitis    Cancer ([place])    rectal cancer and found spots on his lungs also and is on chemo currently    Diabetes mellitus without complication (HCC)    type 2   GERD (gastroesophageal reflux disease)    Headache    hx of migraines yrs ago   Hemorrhoids    Hyperlipidemia    Hypertension    MVP (mitral valve prolapse)    dx 42 yrs no cardiologist   Rectal bleeding    Past Surgical History:  Procedure Laterality Date   COLONOSCOPY  03/03/2017   Proximal rectal mass (biopsied). Transverse colonic polyps status post polypectomy. Mild sigmoid diverticulosis   CORONARY STENT INTERVENTION N/A 02/27/2019   Procedure: CORONARY STENT INTERVENTION;  Surgeon: Martinique, Peter M, MD;  Location: [place] [facility];  Service: Cardiovascular;  Laterality: N/A;   EUS N/A 03/18/2017  Procedure: LOWER ENDOSCOPIC ULTRASOUND (EUS);  Surgeon: Milus Banister, MD;  Location: [facility];  Service: Endoscopy;  Laterality: N/A;   extensive dental work     FLEXIBLE SIGMOIDOSCOPY N/A 02/21/2019   Procedure: FLEXIBLE SIGMOIDOSCOPY;  Surgeon: Jackquline Denmark, MD;  Location: [facility];  Service: Endoscopy;  Laterality: N/A;   ingrown toenail removed  age 42 or 20   LEFT HEART CATH AND CORONARY ANGIOGRAPHY N/A 02/27/2019   Procedure: LEFT HEART  CATH AND CORONARY ANGIOGRAPHY;  Surgeon: Martinique, Peter M, MD;  Location: [place] [facility];  Service: Cardiovascular;  Laterality: N/A;     Current Meds  Medication Sig   acetaminophen (TYLENOL) 500 MG tablet Take 1,000 mg by mouth every 6 (six) hours as needed for headache (pain).   alum & mag hydroxide-simeth (MAALOX/MYLANTA) 200-200-20 MG/5ML suspension Take 10 mLs by mouth 3 (three) times daily as needed for indigestion or heartburn.   AMBULATORY NON FORMULARY MEDICATION Currently on 3 chemo medication  and have a pump that is connected for 2 days. Pump bag is fluorouracil 3500mg  CIV mixed with sodium chloride 0.9%   aspirin EC 81 MG EC tablet Take 1 tablet (81 mg total) by mouth daily.   atorvastatin (LIPITOR) 80 MG tablet Take 1 tablet (80 mg total) by mouth daily at 6 PM.   clopidogrel (PLAVIX) 75 MG tablet Take 1 tablet (75 mg total) by mouth daily with breakfast.   diphenoxylate-atropine (LOMOTIL) 2.5-0.025 MG tablet Take 1 tablet by mouth 4 (four) times daily as needed for diarrhea or loose stools.   ezetimibe (ZETIA) 10 MG tablet Take 1 tablet (10 mg total) by mouth daily.   famotidine (PEPCID) 20 MG tablet Take 1 tablet (20 mg total) by mouth as needed for heartburn or indigestion.   furosemide (LASIX) 20 MG tablet Take 2 tab (40mg ) daily. On Mondays, Wednesdays and Fridays take 2  tabs in morning and 1 tab in eve. (60mg  total)   hydrALAZINE (APRESOLINE) 25 MG tablet Take 1 tablet (25 mg total) by mouth 3 (three) times daily.   isosorbide mononitrate (IMDUR) 30 MG 24 hr tablet Take 1 tablet (30 mg total) by mouth daily.   KLOR-CON M10 10 MEQ tablet TAKE 2 TABLETS (20 MEQ TOTAL) BY MOUTH 3 (THREE) TIMES DAILY.   metFORMIN (GLUCOPHAGE-XR) 500 MG 24 hr tablet Take 500 mg by mouth daily with breakfast.    metoprolol succinate (TOPROL-XL) 100 MG 24 hr tablet Take 100 mg by mouth daily. Take with or immediately following a meal.   nitroGLYCERIN (NITROSTAT) 0.4 MG SL tablet  Place 1 tablet (0.4 mg total) under the tongue every 5 (five) minutes as needed for chest pain.   pantoprazole (PROTONIX) 40 MG tablet Take 40 mg by mouth daily.   prochlorperazine (COMPAZINE) 10 MG tablet Take 10 mg by mouth 3 (three) times daily as needed for nausea/vomiting.   [DISCONTINUED] hydrALAZINE (APRESOLINE) 25 MG tablet Take 1 tablet (25 mg total) by mouth 2 (two) times a day.     Allergies:   Lisinopril, Adhesive [tape], Aspirin, and Neosporin [neomycin-bacitracin zn-polymyx]   Social History   Tobacco Use   Smoking status: Former Smoker    Packs/day: 2.00    Years: 20.00    Pack years: 40.00    Types: Cigarettes   Smokeless tobacco: Former Systems developer    Quit date: 11/23/1998   Tobacco comment: quit 2000  Substance Use Topics   Alcohol use: No   Drug use: No     Family  Hx: The patient's family history includes Heart attack in his father; Prostate cancer in his father. There is no history of Colon cancer or Esophageal cancer.  ROS:   Please see the history of present illness.    Review of Systems  Constitution: Positive for malaise/fatigue (at end of day). Negative for chills and fever.  Cardiovascular: Positive for leg swelling (improved, intermittent). Negative for chest pain, dyspnea on exertion, irregular heartbeat, near-syncope, palpitations and paroxysmal nocturnal dyspnea.  Respiratory: Negative for cough, shortness of breath and wheezing.   Neurological: Negative for dizziness, light-headedness and weakness.    All other systems reviewed and are negative.   Prior CV studies:   The following studies were reviewed today:  Cardiac cath 02/27/2019  Prox LAD lesion is 70% stenosed.  Ost 1st Mrg lesion is 50% stenosed.  RPDA lesion is 80% stenosed.  Prox RCA to Mid RCA lesion is 90% stenosed.  Post intervention, there is a 0% residual stenosis.  A drug-eluting stent was successfully placed using a STENT RESOLUTE ONYX 3.5X38.  A drug-eluting stent  was successfully placed using a STENT RESOLUTE AJOI7.8M76.  A drug-eluting stent was successfully placed using a STENT RESOLUTE ONYX 2.5X18.  The left ventricular systolic function is normal.  LV end diastolic pressure is normal.  The left ventricular ejection fraction is 55-65% by visual estimate.   1. 2 vessel obstructive CAD    - 70% mid LAD-heavily calcified    - 90% segmental stenosis in the mid RCA    - 80% mid PDA 2. Normal LV function 3. Normal LVEDP 4. Successful PCI of the RCA. Procedure complicated by acute dissection and vessel closure. This was successfully managed with DES x 3.    Plan: DAPT for one year. I would treat the residual disease in the LAD and PDA medically. The LAD would require atherectomy to treat. Given his overall prognosis I would not treat this with PCI unless he has refractory angina.    Labs/Other Tests and Data Reviewed:    EKG:  No ECG reviewed.  Recent Labs: 02/28/2019: BUN 12; Creatinine, Ser 0.90; Hemoglobin 11.1; Magnesium 1.8; Platelets 73; Potassium 4.2; Sodium 135   Recent Lipid Panel Lab Results  Component Value Date/Time   CHOL 150 02/26/2019 03:48 AM   TRIG 88 02/26/2019 03:48 AM   HDL 35 (L) 02/26/2019 03:48 AM   CHOLHDL 4.3 02/26/2019 03:48 AM   LDLCALC 97 02/26/2019 03:48 AM    Wt Readings from Last 3 Encounters:  05/25/19 176 lb (79.8 kg)  04/20/19 181 lb (82.1 kg)  03/29/19 178 lb (80.7 kg)     Objective:    Vital Signs:  BP (!) 168/74    Pulse 72    Ht 5' 10.5" (1.791 m)    Wt 176 lb (79.8 kg)    BMI 24.90 kg/m    VITAL SIGNS:  reviewed GEN:  no acute distress NEURO:  alert and oriented x 3, no obvious focal deficit PSYCH:  normal affect  ASSESSMENT & PLAN:    1. Hypertensive heart disease - BP not at goal - home readings consistently 160/70. Change Hydralazine from BID to TID. Consider additional agent if SBP remains consistently >140. He will call office in 2 weeks with report of BP. Normal kidney function on  labs 04/03/19. 2. CAD - Stable. GDMT aspirin, statin, beta blocker. DAPT plavix and aspirin recommended continue for 1 year with start of 02/2019. Politely declines cardiac rehab, encouraged to continue daily walking regimen.  3.  HLD LDL goal <70 - Continue statin and zetia. Will ask Shannon to get lipid and CMP to assess liver function at appointment next week.  4. Hypokalemia - Resolved on recent labs (K3/8 on 04/03/19) after Kclor frequency changed. Cancer center follows closely.  5. Localized lower extremity edema - Improved since discontinuation of amlodipine. Reports intermittent LE edema. Continue Lasix. Counseled that some edema may be secondary to gabapentin.  6. Throbocytopenia - Stable on labs 04/03/2019. Follows carefully with cancer center.   COVID-19 Education: The signs and symptoms of COVID-19 were discussed with the patient and how to seek care for testing (follow up with PCP or arrange E-visit).  The importance of social distancing was discussed today.  Time:   Today, I have spent 25 minutes with the patient with telehealth technology discussing the above problems.     Medication Adjustments/Labs and Tests Ordered: Current medicines are reviewed at length with the patient today.  Concerns regarding medicines are outlined above.   Tests Ordered: No orders of the defined types were placed in this encounter.   Medication Changes: Meds ordered this encounter  Medications   hydrALAZINE (APRESOLINE) 25 MG tablet    Sig: Take 1 tablet (25 mg total) by mouth 3 (three) times daily.    Dispense:  270 tablet    Refill:  0    Follow Up:  In Person in 3 month(s)  Signed, Shirlee More, MD  05/25/2019 4:23 PM    [place]

## 2019-05-25 ENCOUNTER — Encounter: Payer: Self-pay | Admitting: Cardiology

## 2019-05-25 ENCOUNTER — Telehealth (INDEPENDENT_AMBULATORY_CARE_PROVIDER_SITE_OTHER): Payer: Medicare Other | Admitting: Cardiology

## 2019-05-25 ENCOUNTER — Other Ambulatory Visit: Payer: Self-pay

## 2019-05-25 VITALS — BP 168/74 | HR 72 | Ht 70.5 in | Wt 176.0 lb

## 2019-05-25 DIAGNOSIS — D696 Thrombocytopenia, unspecified: Secondary | ICD-10-CM

## 2019-05-25 DIAGNOSIS — R600 Localized edema: Secondary | ICD-10-CM

## 2019-05-25 DIAGNOSIS — I119 Hypertensive heart disease without heart failure: Secondary | ICD-10-CM | POA: Diagnosis not present

## 2019-05-25 DIAGNOSIS — E785 Hyperlipidemia, unspecified: Secondary | ICD-10-CM | POA: Diagnosis not present

## 2019-05-25 DIAGNOSIS — E876 Hypokalemia: Secondary | ICD-10-CM

## 2019-05-25 DIAGNOSIS — I2510 Atherosclerotic heart disease of native coronary artery without angina pectoris: Secondary | ICD-10-CM

## 2019-05-25 HISTORY — DX: Localized edema: R60.0

## 2019-05-25 MED ORDER — HYDRALAZINE HCL 25 MG PO TABS: 25.0000 mg | ORAL_TABLET | Freq: Three times a day (TID) | ORAL | 0 refills | Status: DC

## 2019-05-25 NOTE — Patient Instructions (Signed)
Medication Instructions:  CHANGE Hydralazine to THREE times per day  If you need a refill on your cardiac medications before your next appointment, please call your pharmacy.   Lab work: Will ask the cancer center to get lipid profile and CMP at your next visit.  If you have labs (blood work) drawn today and your tests are completely normal, you will receive your results only by: Marland Kitchen MyChart Message (if you have MyChart) OR . A paper copy in the mail If you have any lab test that is abnormal or we need to change your treatment, we will call you to review the results.  Testing/Procedures: None  Follow-Up: At [facility], you and your health needs are our priority.  As part of our continuing mission to provide you with exceptional heart care, we have created designated Provider Care Teams.  These Care Teams include your primary Cardiologist (physician) and Advanced Practice Providers (APPs -  Physician Assistants and Nurse Practitioners) who all work together to provide you with the care you need, when you need it.  You will need a follow up appointment in 3 months.   Any Other Special Instructions Will Be Listed Below (If Applicable). Continue to check BP and weight daily. Please call office in 2 weeks with report of blood pressures at home.  If top number (systolic blood pressure) is consistently greater than 140 please call our office.

## 2019-05-29 DIAGNOSIS — C7800 Secondary malignant neoplasm of unspecified lung: Secondary | ICD-10-CM | POA: Diagnosis not present

## 2019-05-29 DIAGNOSIS — C218 Malignant neoplasm of overlapping sites of rectum, anus and anal canal: Secondary | ICD-10-CM | POA: Diagnosis not present

## 2019-05-29 DIAGNOSIS — E785 Hyperlipidemia, unspecified: Secondary | ICD-10-CM | POA: Diagnosis not present

## 2019-05-29 DIAGNOSIS — E538 Deficiency of other specified B group vitamins: Secondary | ICD-10-CM | POA: Diagnosis not present

## 2019-05-31 DIAGNOSIS — D509 Iron deficiency anemia, unspecified: Secondary | ICD-10-CM | POA: Diagnosis not present

## 2019-05-31 DIAGNOSIS — C7801 Secondary malignant neoplasm of right lung: Secondary | ICD-10-CM | POA: Diagnosis not present

## 2019-05-31 DIAGNOSIS — C218 Malignant neoplasm of overlapping sites of rectum, anus and anal canal: Secondary | ICD-10-CM | POA: Diagnosis not present

## 2019-05-31 DIAGNOSIS — C7802 Secondary malignant neoplasm of left lung: Secondary | ICD-10-CM | POA: Diagnosis not present

## 2019-05-31 DIAGNOSIS — Z515 Encounter for palliative care: Secondary | ICD-10-CM | POA: Diagnosis not present

## 2019-06-01 ENCOUNTER — Other Ambulatory Visit: Payer: Self-pay | Admitting: Cardiology

## 2019-06-07 DIAGNOSIS — D509 Iron deficiency anemia, unspecified: Secondary | ICD-10-CM | POA: Diagnosis not present

## 2019-06-07 DIAGNOSIS — C7800 Secondary malignant neoplasm of unspecified lung: Secondary | ICD-10-CM | POA: Diagnosis not present

## 2019-06-07 DIAGNOSIS — C218 Malignant neoplasm of overlapping sites of rectum, anus and anal canal: Secondary | ICD-10-CM | POA: Diagnosis not present

## 2019-06-12 DIAGNOSIS — C218 Malignant neoplasm of overlapping sites of rectum, anus and anal canal: Secondary | ICD-10-CM | POA: Diagnosis not present

## 2019-06-12 DIAGNOSIS — C7801 Secondary malignant neoplasm of right lung: Secondary | ICD-10-CM | POA: Diagnosis not present

## 2019-06-12 DIAGNOSIS — C7802 Secondary malignant neoplasm of left lung: Secondary | ICD-10-CM | POA: Diagnosis not present

## 2019-06-12 DIAGNOSIS — C7800 Secondary malignant neoplasm of unspecified lung: Secondary | ICD-10-CM

## 2019-06-14 DIAGNOSIS — Z452 Encounter for adjustment and management of vascular access device: Secondary | ICD-10-CM | POA: Diagnosis not present

## 2019-06-14 DIAGNOSIS — C7800 Secondary malignant neoplasm of unspecified lung: Secondary | ICD-10-CM | POA: Diagnosis not present

## 2019-06-14 DIAGNOSIS — C218 Malignant neoplasm of overlapping sites of rectum, anus and anal canal: Secondary | ICD-10-CM | POA: Diagnosis not present

## 2019-06-22 DIAGNOSIS — Z6825 Body mass index (BMI) 25.0-25.9, adult: Secondary | ICD-10-CM | POA: Diagnosis not present

## 2019-06-22 DIAGNOSIS — E1165 Type 2 diabetes mellitus with hyperglycemia: Secondary | ICD-10-CM | POA: Diagnosis not present

## 2019-06-22 DIAGNOSIS — I10 Essential (primary) hypertension: Secondary | ICD-10-CM | POA: Diagnosis not present

## 2019-06-23 DIAGNOSIS — R161 Splenomegaly, not elsewhere classified: Secondary | ICD-10-CM | POA: Diagnosis not present

## 2019-06-23 DIAGNOSIS — C218 Malignant neoplasm of overlapping sites of rectum, anus and anal canal: Secondary | ICD-10-CM | POA: Diagnosis not present

## 2019-06-23 DIAGNOSIS — C20 Malignant neoplasm of rectum: Secondary | ICD-10-CM | POA: Diagnosis not present

## 2019-06-23 DIAGNOSIS — K4020 Bilateral inguinal hernia, without obstruction or gangrene, not specified as recurrent: Secondary | ICD-10-CM | POA: Diagnosis not present

## 2019-06-23 DIAGNOSIS — R918 Other nonspecific abnormal finding of lung field: Secondary | ICD-10-CM | POA: Diagnosis not present

## 2019-06-26 DIAGNOSIS — C7800 Secondary malignant neoplasm of unspecified lung: Secondary | ICD-10-CM | POA: Diagnosis not present

## 2019-06-26 DIAGNOSIS — C218 Malignant neoplasm of overlapping sites of rectum, anus and anal canal: Secondary | ICD-10-CM | POA: Diagnosis not present

## 2019-07-05 DIAGNOSIS — Z20828 Contact with and (suspected) exposure to other viral communicable diseases: Secondary | ICD-10-CM | POA: Diagnosis not present

## 2019-07-15 ENCOUNTER — Other Ambulatory Visit: Payer: Self-pay | Admitting: Cardiology

## 2019-07-16 ENCOUNTER — Other Ambulatory Visit: Payer: Self-pay | Admitting: Cardiology

## 2019-07-24 DIAGNOSIS — C218 Malignant neoplasm of overlapping sites of rectum, anus and anal canal: Secondary | ICD-10-CM

## 2019-07-24 DIAGNOSIS — C7800 Secondary malignant neoplasm of unspecified lung: Secondary | ICD-10-CM

## 2019-08-13 ENCOUNTER — Other Ambulatory Visit: Payer: Self-pay | Admitting: Cardiology

## 2019-08-21 DIAGNOSIS — C218 Malignant neoplasm of overlapping sites of rectum, anus and anal canal: Secondary | ICD-10-CM | POA: Diagnosis not present

## 2019-08-21 DIAGNOSIS — C7800 Secondary malignant neoplasm of unspecified lung: Secondary | ICD-10-CM | POA: Diagnosis not present

## 2019-08-25 ENCOUNTER — Encounter: Payer: Self-pay | Admitting: Cardiology

## 2019-08-29 ENCOUNTER — Other Ambulatory Visit: Payer: Self-pay | Admitting: Cardiology

## 2019-09-04 ENCOUNTER — Ambulatory Visit: Payer: Medicare Other | Admitting: Cardiology

## 2019-09-18 ENCOUNTER — Other Ambulatory Visit: Payer: Self-pay | Admitting: Cardiology

## 2019-09-19 DIAGNOSIS — C7800 Secondary malignant neoplasm of unspecified lung: Secondary | ICD-10-CM | POA: Diagnosis not present

## 2019-09-19 DIAGNOSIS — R918 Other nonspecific abnormal finding of lung field: Secondary | ICD-10-CM | POA: Diagnosis not present

## 2019-09-19 DIAGNOSIS — C218 Malignant neoplasm of overlapping sites of rectum, anus and anal canal: Secondary | ICD-10-CM | POA: Diagnosis not present

## 2019-09-19 DIAGNOSIS — K4020 Bilateral inguinal hernia, without obstruction or gangrene, not specified as recurrent: Secondary | ICD-10-CM | POA: Diagnosis not present

## 2019-09-20 DIAGNOSIS — C218 Malignant neoplasm of overlapping sites of rectum, anus and anal canal: Secondary | ICD-10-CM

## 2019-09-20 DIAGNOSIS — C7800 Secondary malignant neoplasm of unspecified lung: Secondary | ICD-10-CM

## 2019-09-22 DIAGNOSIS — J309 Allergic rhinitis, unspecified: Secondary | ICD-10-CM | POA: Diagnosis not present

## 2019-09-22 DIAGNOSIS — I10 Essential (primary) hypertension: Secondary | ICD-10-CM | POA: Diagnosis not present

## 2019-09-22 DIAGNOSIS — E7800 Pure hypercholesterolemia, unspecified: Secondary | ICD-10-CM | POA: Diagnosis not present

## 2019-09-25 DIAGNOSIS — E7800 Pure hypercholesterolemia, unspecified: Secondary | ICD-10-CM | POA: Diagnosis not present

## 2019-09-25 DIAGNOSIS — Z23 Encounter for immunization: Secondary | ICD-10-CM | POA: Diagnosis not present

## 2019-09-25 DIAGNOSIS — C7800 Secondary malignant neoplasm of unspecified lung: Secondary | ICD-10-CM | POA: Diagnosis not present

## 2019-09-25 DIAGNOSIS — I10 Essential (primary) hypertension: Secondary | ICD-10-CM | POA: Diagnosis not present

## 2019-09-25 DIAGNOSIS — C20 Malignant neoplasm of rectum: Secondary | ICD-10-CM | POA: Diagnosis not present

## 2019-09-25 DIAGNOSIS — Z79899 Other long term (current) drug therapy: Secondary | ICD-10-CM | POA: Diagnosis not present

## 2019-09-25 DIAGNOSIS — E1165 Type 2 diabetes mellitus with hyperglycemia: Secondary | ICD-10-CM | POA: Diagnosis not present

## 2019-09-25 DIAGNOSIS — Z0000 Encounter for general adult medical examination without abnormal findings: Secondary | ICD-10-CM | POA: Diagnosis not present

## 2019-09-28 ENCOUNTER — Telehealth: Payer: Self-pay

## 2019-09-28 ENCOUNTER — Other Ambulatory Visit: Payer: Self-pay | Admitting: Cardiology

## 2019-09-28 NOTE — Telephone Encounter (Signed)
Hydralazine 25 mg TID refill sent to [facility]

## 2019-10-01 NOTE — Progress Notes (Signed)
Cardiology Office Note:    Date:  10/03/2019   ID:  Dakota Bennett, DOB [birth year], MRN [id number]  PCP:  Serita Grammes, MD  Cardiologist:  Shirlee More, MD    Referring MD: Serita Grammes, MD    ASSESSMENT:    1. Hypertensive heart disease, unspecified whether heart failure present   2. CAD in native artery   3. Hyperlipidemia LDL goal <70    PLAN:    In order of problems listed above:  1. Poorly controlled add minoxidil along with his hydralazine beta-blocker loop diuretic and and reassess if systolic remains greater than 150. 2. Stable CAD continue medical treatment including single antiplatelet agent clopidogrel high intensity statin Zetia and beta-blocker. 3. Stable lipids are ideal   Next appointment: 6 months   Medication Adjustments/Labs and Tests Ordered: Current medicines are reviewed at length with the patient today.  Concerns regarding medicines are outlined above.  No orders of the defined types were placed in this encounter.  No orders of the defined types were placed in this encounter.   Chief Complaint  Patient presents with  . Follow-up  . Coronary Artery Disease    History of Present Illness:    Dakota Bennett is a 73 y.o. male with a hx of CADwith MI and PCI of RCA 02/27/2019 last seen 05/25/2019. Compliance with diet, lifestyle and medications: Yes He has a history of stage IIIa colorectal cancer with metastasis to the lungs, HTN, HLD, DM2, GERD, chronic hyponatremia, and chronic thrombocytopenia. CAD with NSTEMI and PCI 02/27/2019 s/p stent to the RCA complicated with acute dissection requiring 3 drug-eluting stents for control. Residual proximal LAD disease 70% first marginal 50% of recommendation to treat medically unless he has refractory angina as he would require atherectomy. Ventricular function was normal ejection fraction 55 to 65%.  Presently he is having a hiatus from chemotherapy.  He is pleased with the quality of his life  and is having no exercise intolerance angina shortness of breath palpitation or syncope but bruises easily with his thrombocytopenia followed by hematology and despite multiple medications and compliance his blood pressure runs in the 1 [id number] systolic range.  He cannot take ACE inhibitor because of angioedema and is he is taking high-dose beta-blocker and loop diuretic I Minna start him on minoxidil and he will contact me in 2 weeks if systolics remain greater than 150. Past Medical History:  Diagnosis Date  . Allergic rhinitis   . Cancer ([place])    rectal cancer and found spots on his lungs also and is on chemo currently   . Diabetes mellitus without complication ([place])    type 2  . GERD (gastroesophageal reflux disease)   . Headache    hx of migraines yrs ago  . Hemorrhoids   . Hyperlipidemia   . Hypertension   . MVP (mitral valve prolapse)    dx 42 yrs no cardiologist  . Rectal bleeding     Past Surgical History:  Procedure Laterality Date  . COLONOSCOPY  03/03/2017   Proximal rectal mass (biopsied). Transverse colonic polyps status post polypectomy. Mild sigmoid diverticulosis  . CORONARY STENT INTERVENTION N/A 02/27/2019   Procedure: CORONARY STENT INTERVENTION;  Surgeon: Martinique, Peter M, MD;  Location: [place] [facility];  Service: Cardiovascular;  Laterality: N/A;  . EUS N/A 03/18/2017   Procedure: LOWER ENDOSCOPIC ULTRASOUND (EUS);  Surgeon: Milus Banister, MD;  Location: [facility];  Service: Endoscopy;  Laterality: N/A;  . extensive dental work    .  FLEXIBLE SIGMOIDOSCOPY N/A 02/21/2019   Procedure: FLEXIBLE SIGMOIDOSCOPY;  Surgeon: Jackquline Denmark, MD;  Location: [facility];  Service: Endoscopy;  Laterality: N/A;  . ingrown toenail removed  age 29 or 69  . LEFT HEART CATH AND CORONARY ANGIOGRAPHY N/A 02/27/2019   Procedure: LEFT HEART CATH AND CORONARY ANGIOGRAPHY;  Surgeon: Martinique, Peter M, MD;  Location: [place] [facility];  Service: Cardiovascular;  Laterality: N/A;     Current Medications: Current Meds  Medication Sig  . acetaminophen (TYLENOL) 500 MG tablet Take 1,000 mg by mouth every 6 (six) hours as needed for headache (pain).  Marland Kitchen atorvastatin (LIPITOR) 80 MG tablet Take 1 tablet (80 mg total) by mouth daily at 6 PM.  . clopidogrel (PLAVIX) 75 MG tablet Take 1 tablet (75 mg total) by mouth daily with breakfast.  . diphenoxylate-atropine (LOMOTIL) 2.5-0.025 MG tablet Take 1 tablet by mouth 4 (four) times daily as needed for diarrhea or loose stools.  . ezetimibe (ZETIA) 10 MG tablet Take 1 tablet (10 mg total) by mouth daily.  . famotidine (PEPCID) 20 MG tablet Take 1 tablet (20 mg total) by mouth as needed for heartburn or indigestion.  . Ferrous Sulfate (IRON PO) Take 1 tablet by mouth daily.  . furosemide (LASIX) 20 MG tablet Take 20 mg by mouth 2 (two) times daily. Takes 2 tablets daily on Mon, Wed, and Fri  . hydrALAZINE (APRESOLINE) 25 MG tablet TAKE 1 TABLET BY MOUTH THREE TIMES A DAY  . isosorbide mononitrate (IMDUR) 30 MG 24 hr tablet Take 1 tablet (30 mg total) by mouth daily.  Marland Kitchen KLOR-CON M10 10 MEQ tablet TAKE 2 TABLETS (20 MEQ TOTAL) BY MOUTH 3 (THREE) TIMES DAILY.  . metFORMIN (GLUCOPHAGE-XR) 500 MG 24 hr tablet Take 500 mg by mouth daily with breakfast.   . metoprolol succinate (TOPROL-XL) 100 MG 24 hr tablet Take 100 mg by mouth daily. Take with or immediately following a meal.  . nitroGLYCERIN (NITROSTAT) 0.4 MG SL tablet Place 1 tablet (0.4 mg total) under the tongue every 5 (five) minutes as needed for chest pain.  . pantoprazole (PROTONIX) 40 MG tablet Take 40 mg by mouth daily.  . prochlorperazine (COMPAZINE) 10 MG tablet Take 10 mg by mouth 3 (three) times daily as needed for nausea/vomiting.     Allergies:   Lisinopril, Adhesive [tape], Aspirin, and Neosporin [neomycin-bacitracin zn-polymyx]   Social History   Socioeconomic History  . Marital status: Married    Spouse name: Not on file  . Number of children: 3  . Years of  education: Not on file  . Highest education level: Not on file  Occupational History  . Not on file  Social Needs  . Financial resource strain: Not on file  . Food insecurity    Worry: Not on file    Inability: Not on file  . Transportation needs    Medical: Not on file    Non-medical: Not on file  Tobacco Use  . Smoking status: Former Smoker    Packs/day: 2.00    Years: 20.00    Pack years: 40.00    Types: Cigarettes  . Smokeless tobacco: Former Systems developer    Quit date: 11/23/1998  . Tobacco comment: quit 2000  Substance and Sexual Activity  . Alcohol use: No  . Drug use: No  . Sexual activity: Not on file  Lifestyle  . Physical activity    Days per week: Not on file    Minutes per session: Not on file  .  Stress: Not on file  Relationships  . Social Herbalist on phone: Not on file    Gets together: Not on file    Attends religious service: Not on file    Active member of club or organization: Not on file    Attends meetings of clubs or organizations: Not on file    Relationship status: Not on file  Other Topics Concern  . Not on file  Social History Narrative  . Not on file     Family History: The patient's family history includes Heart attack in his father; Prostate cancer in his father. There is no history of Colon cancer or Esophageal cancer. ROS:   Please see the history of present illness.    All other systems reviewed and are negative.  EKGs/Labs/Other Studies Reviewed:    The following studies were reviewed today:  EKG:  EKG ordered today and personally reviewed.  The ekg ordered today demonstrates sinus rhythm left bundle branch block  Recent Labs:  09/25/2019: Cholesterol 112 LDL 55 HDL 34 A1c 6.4% creatinine 0.90. 02/28/2019: BUN 12; Creatinine, Ser 0.90; Hemoglobin 11.1; Magnesium 1.8; Platelets 73; Potassium 4.2; Sodium 135  Recent Lipid Panel    Component Value Date/Time   CHOL 150 02/26/2019 0348   TRIG 88 02/26/2019 0348   HDL 35 (L)  02/26/2019 0348   CHOLHDL 4.3 02/26/2019 0348   VLDL 18 02/26/2019 0348   LDLCALC 97 02/26/2019 0348    Physical Exam:    VS:  BP (!) 180/72 (BP Location: Left Arm, Patient Position: Sitting, Cuff Size: Normal)   Pulse 76   Ht 5' 10.5" (1.791 m)   Wt 190 lb 6.4 oz (86.4 kg)   SpO2 98%   BMI 26.93 kg/m     Wt Readings from Last 3 Encounters:  10/03/19 190 lb 6.4 oz (86.4 kg)  05/25/19 176 lb (79.8 kg)  04/20/19 181 lb (82.1 kg)     GEN:  Well nourished, well developed in no acute distress HEENT: Normal NECK: No JVD; No carotid bruits LYMPHATICS: No lymphadenopathy CARDIAC: RRR, no murmurs, rubs, gallops RESPIRATORY:  Clear to auscultation without rales, wheezing or rhonchi  ABDOMEN: Soft, non-tender, non-distended MUSCULOSKELETAL:  No edema; No deformity  SKIN: Warm and dry NEUROLOGIC:  Alert and oriented x 3 PSYCHIATRIC:  Normal affect    Signed, Shirlee More, MD  10/03/2019 3:57 PM    [place]

## 2019-10-03 ENCOUNTER — Ambulatory Visit (INDEPENDENT_AMBULATORY_CARE_PROVIDER_SITE_OTHER): Payer: Medicare Other | Admitting: Cardiology

## 2019-10-03 ENCOUNTER — Other Ambulatory Visit: Payer: Self-pay

## 2019-10-03 VITALS — BP 180/72 | HR 76 | Ht 70.5 in | Wt 190.4 lb

## 2019-10-03 DIAGNOSIS — E785 Hyperlipidemia, unspecified: Secondary | ICD-10-CM

## 2019-10-03 DIAGNOSIS — I119 Hypertensive heart disease without heart failure: Secondary | ICD-10-CM

## 2019-10-03 DIAGNOSIS — I2510 Atherosclerotic heart disease of native coronary artery without angina pectoris: Secondary | ICD-10-CM

## 2019-10-03 MED ORDER — MINOXIDIL 2.5 MG PO TABS: 2.5000 mg | ORAL_TABLET | Freq: Two times a day (BID) | ORAL | 3 refills | Status: DC

## 2019-10-03 NOTE — Patient Instructions (Addendum)
Medication Instructions:  Your physician has recommended you make the following change in your medication:  START minoxidil (loniten) 2.5 mg: Take 1 tablet twice daily  **Check your blood pressure daily at the same time every day and record these readings. If your systolic BP (top number) is greater than 150 after 2 weeks, call our office.   *If you need a refill on your cardiac medications before your next appointment, please call your pharmacy*  Lab Work: None  If you have labs (blood work) drawn today and your tests are completely normal, you will receive your results only by: Marland Kitchen MyChart Message (if you have MyChart) OR . A paper copy in the mail If you have any lab test that is abnormal or we need to change your treatment, we will call you to review the results.  Testing/Procedures: You had an EKG today.   Follow-Up: At [facility], you and your health needs are our priority.  As part of our continuing mission to provide you with exceptional heart care, we have created designated Provider Care Teams.  These Care Teams include your primary Cardiologist (physician) and Advanced Practice Providers (APPs -  Physician Assistants and Nurse Practitioners) who all work together to provide you with the care you need, when you need it.  Your next appointment:   6 months  The format for your next appointment:   In Person  Provider:   Shirlee More, MD   Minoxidil tablets What is this medicine? MINOXIDIL (mi NOX i dill) is a type of vasodilator. It relaxes blood vessels, increasing the blood and oxygen supply to your heart. This medicine is used to treat high blood pressure. It is not recommended for use in mild cases of high blood pressure because of the potential for serious side effects. This medicine may be used for other purposes; ask your health care provider or pharmacist if you have questions. COMMON BRAND NAME(S): Loniten What should I tell my health care provider before I  take this medicine? They need to know if you have any of these conditions:  angina  heart or blood vessel disease  kidney disease  lung disease  pheochromocytoma  previous heart attack  an unusual or allergic reaction to minoxidil, other medicines, foods, dyes, or preservatives  pregnant or trying to get pregnant  breast-feeding How should I use this medicine? Take this medicine by mouth with a glass of water. Follow the directions on the prescription label. Take your doses at regular intervals. Do not take your medicine more often than directed. Do not stop taking except on the advice of your doctor or health care professional. Talk to your pediatrician regarding the use of this medicine in children. Special care may be needed. While this medicine may be prescribed for children for selected conditions, precautions do apply. Overdosage: If you think you have taken too much of this medicine contact a poison control center or emergency room at once. NOTE: This medicine is only for you. Do not share this medicine with others. What if I miss a dose? If you miss a dose, take it as soon as you can. If it is almost time for your next dose, take only that dose. Do not take double or extra doses. What may interact with this medicine?  medicines for chest pain  medicines for high blood pressure This list may not describe all possible interactions. Give your health care provider a list of all the medicines, herbs, non-prescription drugs, or dietary supplements you  use. Also tell them if you smoke, drink alcohol, or use illegal drugs. Some items may interact with your medicine. What should I watch for while using this medicine? Check your heart rate and blood pressure regularly while you are taking this medicine. Ask your doctor or health care professional what your heart rate and blood pressure should be and when you should contact him or her. While you are taking this medicine, keep a check  on your weight. Tell your doctor or health care professional if you rapidly gain more then 5 pounds. You may get dizzy. Do not drive, use machinery, or do anything that needs mental alertness until you know how this medicine affects you. To reduce the risk of dizzy or fainting spells, do not sit or stand up quickly, especially if you are an older patient. Alcohol can make you more dizzy, and increase flushing and rapid heartbeats. Avoid alcoholic drinks. Your mouth may get dry. Chewing sugarless gum or sucking hard candy, and drinking plenty of water may help. Contact your doctor if the problem does not go away or is severe. Do not treat yourself for coughs, colds, or pain while you are taking this medicine without asking your doctor or health care professional for advice. Some ingredients may increase your blood pressure. You may need to follow a special low sodium diet while taking this medicine. Check with your doctor or health care professional. What side effects may I notice from receiving this medicine? Side effects that you should report to your doctor or health care professional as soon as possible:  chest pain, fast or irregular heartbeat, palpitations  difficulty breathing  dizziness or fainting spells  redness, blistering, peeling or loosening of the skin, including inside the mouth  skin rash or itching  stiff or swollen joints  sudden weight gain  swelling of the feet or legs  unusual weakness Side effects that usually do not require medical attention (report to your doctor or health care professional if they continue or are bothersome):  headache  unusual hair growth, on the face, arm, and back This list may not describe all possible side effects. Call your doctor for medical advice about side effects. You may report side effects to FDA at 1-800-FDA-1088. Where should I keep my medicine? Keep out of the reach of children. Store at room temperature between 20 and 25  degrees C (68 and 77 degrees F). Throw away any unused medicine after the expiration date. NOTE: This sheet is a summary. It may not cover all possible information. If you have questions about this medicine, talk to your doctor, pharmacist, or health care provider.  2020 Elsevier/Gold Standard (2008-05-28 14:24:22)

## 2019-10-16 ENCOUNTER — Other Ambulatory Visit: Payer: Self-pay | Admitting: Cardiology

## 2019-10-18 ENCOUNTER — Telehealth: Payer: Self-pay | Admitting: Cardiology

## 2019-10-18 MED ORDER — MINOXIDIL 2.5 MG PO TABS: 2.5000 mg | ORAL_TABLET | Freq: Three times a day (TID) | ORAL | 3 refills | Status: DC

## 2019-10-18 NOTE — Telephone Encounter (Signed)
Patient states that his systolic blood pressure has been running 147-173 over the past two weeks since Dr Bettina Gavia started minoxidil 2.5mg  one tablet twice daily at last office visit.   Dr Bettina Gavia reviewed list of blood pressure readings for the past two weeks and gave the verbal order to increase minoxidil 2.5 mg to three times daily.  Patient will continue to monitor blood pressure at home and call our office in 1 week with readings.  Patient agreed to plan and verbalized understanding.  No further questions.

## 2019-10-18 NOTE — Telephone Encounter (Signed)
Patient was told to call back if his top BP number was over 150 and it has been for several days now. Please advise.

## 2019-10-26 ENCOUNTER — Telehealth: Payer: Self-pay | Admitting: Cardiology

## 2019-10-26 NOTE — Telephone Encounter (Signed)
Patient called to discuss BP.. please call him.

## 2019-10-27 MED ORDER — MINOXIDIL 2.5 MG PO TABS: 5.0000 mg | ORAL_TABLET | Freq: Two times a day (BID) | ORAL | 1 refills | Status: DC

## 2019-10-27 NOTE — Addendum Note (Signed)
Addended by: Austin Miles on: 10/27/2019 04:37 PM   Modules accepted: Orders

## 2019-10-27 NOTE — Telephone Encounter (Signed)
Patient called to report recent BP readings after increasing minoxidil 2.5 mg from twice daily to three times daily on 10/18/2019. Patient's BP readings are as follows:   10/20/2019: 161/68 10/22/2019: 153/67 10/23/2019: 158/66 10/24/2019: 156/73 10/25/2019: 169/71 10/26/2019: 149/67  Please advise of further recommendations. Thanks!

## 2019-10-27 NOTE — Telephone Encounter (Signed)
Lets have him change minoxidil to 2 with a 2.5 equal 5 mg twice daily if he needs a refill he is the 5 mg tablets and again in 2 weeks let us know if systolics remain greater than 140

## 2019-10-27 NOTE — Telephone Encounter (Signed)
Patient informed to increase minoxidil from 2.5 mg three times daily to 5 mg twice daily. Patient is agreeable and will continue to monitor his blood pressure and keep a log of these readings. He will call our office if his systolic BP (top number) consistently remains above 140 for further advisement. Patient verbalized understanding. No further questions.

## 2019-11-01 DIAGNOSIS — C218 Malignant neoplasm of overlapping sites of rectum, anus and anal canal: Secondary | ICD-10-CM | POA: Diagnosis not present

## 2019-11-01 DIAGNOSIS — C7800 Secondary malignant neoplasm of unspecified lung: Secondary | ICD-10-CM | POA: Diagnosis not present

## 2019-11-23 ENCOUNTER — Other Ambulatory Visit: Payer: Self-pay | Admitting: Cardiology

## 2019-12-12 DIAGNOSIS — C7800 Secondary malignant neoplasm of unspecified lung: Secondary | ICD-10-CM | POA: Diagnosis not present

## 2019-12-12 DIAGNOSIS — K5730 Diverticulosis of large intestine without perforation or abscess without bleeding: Secondary | ICD-10-CM | POA: Diagnosis not present

## 2019-12-12 DIAGNOSIS — C218 Malignant neoplasm of overlapping sites of rectum, anus and anal canal: Secondary | ICD-10-CM | POA: Diagnosis not present

## 2019-12-13 DIAGNOSIS — C218 Malignant neoplasm of overlapping sites of rectum, anus and anal canal: Secondary | ICD-10-CM | POA: Diagnosis not present

## 2019-12-13 DIAGNOSIS — C7800 Secondary malignant neoplasm of unspecified lung: Secondary | ICD-10-CM | POA: Diagnosis not present

## 2019-12-19 ENCOUNTER — Other Ambulatory Visit: Payer: Self-pay | Admitting: *Deleted

## 2019-12-19 MED ORDER — MINOXIDIL 2.5 MG PO TABS: 5.0000 mg | ORAL_TABLET | Freq: Two times a day (BID) | ORAL | 5 refills | Status: DC

## 2019-12-28 ENCOUNTER — Other Ambulatory Visit: Payer: Self-pay | Admitting: Cardiology

## 2020-01-12 DIAGNOSIS — C7800 Secondary malignant neoplasm of unspecified lung: Secondary | ICD-10-CM | POA: Diagnosis not present

## 2020-01-12 DIAGNOSIS — C218 Malignant neoplasm of overlapping sites of rectum, anus and anal canal: Secondary | ICD-10-CM | POA: Diagnosis not present

## 2020-01-19 DIAGNOSIS — Z23 Encounter for immunization: Secondary | ICD-10-CM | POA: Diagnosis not present

## 2020-01-28 ENCOUNTER — Other Ambulatory Visit: Payer: Self-pay | Admitting: Cardiology

## 2020-02-08 DIAGNOSIS — C218 Malignant neoplasm of overlapping sites of rectum, anus and anal canal: Secondary | ICD-10-CM | POA: Diagnosis not present

## 2020-02-08 DIAGNOSIS — C7800 Secondary malignant neoplasm of unspecified lung: Secondary | ICD-10-CM | POA: Diagnosis not present

## 2020-02-16 DIAGNOSIS — Z23 Encounter for immunization: Secondary | ICD-10-CM | POA: Diagnosis not present

## 2020-03-12 DIAGNOSIS — C7802 Secondary malignant neoplasm of left lung: Secondary | ICD-10-CM | POA: Diagnosis not present

## 2020-03-12 DIAGNOSIS — C7801 Secondary malignant neoplasm of right lung: Secondary | ICD-10-CM | POA: Diagnosis not present

## 2020-03-12 DIAGNOSIS — C785 Secondary malignant neoplasm of large intestine and rectum: Secondary | ICD-10-CM | POA: Diagnosis not present

## 2020-03-12 DIAGNOSIS — C801 Malignant (primary) neoplasm, unspecified: Secondary | ICD-10-CM | POA: Diagnosis not present

## 2020-03-12 DIAGNOSIS — C218 Malignant neoplasm of overlapping sites of rectum, anus and anal canal: Secondary | ICD-10-CM | POA: Diagnosis not present

## 2020-03-12 DIAGNOSIS — C7800 Secondary malignant neoplasm of unspecified lung: Secondary | ICD-10-CM | POA: Diagnosis not present

## 2020-03-14 DIAGNOSIS — I2510 Atherosclerotic heart disease of native coronary artery without angina pectoris: Secondary | ICD-10-CM | POA: Diagnosis not present

## 2020-03-14 DIAGNOSIS — I252 Old myocardial infarction: Secondary | ICD-10-CM | POA: Diagnosis not present

## 2020-03-14 DIAGNOSIS — C218 Malignant neoplasm of overlapping sites of rectum, anus and anal canal: Secondary | ICD-10-CM | POA: Diagnosis not present

## 2020-03-14 DIAGNOSIS — D696 Thrombocytopenia, unspecified: Secondary | ICD-10-CM | POA: Diagnosis not present

## 2020-03-14 DIAGNOSIS — D649 Anemia, unspecified: Secondary | ICD-10-CM | POA: Diagnosis not present

## 2020-03-14 DIAGNOSIS — C7800 Secondary malignant neoplasm of unspecified lung: Secondary | ICD-10-CM | POA: Diagnosis not present

## 2020-03-14 DIAGNOSIS — R161 Splenomegaly, not elsewhere classified: Secondary | ICD-10-CM | POA: Diagnosis not present

## 2020-03-14 DIAGNOSIS — G629 Polyneuropathy, unspecified: Secondary | ICD-10-CM | POA: Diagnosis not present

## 2020-03-20 ENCOUNTER — Other Ambulatory Visit: Payer: Self-pay | Admitting: Cardiology

## 2020-03-25 DIAGNOSIS — C7800 Secondary malignant neoplasm of unspecified lung: Secondary | ICD-10-CM | POA: Diagnosis not present

## 2020-03-25 DIAGNOSIS — C20 Malignant neoplasm of rectum: Secondary | ICD-10-CM | POA: Diagnosis not present

## 2020-03-25 DIAGNOSIS — I10 Essential (primary) hypertension: Secondary | ICD-10-CM | POA: Diagnosis not present

## 2020-03-25 DIAGNOSIS — E1165 Type 2 diabetes mellitus with hyperglycemia: Secondary | ICD-10-CM | POA: Diagnosis not present

## 2020-04-20 ENCOUNTER — Other Ambulatory Visit: Payer: Self-pay | Admitting: Cardiology

## 2020-04-21 ENCOUNTER — Other Ambulatory Visit: Payer: Self-pay | Admitting: Cardiology

## 2020-04-25 DIAGNOSIS — D696 Thrombocytopenia, unspecified: Secondary | ICD-10-CM | POA: Diagnosis not present

## 2020-04-25 DIAGNOSIS — C218 Malignant neoplasm of overlapping sites of rectum, anus and anal canal: Secondary | ICD-10-CM | POA: Diagnosis not present

## 2020-04-25 DIAGNOSIS — E876 Hypokalemia: Secondary | ICD-10-CM | POA: Diagnosis not present

## 2020-04-25 DIAGNOSIS — R945 Abnormal results of liver function studies: Secondary | ICD-10-CM | POA: Diagnosis not present

## 2020-04-25 DIAGNOSIS — D649 Anemia, unspecified: Secondary | ICD-10-CM | POA: Diagnosis not present

## 2020-04-25 DIAGNOSIS — I252 Old myocardial infarction: Secondary | ICD-10-CM | POA: Diagnosis not present

## 2020-04-25 DIAGNOSIS — R970 Elevated carcinoembryonic antigen [CEA]: Secondary | ICD-10-CM | POA: Diagnosis not present

## 2020-04-25 DIAGNOSIS — B179 Acute viral hepatitis, unspecified: Secondary | ICD-10-CM | POA: Diagnosis not present

## 2020-04-25 DIAGNOSIS — G629 Polyneuropathy, unspecified: Secondary | ICD-10-CM | POA: Diagnosis not present

## 2020-04-25 DIAGNOSIS — C7800 Secondary malignant neoplasm of unspecified lung: Secondary | ICD-10-CM | POA: Diagnosis not present

## 2020-04-25 DIAGNOSIS — I2510 Atherosclerotic heart disease of native coronary artery without angina pectoris: Secondary | ICD-10-CM | POA: Diagnosis not present

## 2020-05-01 DIAGNOSIS — C218 Malignant neoplasm of overlapping sites of rectum, anus and anal canal: Secondary | ICD-10-CM | POA: Diagnosis not present

## 2020-05-01 DIAGNOSIS — R918 Other nonspecific abnormal finding of lung field: Secondary | ICD-10-CM | POA: Diagnosis not present

## 2020-05-01 DIAGNOSIS — C189 Malignant neoplasm of colon, unspecified: Secondary | ICD-10-CM | POA: Diagnosis not present

## 2020-05-01 DIAGNOSIS — C7800 Secondary malignant neoplasm of unspecified lung: Secondary | ICD-10-CM | POA: Diagnosis not present

## 2020-05-02 DIAGNOSIS — G629 Polyneuropathy, unspecified: Secondary | ICD-10-CM | POA: Diagnosis not present

## 2020-05-02 DIAGNOSIS — E876 Hypokalemia: Secondary | ICD-10-CM | POA: Diagnosis not present

## 2020-05-02 DIAGNOSIS — R945 Abnormal results of liver function studies: Secondary | ICD-10-CM | POA: Diagnosis not present

## 2020-05-02 DIAGNOSIS — C218 Malignant neoplasm of overlapping sites of rectum, anus and anal canal: Secondary | ICD-10-CM

## 2020-05-02 DIAGNOSIS — C7800 Secondary malignant neoplasm of unspecified lung: Secondary | ICD-10-CM

## 2020-05-14 NOTE — Progress Notes (Signed)
Cardiology Office Note:    Date:  05/15/2020   ID:  Dakota Bennett, DOB 12/29/1945, MRN [id number]  PCP:  Serita Grammes, MD  Cardiologist:  Shirlee More, MD    Referring MD: Serita Grammes, MD    ASSESSMENT:    1. CAD in native artery   2. Hypertensive heart disease, unspecified whether heart failure present   3. Hyperlipidemia LDL goal <70   4. Malignant neoplasm metastatic to lung, unspecified laterality ([place])   5. History of rectal cancer    PLAN:    In order of problems listed above:  1. Stable CAD rare exertional angina continue medical therapy class II including clopidogrel high intensity statin blocker renew his nitroglycerin at this time I do not think he needs an ischemia evaluation 2. Stable continue treatment including diuretic hydralazine beta-blocker BP at target 3. Continue statin offered to recheck today but he tells me he thinks he had it done recently at his PCP office notes not available in the [facility] print out 4. I asked him to have an echocardiogram performed with his diminished endurance especially contemplating restarting chemotherapy   Next appointment: 6 months   Medication Adjustments/Labs and Tests Ordered: Current medicines are reviewed at length with the patient today.  Concerns regarding medicines are outlined above.  No orders of the defined types were placed in this encounter.  No orders of the defined types were placed in this encounter.   Chief Complaint  Patient presents with  . Follow-up    6 MO FU   . Coronary Artery Disease    History of Present Illness:    Dakota Bennett is a 74 y.o. male with a hx of  CAD with MI and PCI of RCA 02/27/2019 last seen 10/03/2019 with poorly controlled hypertension initiating minoxidil.  He has a history of stage IIIa colorectal cancer with metastasis to the lungs, HTN, HLD, DM2, GERD, chronic hyponatremia, and chronic thrombocytopenia. CAD with NSTEMI and PCI 02/27/2019 s/p stent to the  RCA complicated with acute dissection requiring 3 drug-eluting stents for control. Residual proximal LAD disease 70% first marginal 50% of recommendation to treat medically unless he has refractory angina as he would require atherectomy. Ventricular function was normal ejection fraction 55 to 65%.    Oncology records from 04/25/2020 reviewed he has had mild progression of metastatic disease to the lung and is on palliative infusional chemotherapy other problems include persistent thrombocytopenia and abnormal liver function test raising concern for metastatic disease to the liver.  Recent platelet count 04/25/2020 96,000 hemoglobin 11.7 potassium 3.1 creatinine 0.9 GFR greater than 60 cc an abnormal liver function test with elevated AST ALT and alkaline phosphatase.  Compliance with diet, lifestyle and medications: Yes  He is presently on hiatus from treatment for his malignancy and discussing further treatment.  He is noticed his endurance is diminished but he is able to do the activities he did he is retired he has rare exertional angina stable pattern relieved with rest and nitroglycerin perhaps once to twice a month.  No edema shortness of breath palpitation or syncope. Past Medical History:  Diagnosis Date  . Allergic rhinitis   . Cancer ([place])    rectal cancer and found spots on his lungs also and is on chemo currently   . Diabetes mellitus without complication ([place])    type 2  . GERD (gastroesophageal reflux disease)   . Headache    hx of migraines yrs ago  . Hemorrhoids   . Hyperlipidemia   .  Hypertension   . MVP (mitral valve prolapse)    dx 42 yrs no cardiologist  . Rectal bleeding     Past Surgical History:  Procedure Laterality Date  . COLONOSCOPY  03/03/2017   Proximal rectal mass (biopsied). Transverse colonic polyps status post polypectomy. Mild sigmoid diverticulosis  . CORONARY STENT INTERVENTION N/A 02/27/2019   Procedure: CORONARY STENT INTERVENTION;  Surgeon: Martinique,  Peter M, MD;  Location: [facility];  Service: Cardiovascular;  Laterality: N/A;  . EUS N/A 03/18/2017   Procedure: LOWER ENDOSCOPIC ULTRASOUND (EUS);  Surgeon: Milus Banister, MD;  Location: [facility];  Service: Endoscopy;  Laterality: N/A;  . extensive dental work    . FLEXIBLE SIGMOIDOSCOPY N/A 02/21/2019   Procedure: FLEXIBLE SIGMOIDOSCOPY;  Surgeon: Jackquline Denmark, MD;  Location: [facility];  Service: Endoscopy;  Laterality: N/A;  . ingrown toenail removed  age 59 or 89  . LEFT HEART CATH AND CORONARY ANGIOGRAPHY N/A 02/27/2019   Procedure: LEFT HEART CATH AND CORONARY ANGIOGRAPHY;  Surgeon: Martinique, Peter M, MD;  Location: [place] [facility];  Service: Cardiovascular;  Laterality: N/A;    Current Medications: Current Meds  Medication Sig  . atorvastatin (LIPITOR) 80 MG tablet Take 1 tablet (80 mg total) by mouth daily at 6 PM.  . clopidogrel (PLAVIX) 75 MG tablet Take 1 tablet (75 mg total) by mouth daily with breakfast.  . diphenoxylate-atropine (LOMOTIL) 2.5-0.025 MG tablet Take 1 tablet by mouth 4 (four) times daily as needed for diarrhea or loose stools.  . ezetimibe (ZETIA) 10 MG tablet TAKE 1 TABLET BY MOUTH EVERY DAY  . famotidine (PEPCID) 20 MG tablet Take 1 tablet (20 mg total) by mouth as needed for heartburn or indigestion.  . Ferrous Sulfate (IRON PO) Take 1 tablet by mouth daily.  . furosemide (LASIX) 20 MG tablet TAKE 1 TAB DAILY. IF NO WEIGHT LOSS OF 3 OR MORE POUNDS AFTER 2 DAYS, INCREASE TO 2 TAB DAILY.  . hydrALAZINE (APRESOLINE) 25 MG tablet TAKE 1 TABLET BY MOUTH THREE TIMES A DAY  . isosorbide mononitrate (IMDUR) 30 MG 24 hr tablet Take 1 tablet (30 mg total) by mouth daily.  Marland Kitchen KLOR-CON M10 10 MEQ tablet TAKE 2 TABLETS (20 MEQ TOTAL) BY MOUTH 3 (THREE) TIMES DAILY.  . metFORMIN (GLUCOPHAGE-XR) 500 MG 24 hr tablet Take 500 mg by mouth daily with breakfast.   . metoprolol succinate (TOPROL-XL) 100 MG 24 hr tablet Take 100 mg by mouth daily. Take with or  immediately following a meal.  . minoxidil (LONITEN) 2.5 MG tablet Take 2 tablets (5 mg total) by mouth 2 (two) times daily.  . nitroGLYCERIN (NITROSTAT) 0.4 MG SL tablet Place 1 tablet (0.4 mg total) under the tongue every 5 (five) minutes as needed for chest pain.  . pantoprazole (PROTONIX) 40 MG tablet Take 40 mg by mouth daily.  . prochlorperazine (COMPAZINE) 10 MG tablet Take 10 mg by mouth 3 (three) times daily as needed for nausea/vomiting.     Allergies:   Lisinopril, Adhesive [tape], Aspirin, and Neosporin [neomycin-bacitracin zn-polymyx]   Social History   Socioeconomic History  . Marital status: Married    Spouse name: Not on file  . Number of children: 3  . Years of education: Not on file  . Highest education level: Not on file  Occupational History  . Not on file  Tobacco Use  . Smoking status: Former Smoker    Packs/day: 2.00    Years: 20.00    Pack years:  40.00    Types: Cigarettes  . Smokeless tobacco: Former Systems developer    Quit date: 11/23/1998  . Tobacco comment: quit 2000  Vaping Use  . Vaping Use: Never used  Substance and Sexual Activity  . Alcohol use: No  . Drug use: No  . Sexual activity: Not on file  Other Topics Concern  . Not on file  Social History Narrative  . Not on file   Social Determinants of Health   Financial Resource Strain:   . Difficulty of Paying Living Expenses:   Food Insecurity:   . Worried About Charity fundraiser in the Last Year:   . Arboriculturist in the Last Year:   Transportation Needs:   . Film/video editor (Medical):   Marland Kitchen Lack of Transportation (Non-Medical):   Physical Activity:   . Days of Exercise per Week:   . Minutes of Exercise per Session:   Stress:   . Feeling of Stress :   Social Connections:   . Frequency of Communication with Friends and Family:   . Frequency of Social Gatherings with Friends and Family:   . Attends Religious Services:   . Active Member of Clubs or Organizations:   . Attends Theatre manager Meetings:   Marland Kitchen Marital Status:      Family History: The patient's family history includes Heart attack in his father; Prostate cancer in his father. There is no history of Colon cancer or Esophageal cancer. ROS:   Please see the history of present illness.    All other systems reviewed and are negative.  EKGs/Labs/Other Studies Reviewed:    The following studies were reviewed today:  EKG: He had an EKG last visit that was stable I did not repeat today  Recent Labs: See history Recent Lipid Panel    Component Value Date/Time   CHOL 150 02/26/2019 0348   TRIG 88 02/26/2019 0348   HDL 35 (L) 02/26/2019 0348   CHOLHDL 4.3 02/26/2019 0348   VLDL 18 02/26/2019 0348   LDLCALC 97 02/26/2019 0348    Physical Exam:    VS:  BP (!) 150/58 (BP Location: Left Arm, Patient Position: Sitting, Cuff Size: Normal)   Pulse 75   Ht 5' 10.5" (1.791 m)   Wt 189 lb 12.8 oz (86.1 kg)   SpO2 94%   BMI 26.85 kg/m     Wt Readings from Last 3 Encounters:  05/15/20 189 lb 12.8 oz (86.1 kg)  10/03/19 190 lb 6.4 oz (86.4 kg)  05/25/19 176 lb (79.8 kg)    Repeat 132/76 GEN:  Well nourished, well developed in no acute distress HEENT: Normal NECK: No JVD; No carotid bruits LYMPHATICS: No lymphadenopathy CARDIAC: RRR, no murmurs, rubs, gallops RESPIRATORY:  Clear to auscultation without rales, wheezing or rhonchi  ABDOMEN: Soft, non-tender, non-distended MUSCULOSKELETAL:  No edema; No deformity  SKIN: Warm and dry NEUROLOGIC:  Alert and oriented x 3 PSYCHIATRIC:  Normal affect    Signed, Shirlee More, MD  05/15/2020 8:36 AM    [place]

## 2020-05-15 ENCOUNTER — Encounter: Payer: Self-pay | Admitting: Cardiology

## 2020-05-15 ENCOUNTER — Ambulatory Visit (INDEPENDENT_AMBULATORY_CARE_PROVIDER_SITE_OTHER): Payer: Medicare Other | Admitting: Cardiology

## 2020-05-15 ENCOUNTER — Other Ambulatory Visit: Payer: Self-pay

## 2020-05-15 VITALS — BP 150/58 | HR 75 | Ht 70.5 in | Wt 189.8 lb

## 2020-05-15 DIAGNOSIS — I2510 Atherosclerotic heart disease of native coronary artery without angina pectoris: Secondary | ICD-10-CM | POA: Diagnosis not present

## 2020-05-15 DIAGNOSIS — E785 Hyperlipidemia, unspecified: Secondary | ICD-10-CM

## 2020-05-15 DIAGNOSIS — C7800 Secondary malignant neoplasm of unspecified lung: Secondary | ICD-10-CM | POA: Diagnosis not present

## 2020-05-15 DIAGNOSIS — I119 Hypertensive heart disease without heart failure: Secondary | ICD-10-CM

## 2020-05-15 DIAGNOSIS — Z85048 Personal history of other malignant neoplasm of rectum, rectosigmoid junction, and anus: Secondary | ICD-10-CM

## 2020-05-15 MED ORDER — NITROGLYCERIN 0.4 MG SL SUBL: 0.4000 mg | SUBLINGUAL_TABLET | SUBLINGUAL | 3 refills | Status: DC | PRN

## 2020-05-15 NOTE — Patient Instructions (Signed)

## 2020-05-28 ENCOUNTER — Ambulatory Visit (INDEPENDENT_AMBULATORY_CARE_PROVIDER_SITE_OTHER): Payer: Medicare Other

## 2020-05-28 ENCOUNTER — Other Ambulatory Visit: Payer: Self-pay

## 2020-05-28 DIAGNOSIS — I119 Hypertensive heart disease without heart failure: Secondary | ICD-10-CM

## 2020-05-28 DIAGNOSIS — I2510 Atherosclerotic heart disease of native coronary artery without angina pectoris: Secondary | ICD-10-CM

## 2020-05-28 NOTE — Progress Notes (Signed)
Complete echocardiogram performed.  Jimmy Ezekial Arns RDCS, RVT  

## 2020-05-29 ENCOUNTER — Telehealth: Payer: Self-pay

## 2020-05-29 NOTE — Telephone Encounter (Signed)
Spoke with patient regarding results and recommendation.  Patient verbalizes understanding and is agreeable to plan of care. Advised patient to call back with any issues or concerns.  

## 2020-06-03 DIAGNOSIS — R945 Abnormal results of liver function studies: Secondary | ICD-10-CM | POA: Diagnosis not present

## 2020-06-03 DIAGNOSIS — C7800 Secondary malignant neoplasm of unspecified lung: Secondary | ICD-10-CM | POA: Diagnosis not present

## 2020-06-03 DIAGNOSIS — R161 Splenomegaly, not elsewhere classified: Secondary | ICD-10-CM | POA: Diagnosis not present

## 2020-06-03 DIAGNOSIS — G629 Polyneuropathy, unspecified: Secondary | ICD-10-CM | POA: Diagnosis not present

## 2020-06-03 DIAGNOSIS — R970 Elevated carcinoembryonic antigen [CEA]: Secondary | ICD-10-CM | POA: Diagnosis not present

## 2020-06-03 DIAGNOSIS — D696 Thrombocytopenia, unspecified: Secondary | ICD-10-CM | POA: Diagnosis not present

## 2020-06-03 DIAGNOSIS — C218 Malignant neoplasm of overlapping sites of rectum, anus and anal canal: Secondary | ICD-10-CM | POA: Diagnosis not present

## 2020-06-26 DIAGNOSIS — C20 Malignant neoplasm of rectum: Secondary | ICD-10-CM | POA: Diagnosis not present

## 2020-06-26 DIAGNOSIS — E1165 Type 2 diabetes mellitus with hyperglycemia: Secondary | ICD-10-CM | POA: Diagnosis not present

## 2020-06-26 DIAGNOSIS — D61818 Other pancytopenia: Secondary | ICD-10-CM | POA: Diagnosis not present

## 2020-06-26 DIAGNOSIS — C7800 Secondary malignant neoplasm of unspecified lung: Secondary | ICD-10-CM | POA: Diagnosis not present

## 2020-07-04 DIAGNOSIS — C218 Malignant neoplasm of overlapping sites of rectum, anus and anal canal: Secondary | ICD-10-CM | POA: Diagnosis not present

## 2020-07-04 DIAGNOSIS — C7800 Secondary malignant neoplasm of unspecified lung: Secondary | ICD-10-CM | POA: Diagnosis not present

## 2020-07-07 ENCOUNTER — Other Ambulatory Visit: Payer: Self-pay | Admitting: Cardiology

## 2020-08-07 DIAGNOSIS — C7800 Secondary malignant neoplasm of unspecified lung: Secondary | ICD-10-CM | POA: Diagnosis not present

## 2020-08-07 DIAGNOSIS — C218 Malignant neoplasm of overlapping sites of rectum, anus and anal canal: Secondary | ICD-10-CM | POA: Diagnosis not present

## 2020-09-06 DIAGNOSIS — R911 Solitary pulmonary nodule: Secondary | ICD-10-CM | POA: Diagnosis not present

## 2020-09-06 DIAGNOSIS — I700 Atherosclerosis of aorta: Secondary | ICD-10-CM | POA: Diagnosis not present

## 2020-09-06 DIAGNOSIS — N261 Atrophy of kidney (terminal): Secondary | ICD-10-CM | POA: Diagnosis not present

## 2020-09-06 DIAGNOSIS — C218 Malignant neoplasm of overlapping sites of rectum, anus and anal canal: Secondary | ICD-10-CM | POA: Diagnosis not present

## 2020-09-06 DIAGNOSIS — C189 Malignant neoplasm of colon, unspecified: Secondary | ICD-10-CM | POA: Diagnosis not present

## 2020-09-06 DIAGNOSIS — C7800 Secondary malignant neoplasm of unspecified lung: Secondary | ICD-10-CM | POA: Diagnosis not present

## 2020-09-06 DIAGNOSIS — K4090 Unilateral inguinal hernia, without obstruction or gangrene, not specified as recurrent: Secondary | ICD-10-CM | POA: Diagnosis not present

## 2020-09-06 DIAGNOSIS — C7801 Secondary malignant neoplasm of right lung: Secondary | ICD-10-CM | POA: Diagnosis not present

## 2020-09-09 DIAGNOSIS — C7800 Secondary malignant neoplasm of unspecified lung: Secondary | ICD-10-CM | POA: Diagnosis not present

## 2020-09-09 DIAGNOSIS — C218 Malignant neoplasm of overlapping sites of rectum, anus and anal canal: Secondary | ICD-10-CM | POA: Diagnosis not present

## 2020-09-26 DIAGNOSIS — Z0000 Encounter for general adult medical examination without abnormal findings: Secondary | ICD-10-CM | POA: Diagnosis not present

## 2020-09-26 DIAGNOSIS — E1165 Type 2 diabetes mellitus with hyperglycemia: Secondary | ICD-10-CM | POA: Diagnosis not present

## 2020-09-26 DIAGNOSIS — I240 Acute coronary thrombosis not resulting in myocardial infarction: Secondary | ICD-10-CM | POA: Diagnosis not present

## 2020-09-26 DIAGNOSIS — Z125 Encounter for screening for malignant neoplasm of prostate: Secondary | ICD-10-CM | POA: Diagnosis not present

## 2020-09-26 DIAGNOSIS — C20 Malignant neoplasm of rectum: Secondary | ICD-10-CM | POA: Diagnosis not present

## 2020-09-26 DIAGNOSIS — D61818 Other pancytopenia: Secondary | ICD-10-CM | POA: Diagnosis not present

## 2020-10-14 ENCOUNTER — Inpatient Hospital Stay (HOSPITAL_COMMUNITY)
Admission: EM | Admit: 2020-10-14 | Discharge: 2020-10-18 | DRG: 375 | Disposition: A | Discharge status: Home / Self Care | Payer: Medicare Other | Attending: Internal Medicine | Admitting: Internal Medicine

## 2020-10-14 ENCOUNTER — Telehealth: Payer: Self-pay | Admitting: Gastroenterology

## 2020-10-14 DIAGNOSIS — K641 Second degree hemorrhoids: Secondary | ICD-10-CM | POA: Diagnosis present

## 2020-10-14 DIAGNOSIS — I252 Old myocardial infarction: Secondary | ICD-10-CM

## 2020-10-14 DIAGNOSIS — I447 Left bundle-branch block, unspecified: Secondary | ICD-10-CM | POA: Diagnosis not present

## 2020-10-14 DIAGNOSIS — Z20822 Contact with and (suspected) exposure to covid-19: Secondary | ICD-10-CM | POA: Diagnosis present

## 2020-10-14 DIAGNOSIS — D500 Iron deficiency anemia secondary to blood loss (chronic): Secondary | ICD-10-CM

## 2020-10-14 DIAGNOSIS — I341 Nonrheumatic mitral (valve) prolapse: Secondary | ICD-10-CM | POA: Diagnosis present

## 2020-10-14 DIAGNOSIS — Z79899 Other long term (current) drug therapy: Secondary | ICD-10-CM

## 2020-10-14 DIAGNOSIS — Z886 Allergy status to analgesic agent status: Secondary | ICD-10-CM

## 2020-10-14 DIAGNOSIS — I110 Hypertensive heart disease with heart failure: Secondary | ICD-10-CM | POA: Diagnosis present

## 2020-10-14 DIAGNOSIS — Z8249 Family history of ischemic heart disease and other diseases of the circulatory system: Secondary | ICD-10-CM

## 2020-10-14 DIAGNOSIS — K626 Ulcer of anus and rectum: Secondary | ICD-10-CM | POA: Diagnosis not present

## 2020-10-14 DIAGNOSIS — R58 Hemorrhage, not elsewhere classified: Secondary | ICD-10-CM | POA: Diagnosis not present

## 2020-10-14 DIAGNOSIS — C20 Malignant neoplasm of rectum: Secondary | ICD-10-CM | POA: Diagnosis not present

## 2020-10-14 DIAGNOSIS — D62 Acute posthemorrhagic anemia: Secondary | ICD-10-CM | POA: Diagnosis present

## 2020-10-14 DIAGNOSIS — Z9221 Personal history of antineoplastic chemotherapy: Secondary | ICD-10-CM

## 2020-10-14 DIAGNOSIS — K219 Gastro-esophageal reflux disease without esophagitis: Secondary | ICD-10-CM | POA: Diagnosis present

## 2020-10-14 DIAGNOSIS — Z8042 Family history of malignant neoplasm of prostate: Secondary | ICD-10-CM

## 2020-10-14 DIAGNOSIS — Z85048 Personal history of other malignant neoplasm of rectum, rectosigmoid junction, and anus: Secondary | ICD-10-CM | POA: Diagnosis present

## 2020-10-14 DIAGNOSIS — K625 Hemorrhage of anus and rectum: Secondary | ICD-10-CM | POA: Diagnosis present

## 2020-10-14 DIAGNOSIS — Z87891 Personal history of nicotine dependence: Secondary | ICD-10-CM

## 2020-10-14 DIAGNOSIS — J439 Emphysema, unspecified: Secondary | ICD-10-CM | POA: Diagnosis present

## 2020-10-14 DIAGNOSIS — K922 Gastrointestinal hemorrhage, unspecified: Secondary | ICD-10-CM | POA: Diagnosis present

## 2020-10-14 DIAGNOSIS — Z923 Personal history of irradiation: Secondary | ICD-10-CM

## 2020-10-14 DIAGNOSIS — Z7984 Long term (current) use of oral hypoglycemic drugs: Secondary | ICD-10-CM

## 2020-10-14 DIAGNOSIS — R0902 Hypoxemia: Secondary | ICD-10-CM | POA: Diagnosis not present

## 2020-10-14 DIAGNOSIS — E119 Type 2 diabetes mellitus without complications: Secondary | ICD-10-CM | POA: Diagnosis present

## 2020-10-14 DIAGNOSIS — I5032 Chronic diastolic (congestive) heart failure: Secondary | ICD-10-CM | POA: Diagnosis not present

## 2020-10-14 DIAGNOSIS — C785 Secondary malignant neoplasm of large intestine and rectum: Secondary | ICD-10-CM | POA: Diagnosis not present

## 2020-10-14 DIAGNOSIS — Z955 Presence of coronary angioplasty implant and graft: Secondary | ICD-10-CM

## 2020-10-14 DIAGNOSIS — C7802 Secondary malignant neoplasm of left lung: Secondary | ICD-10-CM | POA: Diagnosis present

## 2020-10-14 DIAGNOSIS — E1165 Type 2 diabetes mellitus with hyperglycemia: Secondary | ICD-10-CM | POA: Diagnosis not present

## 2020-10-14 DIAGNOSIS — Z91048 Other nonmedicinal substance allergy status: Secondary | ICD-10-CM

## 2020-10-14 DIAGNOSIS — D630 Anemia in neoplastic disease: Secondary | ICD-10-CM | POA: Diagnosis present

## 2020-10-14 DIAGNOSIS — D696 Thrombocytopenia, unspecified: Secondary | ICD-10-CM | POA: Diagnosis present

## 2020-10-14 DIAGNOSIS — E785 Hyperlipidemia, unspecified: Secondary | ICD-10-CM | POA: Diagnosis present

## 2020-10-14 DIAGNOSIS — Z7902 Long term (current) use of antithrombotics/antiplatelets: Secondary | ICD-10-CM

## 2020-10-14 DIAGNOSIS — I2510 Atherosclerotic heart disease of native coronary artery without angina pectoris: Secondary | ICD-10-CM | POA: Diagnosis present

## 2020-10-14 DIAGNOSIS — Z888 Allergy status to other drugs, medicaments and biological substances status: Secondary | ICD-10-CM

## 2020-10-14 LAB — CBC WITH DIFFERENTIAL/PLATELET
Abs Immature Granulocytes: 0.03 10*3/uL (ref 0.00–0.07)
Basophils Absolute: 0 10*3/uL (ref 0.0–0.1)
Basophils Relative: 1 %
Eosinophils Absolute: 0.1 10*3/uL (ref 0.0–0.5)
Eosinophils Relative: 1 %
HCT: 28.9 % — ABNORMAL LOW (ref 39.0–52.0)
Hemoglobin: 8.5 g/dL — ABNORMAL LOW (ref 13.0–17.0)
Immature Granulocytes: 0 %
Lymphocytes Relative: 17 %
Lymphs Abs: 1.2 10*3/uL (ref 0.7–4.0)
MCH: 24.4 pg — ABNORMAL LOW (ref 26.0–34.0)
MCHC: 29.4 g/dL — ABNORMAL LOW (ref 30.0–36.0)
MCV: 82.8 fL (ref 80.0–100.0)
Monocytes Absolute: 0.6 10*3/uL (ref 0.1–1.0)
Monocytes Relative: 9 %
Neutro Abs: 5.2 10*3/uL (ref 1.7–7.7)
Neutrophils Relative %: 72 %
Platelets: 105 10*3/uL — ABNORMAL LOW (ref 150–400)
RBC: 3.49 MIL/uL — ABNORMAL LOW (ref 4.22–5.81)
RDW: 15.5 % (ref 11.5–15.5)
WBC: 7.1 10*3/uL (ref 4.0–10.5)
nRBC: 0 % (ref 0.0–0.2)

## 2020-10-14 LAB — COMPREHENSIVE METABOLIC PANEL
ALT: 41 U/L (ref 0–44)
AST: 35 U/L (ref 15–41)
Albumin: 2.8 g/dL — ABNORMAL LOW (ref 3.5–5.0)
Alkaline Phosphatase: 122 U/L (ref 38–126)
Anion gap: 9 (ref 5–15)
BUN: 9 mg/dL (ref 8–23)
CO2: 23 mmol/L (ref 22–32)
Calcium: 7.5 mg/dL — ABNORMAL LOW (ref 8.9–10.3)
Chloride: 106 mmol/L (ref 98–111)
Creatinine, Ser: 0.77 mg/dL (ref 0.61–1.24)
GFR, Estimated: >60 mL/min (ref >60)
Glucose, Bld: 226 mg/dL — ABNORMAL HIGH (ref 70–99)
Potassium: 3.5 mmol/L (ref 3.5–5.1)
Sodium: 138 mmol/L (ref 135–145)
Total Bilirubin: 0.6 mg/dL (ref 0.3–1.2)
Total Protein: 5.3 g/dL — ABNORMAL LOW (ref 6.5–8.1)

## 2020-10-14 LAB — PROTIME-INR
INR: 1.7 — ABNORMAL HIGH (ref 0.8–1.2)
Prothrombin Time: 18.9 seconds — ABNORMAL HIGH (ref 11.4–15.2)

## 2020-10-14 MED ORDER — LACTATED RINGERS IV BOLUS
1000.0000 mL | Freq: Once | INTRAVENOUS | Status: AC
  Administered 2020-10-14: 1000 mL via INTRAVENOUS

## 2020-10-14 NOTE — ED Provider Notes (Signed)
[name] EMERGENCY DEPARTMENT Provider Note   CSN: [id number] Arrival date & time: 10/14/20  1912     History Chief Complaint  Patient presents with   Blood In [place] is a 74 y.o. male history of rectal cancer with lung metastasis, hypertension, diabetes, thrombocytopenia presents with bright red blood per rectum that started this morning.  Patient states that he last had him see like this about a year ago when he is first diagnosed with rectal cancer.  States he had copious amounts of blood associated with some lightheadedness.  No abdominal pain or rectal pain.  The history is provided by the patient and the spouse.  Rectal Bleeding Quality:  Bright red Amount:  Copious Duration:  1 day Timing:  Constant Chronicity:  New Context comment:  Rectal cancer Similar prior episodes: yes (Last episode 1 year ago)   Relieved by:  Nothing Worsened by:  Defecation Associated symptoms: light-headedness   Associated symptoms: no abdominal pain, no epistaxis, no fever, no hematemesis, no loss of consciousness, no recent illness and no vomiting   Risk factors: anticoagulant use and hx of colorectal cancer        Past Medical History:  Diagnosis Date   Allergic rhinitis    Cancer ([place])    rectal cancer and found spots on his lungs also and is on chemo currently    Diabetes mellitus without complication (HCC)    type 2   GERD (gastroesophageal reflux disease)    Headache    hx of migraines yrs ago   Hemorrhoids    Hyperlipidemia    Hypertension    MVP (mitral valve prolapse)    dx 42 yrs no cardiologist   Rectal bleeding     Patient Active Problem List   Diagnosis Date Noted   Localized edema 05/25/2019   Hypertensive heart disease 05/24/2019   Edema 04/20/2019   CAD in native artery 03/02/2019   Status post coronary artery stent placement    Hyperlipidemia LDL goal <70    Malignant neoplasm metastatic to lung  (HCC)    Hyponatremia    Thrombocytopenia (HCC)    NSTEMI (non-ST elevated myocardial infarction) ([place]) 02/24/2019   Diabetes mellitus type 2, noninsulin dependent ([place]) 02/24/2019   History of rectal cancer    Rectal bleeding    Rectal cancer [facility])     Past Surgical History:  Procedure Laterality Date   COLONOSCOPY  03/03/2017   Proximal rectal mass (biopsied). Transverse colonic polyps status post polypectomy. Mild sigmoid diverticulosis   CORONARY STENT INTERVENTION N/A 02/27/2019   Procedure: CORONARY STENT INTERVENTION;  Surgeon: Martinique, Peter M, MD;  Location: [place] [facility];  Service: Cardiovascular;  Laterality: N/A;   EUS N/A 03/18/2017   Procedure: LOWER ENDOSCOPIC ULTRASOUND (EUS);  Surgeon: Milus Banister, MD;  Location: [facility];  Service: Endoscopy;  Laterality: N/A;   extensive dental work     FLEXIBLE SIGMOIDOSCOPY N/A 02/21/2019   Procedure: FLEXIBLE SIGMOIDOSCOPY;  Surgeon: Jackquline Denmark, MD;  Location: [facility];  Service: Endoscopy;  Laterality: N/A;   ingrown toenail removed  age 25 or 16   LEFT HEART CATH AND CORONARY ANGIOGRAPHY N/A 02/27/2019   Procedure: LEFT HEART CATH AND CORONARY ANGIOGRAPHY;  Surgeon: Martinique, Peter M, MD;  Location: [place] [facility] LAB;  Service: Cardiovascular;  Laterality: N/A;       Family History  Problem Relation Age of Onset   Heart attack Father    Prostate  cancer Father    Colon cancer Neg Hx    Esophageal cancer Neg Hx     Social History   Tobacco Use   Smoking status: Former Smoker    Packs/day: 2.00    Years: 20.00    Pack years: 40.00    Types: Cigarettes   Smokeless tobacco: Former Systems developer    Quit date: 11/23/1998   Tobacco comment: quit 2000  Vaping Use   Vaping Use: Never used  Substance Use Topics   Alcohol use: No   Drug use: No    Home Medications Prior to Admission medications   Medication Sig Start Date End Date Taking? Authorizing Provider  acetaminophen (TYLENOL) 500 MG  tablet Take 1,000 mg by mouth every 6 (six) hours as needed for headache (pain).    [provider]  AMBULATORY NON FORMULARY MEDICATION Currently on 3 chemo medication  and have a pump that is connected for 2 days. Pump bag is fluorouracil 3500mg  CIV mixed with sodium chloride 0.9%    [provider]  atorvastatin (LIPITOR) 80 MG tablet Take 1 tablet (80 mg total) by mouth daily at 6 PM. 02/28/19   Florencia Reasons, MD  clopidogrel (PLAVIX) 75 MG tablet Take 1 tablet (75 mg total) by mouth daily with breakfast. 03/01/19   Florencia Reasons, MD  diphenoxylate-atropine (LOMOTIL) 2.5-0.025 MG tablet Take 1 tablet by mouth 4 (four) times daily as needed for diarrhea or loose stools. 02/12/19   [provider]  ezetimibe (ZETIA) 10 MG tablet TAKE 1 TABLET BY MOUTH EVERY DAY 01/29/20   Richardo Priest, MD  famotidine (PEPCID) 20 MG tablet Take 1 tablet (20 mg total) by mouth as needed for heartburn or indigestion. 02/28/19   Florencia Reasons, MD  Ferrous Sulfate (IRON PO) Take 1 tablet by mouth daily.    [provider]  furosemide (LASIX) 20 MG tablet TAKE 1 TAB DAILY. IF NO WEIGHT LOSS OF 3 OR MORE POUNDS AFTER 2 DAYS, INCREASE TO 2 TAB DAILY. 04/23/20   Richardo Priest, MD  hydrALAZINE (APRESOLINE) 25 MG tablet TAKE 1 TABLET BY MOUTH THREE TIMES A DAY 04/23/20   Richardo Priest, MD  isosorbide mononitrate (IMDUR) 30 MG 24 hr tablet Take 1 tablet (30 mg total) by mouth daily. 03/01/19   Florencia Reasons, MD  KLOR-CON M10 10 MEQ tablet TAKE 2 TABLETS (20 MEQ TOTAL) BY MOUTH 3 (THREE) TIMES DAILY. 08/29/19   Richardo Priest, MD  metFORMIN (GLUCOPHAGE-XR) 500 MG 24 hr tablet Take 500 mg by mouth daily with breakfast.  03/09/17   [provider]  metoprolol succinate (TOPROL-XL) 100 MG 24 hr tablet Take 100 mg by mouth daily. Take with or immediately following a meal.    [provider]  minoxidil (LONITEN) 2.5 MG tablet TAKE 2 TABLETS BY MOUTH 2 TIMES DAILY. 07/08/20   Richardo Priest, MD  nitroGLYCERIN  (NITROSTAT) 0.4 MG SL tablet Place 1 tablet (0.4 mg total) under the tongue every 5 (five) minutes as needed for chest pain. 05/15/20 08/13/20  Richardo Priest, MD  pantoprazole (PROTONIX) 40 MG tablet Take 40 mg by mouth daily. 02/20/19   [provider]  prochlorperazine (COMPAZINE) 10 MG tablet Take 10 mg by mouth 3 (three) times daily as needed for nausea/vomiting. 02/13/19   [provider]  furosemide (LASIX) 20 MG tablet Take 20 mg by mouth 2 (two) times daily. Takes 2 tablets daily on Mon, Wed, and Fri    [provider]  Allergies    Lisinopril, Adhesive [tape], Aspirin, and Neosporin [neomycin-bacitracin zn-polymyx]  Review of Systems   Review of Systems  Constitutional: Negative for chills and fever.  HENT: Negative for ear pain, nosebleeds and sore throat.   Eyes: Negative for pain and visual disturbance.  Respiratory: Negative for cough and shortness of breath.   Cardiovascular: Negative for chest pain and palpitations.  Gastrointestinal: Positive for anal bleeding, blood in stool and hematochezia. Negative for abdominal pain, hematemesis, rectal pain and vomiting.  Genitourinary: Negative for dysuria and hematuria.  Musculoskeletal: Negative for arthralgias and back pain.  Skin: Negative for color change and rash.  Neurological: Positive for light-headedness. Negative for seizures, loss of consciousness and syncope.  All other systems reviewed and are negative.   Physical Exam Updated Vital Signs BP (!) 145/64    Pulse 86    Temp 97.6 F (36.4 C)    Resp 15    Ht 5\' 10"  (1.778 m)    Wt 81.6 kg    SpO2 96%    BMI 25.83 kg/m   Physical Exam Vitals and nursing note reviewed.  Constitutional:      General: He is not in acute distress.    Appearance: He is well-developed. He is not ill-appearing or toxic-appearing.  HENT:     Head: Normocephalic and atraumatic.     Right Ear: External ear normal.     Left Ear: External ear normal.  Eyes:      Conjunctiva/sclera: Conjunctivae normal.  Cardiovascular:     Rate and Rhythm: Normal rate and regular rhythm.     Heart sounds: No murmur heard.   Pulmonary:     Effort: Pulmonary effort is normal. No respiratory distress.     Breath sounds: Normal breath sounds.  Abdominal:     Palpations: Abdomen is soft.     Tenderness: There is no abdominal tenderness.  Genitourinary:    Comments: Rectal exam chaperoned by RN.  Clots of blood from rectum.  Poor tone but able to contract anal sphincter. Musculoskeletal:        General: No swelling, tenderness, deformity or signs of injury. Normal range of motion.     Cervical back: Neck supple.  Skin:    General: Skin is warm and dry.     Coloration: Skin is not pale.  Neurological:     Mental Status: He is alert and oriented to person, place, and time. Mental status is at baseline.     ED Results / Procedures / Treatments   Labs (all labs ordered are listed, but only abnormal results are displayed) Labs Reviewed - No data to display  EKG None  Radiology No results found.  Procedures Procedures (including critical care time)  Medications Ordered in ED Medications - No data to display  ED Course  I have reviewed the triage vital signs and the nursing notes.  Pertinent labs & imaging results that were available during my care of the patient were reviewed by me and considered in my medical decision making (see chart for details).    MDM Rules/Calculators/A&P                         MDM: Dakota Bennett is a 74 y.o. male who presents with BRBPR as per above. I have reviewed the nursing documentation for past medical history, family history, and social history. Pertinent previous records reviewed. He is awake, alert. HDS. Afebrile. Physical exam is most notable for clots of  bright red blood from rectum.  Labs: Hemoglobin 8.5, type and screen ordered Consults: none Tx: 1 L LR  Differential Dx: I am most concerned for rectal bleeding  possibly related to his rectal cancer.  AVM possible. Diverticulosis bleed possible. Given history, physical exam, and work-up, I do not think he has trauma, diverticulitis, aortoenteric fistula, dieulafoy lesion, or bleeding hemorrhoids.  MDM: Dakota Bennett is a 74 y.o. male with a history of rectal cancer presents with right red blood per rectum.  Bright red clots of blood noted on GU exam.  Patient hemodynamically stable but has been symptomatic with lightheadedness today.  Given 1 L LR due to recent lightheadedness and concern for copious blood loss per rectum.  Hemoglobin downtrending.  Type and screen ordered but do not feel that transfusion is indicated at this time.  Feel the patient needs to be admitted for downtrending hemoglobin, bright red blood per rectum, serial CBCs, and further work-up.  Patient wife at bedside amenable to this plan.  Patient mated to hospitalist, handout given.  Admitted in stable condition.  The plan for this patient was discussed with Dr. Sherry Ruffing, who voiced agreement and who oversaw evaluation and treatment of this patient.   Final Clinical Impression(s) / ED Diagnoses Final diagnoses:  None    Rx / DC Orders ED Discharge Orders    None       Darrick Huntsman, MD 10/15/20 0107    Tegeler, Gwenyth Allegra, MD 10/16/20 1123

## 2020-10-14 NOTE — Telephone Encounter (Signed)
Spoke with Claiborne Billings from [place], she states that patient is still having internal bleeding and they are not sure if it is from the tumor or radiation. They are seeking direction from Dr Lyndel Safe and the cause of bleeding

## 2020-10-14 NOTE — ED Triage Notes (Signed)
Pt bib [facility] c/o of rectal pain and bright red blood in stools since 6am this morning. Hx of colon + rectal cancer. Last treatment done approx 1 year ago.

## 2020-10-14 NOTE — Telephone Encounter (Signed)
Please read message below and advise.  

## 2020-10-14 NOTE — Telephone Encounter (Signed)
Please explain exactly what you are asking me to do?

## 2020-10-14 NOTE — H&P (Signed)
History and Physical    Dakota Bennett JJO:[id number] DOB: Dec 05, 1945 DOA: 10/14/2020  PCP: Serita Grammes, MD Patient coming from: Home  Chief Complaint: Rectal bleeding  HPI: Dakota Bennett is a 74 y.o. male with medical history significant of rectal cancer with lung mets, hypertension, hyperlipidemia, type 2 diabetes, GERD, hemorrhoids, mitral valve prolapse, CAD with stent on Plavix, chronic diastolic CHF, chronic thrombocytopenia presenting to the ED with complaints of bright red blood per rectum that started this morning.  Patient states he last received treatment for his rectal cancer over a year ago.  States he is seen by an oncologist in [place] who orders periodic CT scans.  States his chemotherapy is currently on hold with plan to resume it sometime next year.  Patient states this morning around 6 AM when he used the bathroom he noticed large amounts of bright red blood with dark blood clots mixed in.  He has had 5 episodes of rectal bleeding throughout the day which prompted him to come into the ED to be evaluated.  Denies abdominal pain or hematemesis.  Reports having mild dizziness but no chest pain or shortness of breath.  ED Course: Hemodynamically stable.  WBC 7.1, hemoglobin 8.5, hematocrit 28.9, platelet 105K.  Sodium 138, potassium 3.5, chloride 106, bicarb 23, BUN 9, creatinine 0.7, glucose 226.  INR 1.7.  Bright red blood clots noted on rectal exam done in the ED. Patient received 1 L LR bolus.  Review of Systems:  All systems reviewed and apart from history of presenting illness, are negative.  Past Medical History:  Diagnosis Date  . Allergic rhinitis   . Cancer ([place])    rectal cancer and found spots on his lungs also and is on chemo currently   . Diabetes mellitus without complication ([place])    type 2  . GERD (gastroesophageal reflux disease)   . Headache    hx of migraines yrs ago  . Hemorrhoids   . Hyperlipidemia   . Hypertension   . MVP (mitral valve  prolapse)    dx 42 yrs no cardiologist  . Rectal bleeding     Past Surgical History:  Procedure Laterality Date  . COLONOSCOPY  03/03/2017   Proximal rectal mass (biopsied). Transverse colonic polyps status post polypectomy. Mild sigmoid diverticulosis  . CORONARY STENT INTERVENTION N/A 02/27/2019   Procedure: CORONARY STENT INTERVENTION;  Surgeon: Martinique, Peter M, MD;  Location: [place] [facility] LAB;  Service: Cardiovascular;  Laterality: N/A;  . EUS N/A 03/18/2017   Procedure: LOWER ENDOSCOPIC ULTRASOUND (EUS);  Surgeon: Milus Banister, MD;  Location: [facility];  Service: Endoscopy;  Laterality: N/A;  . extensive dental work    . FLEXIBLE SIGMOIDOSCOPY N/A 02/21/2019   Procedure: FLEXIBLE SIGMOIDOSCOPY;  Surgeon: Jackquline Denmark, MD;  [facility]: [facility];  Service: Endoscopy;  Laterality: N/A;  . ingrown toenail removed  age 56 or 56  . LEFT HEART CATH AND CORONARY ANGIOGRAPHY N/A 02/27/2019   Procedure: LEFT HEART CATH AND CORONARY ANGIOGRAPHY;  Surgeon: Martinique, Peter M, MD;  Location: [place] [facility] LAB;  Service: Cardiovascular;  Laterality: N/A;     reports that he has quit smoking. His smoking use included cigarettes. He has a 40.00 pack-year smoking history. He quit smokeless tobacco use about 21 years ago. He reports that he does not drink alcohol and does not use drugs.  Allergies  Allergen Reactions  . Lisinopril Hives       . Adhesive [Tape] Rash    Bandaids  .  Aspirin Other (See Comments)    Burns stomach   . Neosporin [Neomycin-Bacitracin Zn-Polymyx] Rash    Family History  Problem Relation Age of Onset  . Heart attack Father   . Prostate cancer Father   . Colon cancer Neg Hx   . Esophageal cancer Neg Hx     Prior to Admission medications   Medication Sig Start Date End Date Taking? Authorizing Provider  acetaminophen (TYLENOL) 500 MG tablet Take 1,000 mg by mouth every 6 (six) hours as needed for headache (pain).   Yes [provider]  atorvastatin  (LIPITOR) 80 MG tablet Take 1 tablet (80 mg total) by mouth daily at 6 PM. 02/28/19  Yes Florencia Reasons, MD  clopidogrel (PLAVIX) 75 MG tablet Take 1 tablet (75 mg total) by mouth daily with breakfast. 03/01/19  Yes Florencia Reasons, MD  diphenoxylate-atropine (LOMOTIL) 2.5-0.025 MG tablet Take 1 tablet by mouth 4 (four) times daily as needed for diarrhea or loose stools. 02/12/19  Yes [provider]  ezetimibe (ZETIA) 10 MG tablet TAKE 1 TABLET BY MOUTH EVERY DAY 01/29/20  Yes Richardo Priest, MD  famotidine (PEPCID) 20 MG tablet Take 1 tablet (20 mg total) by mouth as needed for heartburn or indigestion. 02/28/19  Yes Florencia Reasons, MD  furosemide (LASIX) 20 MG tablet TAKE 1 TAB DAILY. IF NO WEIGHT LOSS OF 3 OR MORE POUNDS AFTER 2 DAYS, INCREASE TO 2 TAB DAILY. Patient taking differently: Take 20-40 mg by mouth See admin instructions. Mon Wed Fri 40 mg, all other days 20 mg. Also takes 20 mg every evevning 04/23/20  Yes Munley, Hilton Cork, MD  gabapentin (NEURONTIN) 300 MG capsule Take 300 mg by mouth at bedtime.   Yes [provider]  hydrALAZINE (APRESOLINE) 25 MG tablet TAKE 1 TABLET BY MOUTH THREE TIMES A DAY Patient taking differently: Take 25 mg by mouth daily.  04/23/20  Yes Richardo Priest, MD  isosorbide mononitrate (IMDUR) 30 MG 24 hr tablet Take 1 tablet (30 mg total) by mouth daily. 03/01/19  Yes Florencia Reasons, MD  KLOR-CON M10 10 MEQ tablet TAKE 2 TABLETS (20 MEQ TOTAL) BY MOUTH 3 (THREE) TIMES DAILY. Patient taking differently: Take 20 mEq by mouth 3 (three) times daily.  08/29/19  Yes Richardo Priest, MD  metFORMIN (GLUCOPHAGE-XR) 500 MG 24 hr tablet Take 500 mg by mouth daily with breakfast.  03/09/17  Yes [provider]  metoprolol succinate (TOPROL-XL) 100 MG 24 hr tablet Take 100 mg by mouth daily. Take with or immediately following a meal.   Yes [provider]  minoxidil (LONITEN) 2.5 MG tablet TAKE 2 TABLETS BY MOUTH 2 TIMES DAILY. Patient taking differently: Take 5 mg by mouth 2  (two) times daily.  07/08/20  Yes Richardo Priest, MD  pantoprazole (PROTONIX) 40 MG tablet Take 40 mg by mouth daily. 02/20/19  Yes [provider]  prochlorperazine (COMPAZINE) 10 MG tablet Take 10 mg by mouth 3 (three) times daily as needed for nausea/vomiting. 02/13/19  Yes [provider]  [place] Currently on 3 chemo medication  and have a pump that is connected for 2 days. Pump bag is fluorouracil 3500mg  CIV mixed with sodium chloride 0.9%    [provider]  nitroGLYCERIN (NITROSTAT) 0.4 MG SL tablet Place 1 tablet (0.4 mg total) under the tongue every 5 (five) minutes as needed for chest pain. Patient not taking: Reported on 10/14/2020 05/15/20 08/13/20  Richardo Priest, MD  furosemide (  LASIX) 20 MG tablet Take 20 mg by mouth 2 (two) times daily. Takes 2 tablets daily on Mon, Wed, and Fri    [provider]    Physical Exam: Vitals:   10/14/20 2200 10/14/20 2230 10/14/20 2300 10/14/20 2330  BP: 139/62 (!) 134/58 (!) 142/76 122/70  Pulse: 80 84 82 83  Resp: 13 18 13 13   Temp:      SpO2: 98% 97% 96% 97%  Weight:      Height:        Physical Exam Constitutional:      General: He is not in acute distress. HENT:     Head: Normocephalic and atraumatic.  Eyes:     Extraocular Movements: Extraocular movements intact.     Conjunctiva/sclera: Conjunctivae normal.  Cardiovascular:     Rate and Rhythm: Normal rate and regular rhythm.     Pulses: Normal pulses.  Pulmonary:     Effort: Pulmonary effort is normal. No respiratory distress.     Breath sounds: Normal breath sounds. No wheezing or rales.  Abdominal:     General: Bowel sounds are normal.     Palpations: Abdomen is soft.     Tenderness: There is no abdominal tenderness. There is no guarding or rebound.  Musculoskeletal:        General: No swelling or tenderness.     Cervical back: Normal range of motion and neck supple.  Skin:    General: Skin is warm and dry.    Neurological:     General: No focal deficit present.     Mental Status: He is alert and oriented to person, place, and time.     Labs on Admission: I have personally reviewed following labs and imaging studies  CBC: Recent Labs  Lab 10/14/20 2000  WBC 7.1  NEUTROABS 5.2  HGB 8.5*  HCT 28.9*  MCV 82.8  PLT 595*   Basic Metabolic Panel: Recent Labs  Lab 10/14/20 2000  NA 138  K 3.5  CL 106  CO2 23  GLUCOSE 226*  BUN 9  CREATININE 0.77  CALCIUM 7.5*   GFR: Estimated Creatinine Clearance: 86.2 mL/min (by C-G formula based on SCr of 0.77 mg/dL). Liver Function Tests: Recent Labs  Lab 10/14/20 2000  AST 35  ALT 41  ALKPHOS 122  BILITOT 0.6  PROT 5.3*  ALBUMIN 2.8*   No results for input(s): LIPASE, AMYLASE in the last 168 hours. No results for input(s): AMMONIA in the last 168 hours. Coagulation Profile: Recent Labs  Lab 10/14/20 2000  INR 1.7*   Cardiac Enzymes: No results for input(s): CKTOTAL, CKMB, CKMBINDEX, TROPONINI in the last 168 hours. BNP (last 3 results) No results for input(s): PROBNP in the last 8760 hours. HbA1C: No results for input(s): HGBA1C in the last 72 hours. CBG: No results for input(s): GLUCAP in the last 168 hours. Lipid Profile: No results for input(s): CHOL, HDL, LDLCALC, TRIG, CHOLHDL, LDLDIRECT in the last 72 hours. Thyroid Function Tests: No results for input(s): TSH, T4TOTAL, FREET4, T3FREE, THYROIDAB in the last 72 hours. Anemia Panel: No results for input(s): VITAMINB12, FOLATE, FERRITIN, TIBC, IRON, RETICCTPCT in the last 72 hours. Urine analysis: No results found for: COLORURINE, APPEARANCEUR, LABSPEC, PHURINE, GLUCOSEU, HGBUR, BILIRUBINUR, KETONESUR, PROTEINUR, UROBILINOGEN, NITRITE, LEUKOCYTESUR  Radiological Exams on Admission: No results found.  Assessment/Plan Principal Problem:   Rectal bleeding Active Problems:   History of rectal cancer   Diabetes mellitus type 2, noninsulin dependent (HCC)    Hyperlipidemia LDL goal <70  Acute on chronic blood loss anemia   Rectal bleeding/suspected acute lower GI bleed Symptomatic acute on chronic blood loss anemia Bright red blood clots noted on rectal exam done in the ED.  Hemoglobin 8.5, was above 11 on labs in April 2020.  No recent labs for comparison.  Currently hemodynamically stable.  Symptomatic with mild dizziness but no complaints of chest pain or shortness of breath.  Differentials for rectal bleeding include rectal adenocarcinoma, radiation proctitis, and hemorrhoids. Patient was previously admitted in March 2020 for rectal bleeding and flexible sigmoidoscopy done at that time revealed mild radiation-induced changes with no bleeding, no recurrence of rectal cancer, and small nonbleeding internal hemorrhoids. -Type and screen, serial H&H, transfuse PRBCs if hemoglobin less than 7.  Gentle IV fluid hydration and monitor hemodynamics.  Keep n.p.o. at this time.  I have sent a secure chat message to Dr. Loletha Carrow requesting GI consultation in the morning.  He was seen by [facility] during his previous hospitalization.  History of stage IV rectal cancer with metastasis to lung: Diagnosed in April 2018.  He was previously treated with chemotherapy and radiation.  Patient states he is no longer on any treatment and is seen by oncology in [place]. -GI consulted for evaluation of rectal bleeding as mentioned above.  Please ensure oncology follow-up.  Hypertension: Currently stable. -Hold antihypertensives in the setting of acute GI bleed  Hyperlipidemia -Continue Lipitor and Zetia  Non-insulin-dependent type 2 diabetes -Check A1c.  Sliding scale insulin sensitive every 4 hours as patient is currently n.p.o. Hold home Metformin.  CAD with stent: Stable.  Patient is not endorsing any anginal symptoms at present. -Hold Plavix given active rectal bleeding  Chronic diastolic CHF: Stable.  No signs of volume overload. -Hold home diuretic at this  time.  Chronic thrombocytopenia: Platelet count currently 105K, stable compared to prior labs. -Continue to monitor  DVT prophylaxis: SCDs Code Status: Patient wishes to be full code. Family Communication: No family available at this time. Disposition Plan: Status is: Observation  The patient remains OBS appropriate and will d/c before 2 midnights.  Dispo: The patient is from: Home              Anticipated d/c is to: Home              Anticipated d/c date is: 2 days              Patient currently is not medically stable to d/c.  The medical decision making on this patient was of high complexity and the patient is at high risk for clinical deterioration, therefore this is a level 3 visit.  Shela Leff MD [facility]  If 7PM-7AM, please contact night-coverage [email or web address]  10/15/2020, 12:08 AM

## 2020-10-15 ENCOUNTER — Other Ambulatory Visit: Payer: Self-pay | Admitting: Hematology and Oncology

## 2020-10-15 DIAGNOSIS — K625 Hemorrhage of anus and rectum: Secondary | ICD-10-CM

## 2020-10-15 DIAGNOSIS — I110 Hypertensive heart disease with heart failure: Secondary | ICD-10-CM | POA: Diagnosis present

## 2020-10-15 DIAGNOSIS — I252 Old myocardial infarction: Secondary | ICD-10-CM | POA: Diagnosis not present

## 2020-10-15 DIAGNOSIS — E785 Hyperlipidemia, unspecified: Secondary | ICD-10-CM | POA: Diagnosis present

## 2020-10-15 DIAGNOSIS — Z79899 Other long term (current) drug therapy: Secondary | ICD-10-CM | POA: Diagnosis not present

## 2020-10-15 DIAGNOSIS — Z20822 Contact with and (suspected) exposure to covid-19: Secondary | ICD-10-CM | POA: Diagnosis present

## 2020-10-15 DIAGNOSIS — D62 Acute posthemorrhagic anemia: Secondary | ICD-10-CM | POA: Diagnosis present

## 2020-10-15 DIAGNOSIS — K641 Second degree hemorrhoids: Secondary | ICD-10-CM | POA: Diagnosis present

## 2020-10-15 DIAGNOSIS — K219 Gastro-esophageal reflux disease without esophagitis: Secondary | ICD-10-CM | POA: Diagnosis present

## 2020-10-15 DIAGNOSIS — J439 Emphysema, unspecified: Secondary | ICD-10-CM | POA: Diagnosis present

## 2020-10-15 DIAGNOSIS — I5032 Chronic diastolic (congestive) heart failure: Secondary | ICD-10-CM | POA: Diagnosis present

## 2020-10-15 DIAGNOSIS — D500 Iron deficiency anemia secondary to blood loss (chronic): Secondary | ICD-10-CM

## 2020-10-15 DIAGNOSIS — K922 Gastrointestinal hemorrhage, unspecified: Secondary | ICD-10-CM

## 2020-10-15 DIAGNOSIS — Z888 Allergy status to other drugs, medicaments and biological substances status: Secondary | ICD-10-CM | POA: Diagnosis not present

## 2020-10-15 DIAGNOSIS — E119 Type 2 diabetes mellitus without complications: Secondary | ICD-10-CM

## 2020-10-15 DIAGNOSIS — K626 Ulcer of anus and rectum: Secondary | ICD-10-CM | POA: Diagnosis present

## 2020-10-15 DIAGNOSIS — Z886 Allergy status to analgesic agent status: Secondary | ICD-10-CM | POA: Diagnosis not present

## 2020-10-15 DIAGNOSIS — C7802 Secondary malignant neoplasm of left lung: Secondary | ICD-10-CM | POA: Diagnosis present

## 2020-10-15 DIAGNOSIS — I2510 Atherosclerotic heart disease of native coronary artery without angina pectoris: Secondary | ICD-10-CM | POA: Diagnosis present

## 2020-10-15 DIAGNOSIS — Z7984 Long term (current) use of oral hypoglycemic drugs: Secondary | ICD-10-CM | POA: Diagnosis not present

## 2020-10-15 DIAGNOSIS — D696 Thrombocytopenia, unspecified: Secondary | ICD-10-CM | POA: Diagnosis present

## 2020-10-15 DIAGNOSIS — K6289 Other specified diseases of anus and rectum: Secondary | ICD-10-CM | POA: Diagnosis not present

## 2020-10-15 DIAGNOSIS — Z85048 Personal history of other malignant neoplasm of rectum, rectosigmoid junction, and anus: Secondary | ICD-10-CM | POA: Diagnosis not present

## 2020-10-15 DIAGNOSIS — K921 Melena: Secondary | ICD-10-CM | POA: Diagnosis not present

## 2020-10-15 DIAGNOSIS — D630 Anemia in neoplastic disease: Secondary | ICD-10-CM | POA: Diagnosis present

## 2020-10-15 DIAGNOSIS — C20 Malignant neoplasm of rectum: Secondary | ICD-10-CM

## 2020-10-15 DIAGNOSIS — I341 Nonrheumatic mitral (valve) prolapse: Secondary | ICD-10-CM | POA: Diagnosis present

## 2020-10-15 DIAGNOSIS — C785 Secondary malignant neoplasm of large intestine and rectum: Secondary | ICD-10-CM | POA: Diagnosis present

## 2020-10-15 DIAGNOSIS — Z91048 Other nonmedicinal substance allergy status: Secondary | ICD-10-CM | POA: Diagnosis not present

## 2020-10-15 DIAGNOSIS — Z7902 Long term (current) use of antithrombotics/antiplatelets: Secondary | ICD-10-CM | POA: Diagnosis not present

## 2020-10-15 HISTORY — DX: Iron deficiency anemia secondary to blood loss (chronic): D50.0

## 2020-10-15 HISTORY — DX: Gastrointestinal hemorrhage, unspecified: K92.2

## 2020-10-15 LAB — CBC
HCT: 28.1 % — ABNORMAL LOW (ref 39.0–52.0)
HCT: 27.6 % — ABNORMAL LOW (ref 39.0–52.0)
HCT: 25.4 % — ABNORMAL LOW (ref 39.0–52.0)
Hemoglobin: 8.5 g/dL — ABNORMAL LOW (ref 13.0–17.0)
Hemoglobin: 8.5 g/dL — ABNORMAL LOW (ref 13.0–17.0)
Hemoglobin: 8 g/dL — ABNORMAL LOW (ref 13.0–17.0)
MCH: 24.4 pg — ABNORMAL LOW (ref 26.0–34.0)
MCH: 24.4 pg — ABNORMAL LOW (ref 26.0–34.0)
MCH: 25.2 pg — ABNORMAL LOW (ref 26.0–34.0)
MCHC: 30.2 g/dL (ref 30.0–36.0)
MCHC: 30.8 g/dL (ref 30.0–36.0)
MCHC: 31.5 g/dL (ref 30.0–36.0)
MCV: 80.7 fL (ref 80.0–100.0)
MCV: 79.1 fL — ABNORMAL LOW (ref 80.0–100.0)
MCV: 80.1 fL (ref 80.0–100.0)
Platelets: 100 10*3/uL — ABNORMAL LOW (ref 150–400)
Platelets: 100 10*3/uL — ABNORMAL LOW (ref 150–400)
Platelets: 91 10*3/uL — ABNORMAL LOW (ref 150–400)
RBC: 3.48 MIL/uL — ABNORMAL LOW (ref 4.22–5.81)
RBC: 3.49 MIL/uL — ABNORMAL LOW (ref 4.22–5.81)
RBC: 3.17 MIL/uL — ABNORMAL LOW (ref 4.22–5.81)
RDW: 15.7 % — ABNORMAL HIGH (ref 11.5–15.5)
RDW: 15.7 % — ABNORMAL HIGH (ref 11.5–15.5)
RDW: 15.7 % — ABNORMAL HIGH (ref 11.5–15.5)
WBC: 7 10*3/uL (ref 4.0–10.5)
WBC: 7.1 10*3/uL (ref 4.0–10.5)
WBC: 5.2 10*3/uL (ref 4.0–10.5)
nRBC: 0 % (ref 0.0–0.2)
nRBC: 0 % (ref 0.0–0.2)
nRBC: 0 % (ref 0.0–0.2)

## 2020-10-15 LAB — ABO/RH: ABO/RH(D): O POS

## 2020-10-15 LAB — GLUCOSE, CAPILLARY
Glucose-Capillary: 124 mg/dL — ABNORMAL HIGH (ref 70–99)
Glucose-Capillary: 148 mg/dL — ABNORMAL HIGH (ref 70–99)
Glucose-Capillary: 140 mg/dL — ABNORMAL HIGH (ref 70–99)
Glucose-Capillary: 150 mg/dL — ABNORMAL HIGH (ref 70–99)
Glucose-Capillary: 122 mg/dL — ABNORMAL HIGH (ref 70–99)

## 2020-10-15 LAB — BASIC METABOLIC PANEL
Anion gap: 11 (ref 5–15)
BUN: 10 mg/dL (ref 8–23)
CO2: 23 mmol/L (ref 22–32)
Calcium: 8.2 mg/dL — ABNORMAL LOW (ref 8.9–10.3)
Chloride: 104 mmol/L (ref 98–111)
Creatinine, Ser: 0.83 mg/dL (ref 0.61–1.24)
GFR, Estimated: >60 mL/min (ref >60)
Glucose, Bld: 259 mg/dL — ABNORMAL HIGH (ref 70–99)
Potassium: 3.7 mmol/L (ref 3.5–5.1)
Sodium: 138 mmol/L (ref 135–145)

## 2020-10-15 LAB — CBG MONITORING, ED: Glucose-Capillary: 117 mg/dL — ABNORMAL HIGH (ref 70–99)

## 2020-10-15 LAB — RESPIRATORY PANEL BY RT PCR (FLU A&B, COVID)
Influenza A by PCR: NEGATIVE
Influenza B by PCR: NEGATIVE
SARS Coronavirus 2 by RT PCR: NEGATIVE

## 2020-10-15 LAB — HEMOGLOBIN A1C
Hgb A1c MFr Bld: 8.7 % — ABNORMAL HIGH (ref 4.8–5.6)
Mean Plasma Glucose: 202.99 mg/dL

## 2020-10-15 LAB — PROTIME-INR
INR: 1.2 (ref 0.8–1.2)
Prothrombin Time: 14.5 seconds (ref 11.4–15.2)

## 2020-10-15 MED ORDER — SODIUM CHLORIDE 0.9 % IV SOLN: INTRAVENOUS | Status: AC

## 2020-10-15 MED ORDER — INSULIN ASPART 100 UNIT/ML ~~LOC~~ SOLN
0.0000 [IU] | SUBCUTANEOUS | Status: DC
  Administered 2020-10-15 (×3): 1 [IU] via SUBCUTANEOUS

## 2020-10-15 MED ORDER — ATORVASTATIN CALCIUM 80 MG PO TABS
80.0000 mg | ORAL_TABLET | Freq: Every day | ORAL | Status: DC
  Administered 2020-10-15 – 2020-10-17 (×3): 80 mg via ORAL
  Filled 2020-10-15 (×3): qty 1

## 2020-10-15 MED ORDER — FAMOTIDINE 20 MG PO TABS
20.0000 mg | ORAL_TABLET | Freq: Two times a day (BID) | ORAL | Status: DC | PRN
  Administered 2020-10-15: 20 mg via ORAL
  Filled 2020-10-15: qty 1

## 2020-10-15 MED ORDER — EZETIMIBE 10 MG PO TABS
10.0000 mg | ORAL_TABLET | Freq: Every day | ORAL | Status: DC
  Administered 2020-10-15 – 2020-10-17 (×3): 10 mg via ORAL
  Filled 2020-10-15 (×3): qty 1

## 2020-10-15 MED ORDER — PANTOPRAZOLE SODIUM 40 MG PO TBEC: 40.0000 mg | DELAYED_RELEASE_TABLET | Freq: Every day | ORAL | Status: DC

## 2020-10-15 MED ORDER — ACETAMINOPHEN 325 MG PO TABS
650.0000 mg | ORAL_TABLET | Freq: Four times a day (QID) | ORAL | Status: DC | PRN
  Administered 2020-10-15: 650 mg via ORAL
  Filled 2020-10-15: qty 2

## 2020-10-15 MED ORDER — LABETALOL HCL 5 MG/ML IV SOLN: 10.0000 mg | INTRAVENOUS | Status: DC | PRN

## 2020-10-15 MED ORDER — SODIUM CHLORIDE 0.9% FLUSH: 10.0000 mL | INTRAVENOUS | Status: DC | PRN

## 2020-10-15 MED ORDER — ACETAMINOPHEN 650 MG RE SUPP: 650.0000 mg | Freq: Four times a day (QID) | RECTAL | Status: DC | PRN

## 2020-10-15 MED ORDER — CHLORHEXIDINE GLUCONATE CLOTH 2 % EX PADS
6.0000 | MEDICATED_PAD | Freq: Every day | CUTANEOUS | Status: DC
  Administered 2020-10-15 – 2020-10-16 (×2): 6 via TOPICAL

## 2020-10-15 MED ORDER — INSULIN ASPART 100 UNIT/ML ~~LOC~~ SOLN
0.0000 [IU] | Freq: Three times a day (TID) | SUBCUTANEOUS | Status: DC
  Administered 2020-10-15 – 2020-10-16 (×2): 1 [IU] via SUBCUTANEOUS
  Administered 2020-10-17: 2 [IU] via SUBCUTANEOUS

## 2020-10-15 MED ORDER — SODIUM CHLORIDE 0.9% FLUSH
10.0000 mL | Freq: Two times a day (BID) | INTRAVENOUS | Status: DC
  Administered 2020-10-15 – 2020-10-17 (×4): 10 mL

## 2020-10-15 NOTE — Progress Notes (Addendum)
PROGRESS NOTE    Dakota Bennett   NWG:[id number]  DOB: 1946/10/30  DOA: 10/14/2020 PCP: Serita Grammes, MD   Brief Narrative:  Dakota Bennett is a 74 y.o. male with medical history significant of rectal cancer with lung mets, hypertension, hyperlipidemia, type 2 diabetes, GERD, hemorrhoids, mitral valve prolapse, CAD with stent on Plavix, chronic diastolic CHF, chronic thrombocytopenia presenting to the ED with complaints of bright red blood per rectum that started this morning. Patient states this morning around 6 AM when he used the bathroom he noticed large amounts of bright red blood with dark blood clots mixed in. he states he thinks he sat on the toilet for about 30 min passing blood and clots.  He has had 5 episodes of rectal bleeding throughout the day which prompted him to come into the ED to be evaluated.  Denies abdominal pain or hematemesis.  Reports having mild dizziness but no chest pain or shortness of breath.    Subjective: He has had further blood mixed with stool today. No dizziness. No abdominal pian.   Assessment & Plan:   Principal Problem:   Rectal bleeding - with large amount of pure bright red blood yesterday, blood mixed with stool and clots - he had bleeding last year in relation to his cancer but has not had any in 1 yr - appreciate GI eval- awaiting input- Plavix on hold - f/u Hb Q 12 hrs - cont diet per GI  Active Problems:   Metastatic rectal cancer with mets to lungs (02/2017) - follows in [place]   - CT chest/abd pelvis in 10/21> mild progression of pulm meds, new left mediastinal LN and enlargement of LLL nodule - no recurrence of metastatic cancer in abd/pelvis - stable splenomegaly    Diabetes mellitus type 2, noninsulin dependent (HCC) - A1c is 8.7 - hold Metformin- cont SSI  HTN - home meds> Hydralazine, Toprol, Imdur, Minoxidil  - hold in setting of GI bleed - Can use PRN Labetalol if  SBP is > 180  Thrombocytopenia -  chronic issue-  Elevated INR - INR 1.7 yesterday and 1.2 today - follow  CAD s/p stenting - hold Plavix  Chronic d CHF - hold Lasix  Time spent in minutes: 35 DVT prophylaxis: SCDs Start: 10/15/20 0005  Code Status: Full code Family Communication:  Disposition Plan:  Status is: Inpatient  The patient will require care spanning > 2 midnights and should be moved to inpatient because: ongoing GI bleed  Dispo: The patient is from: Home              Anticipated d/c is to: TBD              Anticipated d/c date is: > 3 days              Patient currently is not medically stable to d/c.      Consultants:   GI Procedures:   none Antimicrobials:  Anti-infectives (From admission, onward)   None       Objective: Vitals:   10/15/20 0339 10/15/20 0509 10/15/20 0811 10/15/20 1332  BP: (!) 150/57 (!) 126/57 (!) 130/53 133/61  Pulse: 68 81 78 80  Resp: 17 17 16 18   Temp: 98.2 F (36.8 C) 98.6 F (37 C) 98 F (36.7 C) 98 F (36.7 C)  TempSrc: Oral Oral Oral Oral  SpO2: 96% 96% 97% 97%  Weight:      Height:        Intake/Output Summary (  Last 24 hours) at 10/15/2020 1458 Last data filed at 10/15/2020 1148 Gross per 24 hour  Intake 2110.12 ml  Output --  Net 2110.12 ml   Filed Weights   10/14/20 1920  Weight: 81.6 kg    Examination: General exam: Appears comfortable  HEENT: PERRLA, oral mucosa moist, no sclera icterus or thrush Respiratory system: Clear to auscultation. Respiratory effort normal. Cardiovascular system: S1 & S2 heard, RRR.   Gastrointestinal system: Abdomen soft, non-tender, nondistended. Normal bowel sounds. Central nervous system: Alert and oriented. No focal neurological deficits. Extremities: No cyanosis, clubbing or edema Skin: No rashes or ulcers Psychiatry:  Mood & affect appropriate.     Data Reviewed: I have personally reviewed following labs and imaging studies  CBC: Recent Labs  Lab 10/14/20 2000 10/15/20 0226  10/15/20 0554 10/15/20 1228  WBC 7.1 7.0 7.1 5.2  NEUTROABS 5.2  --   --   --   HGB 8.5* 8.5* 8.5* 8.0*  HCT 28.9* 28.1* 27.6* 25.4*  MCV 82.8 80.7 79.1* 80.1  PLT 105* 100* 100* 91*   Basic Metabolic Panel: Recent Labs  Lab 10/14/20 2000 10/15/20 1228  NA 138 138  K 3.5 3.7  CL 106 104  CO2 23 23  GLUCOSE 226* 259*  BUN 9 10  CREATININE 0.77 0.83  CALCIUM 7.5* 8.2*   GFR: Estimated Creatinine Clearance: 83.1 mL/min (by C-G formula based on SCr of 0.83 mg/dL). Liver Function Tests: Recent Labs  Lab 10/14/20 2000  AST 35  ALT 41  ALKPHOS 122  BILITOT 0.6  PROT 5.3*  ALBUMIN 2.8*   No results for input(s): LIPASE, AMYLASE in the last 168 hours. No results for input(s): AMMONIA in the last 168 hours. Coagulation Profile: Recent Labs  Lab 10/14/20 2000 10/15/20 1228  INR 1.7* 1.2   Cardiac Enzymes: No results for input(s): CKTOTAL, CKMB, CKMBINDEX, TROPONINI in the last 168 hours. BNP (last 3 results) No results for input(s): PROBNP in the last 8760 hours. HbA1C: Recent Labs    10/15/20 0226  HGBA1C 8.7*   CBG: Recent Labs  Lab 10/15/20 0022 10/15/20 0334 10/15/20 0817 10/15/20 1146  GLUCAP 117* 124* 148* 140*   Lipid Profile: No results for input(s): CHOL, HDL, LDLCALC, TRIG, CHOLHDL, LDLDIRECT in the last 72 hours. Thyroid Function Tests: No results for input(s): TSH, T4TOTAL, FREET4, T3FREE, THYROIDAB in the last 72 hours. Anemia Panel: No results for input(s): VITAMINB12, FOLATE, FERRITIN, TIBC, IRON, RETICCTPCT in the last 72 hours. Urine analysis: No results found for: COLORURINE, APPEARANCEUR, LABSPEC, PHURINE, GLUCOSEU, HGBUR, BILIRUBINUR, KETONESUR, PROTEINUR, UROBILINOGEN, NITRITE, LEUKOCYTESUR Sepsis Labs: @LABRCNTIP (procalcitonin:4,lacticidven:4) )No results found for this or any previous visit (from the past 240 hour(s)).       Radiology Studies: No results found.    Scheduled Meds: . atorvastatin  80 mg Oral q1800  .  Chlorhexidine Gluconate Cloth  6 each Topical Daily  . ezetimibe  10 mg Oral Daily  . insulin aspart  0-9 Units Subcutaneous Q4H  . sodium chloride flush  10-40 mL Intracatheter Q12H   Continuous Infusions:   LOS: 0 days      Debbe Odea, MD [facility] Pager: [email or web address] 10/15/2020, 2:58 PM

## 2020-10-15 NOTE — Consult Note (Signed)
[facility] Consult: 8:15 AM 10/15/2020  LOS: 0 days    Referring Provider: Dr Wynelle Cleveland  Primary Care Physician:  Serita Grammes, MD Primary Gastroenterologist:  Dr. Lyndel Safe.       Reason for Consultation:  Rectal bleeding.     HPI: Dakota Bennett is a 74 y.o. male.  PMH NIDDM.  HTN.  HLD.  CAD.  NSTEMI  02/2019, coronary catheterization with PCI and DES placement x3.  Chronic Plavix (last dose 11/22), no aspirin or NSAIDs.  By 05/2020 echocardiogram LVEF 60 to 33%, grade 2 diastolic dysfunction no significant valvular disease or increased pulmonary artery pressures.  Emphysema  Diagnosed with rectal cancer w lung mets 02/2017 undergoing colonoscopy for positive Cologuard test.  Staged T3N1 by 02/2017 lower EUS .   Followed by oncologist Dr. Azalee Course.  No surgery due to mets.   01/2019 Flex sig for eval ongoing rectal bleeding: Mild, nonbleeding radiation changes with small telangiectasia c/w mild radiation proctitis, no cancer recurrence.  Small, nonbleeding internal hemorrhoids. Oncology recommended palliative chemotherapy.  Persistent thrombocytopenia requiring lowering of chemo dosing.  Thrombocytopenia has made it difficult to stay on chemo schedule.  With progression of disease, chemotherapy discontinued in May 2020.   Received Feraheme at some point at the [facility] cancer center but it has been more than a year ago.  Not on oral iron, B12 or folate supplements.  No previous blood product transfusion.  CT chest, abdomen, pelvis 09/06/2020 showed mild progression of pulmonary mets, new left mediastinal lymph node and slight enlargement of index left lower lobe nodule.  Stable treatment changes in pelvis with no recurrence of metastatic carcinoma in the abdomen and pelvis.  Stable splenomegaly,  recanalization of paraumbilical veins suspicious for portal venous hypertension.  Stable right inguinal hernia containing a portion of the urinary bladder.  Stable left renal atrophy.  Aortic atherosclerosis.   Hgb in October was 11.3.  Platelet 111,000, up from 95K.  CEA 457 up from  September.    Patient had not had any rectal bleeding for more than a year.  Using Preparation H suppositories, cream and ointment for rectal pain and what sounds like protruding but not bleeding hemorrhoids.  Stools occur daily, brown in color.  At 6 AM he had 30 to 40 minutes of passing liquid blood without any stool.  This recurred within a few hours but he had brown, nonbloody stools in the afternoon.  At 5 PM he had another episode of large-volume, sustained rectal bleeding.  Slightly nonsustained dizziness.  No chills, no presyncope.  Called EMS and transported to the ED.  Hgb stable at 8.5 since arrival yesterday evening.  MCV 79. Platelets 100 K. Other than albumin 2.8, LFTs normal.  Glucose 226.  No renal dysfunction or electrolyte issues. Blood pressures 140s over 60s, currently 120s over 50s.  Heart rate in the 80s.  Sats >/= 95%.    Today he has had brown stools mixed with blood but no recurrence of pure hematochezia.  No abdominal pain.  No nausea or vomiting.  Good appetite.  Stable weight.  Family history:   No family history of colorectal, esophageal or gastric cancers or issues.  Social history No alcohol, no cigarette/tobacco.  Lives with his wife and [place].    Past Medical History:  Diagnosis Date  . Allergic rhinitis   . Cancer ([place])    rectal cancer and found spots on his lungs also and is on chemo currently   . Diabetes mellitus without complication ([place])    type 2  . GERD (gastroesophageal reflux disease)   . Headache    hx of migraines yrs ago  . Hemorrhoids   . Hyperlipidemia   . Hypertension   . MVP (mitral valve prolapse)    dx 42 yrs no cardiologist  . Rectal bleeding      Past Surgical History:  Procedure Laterality Date  . COLONOSCOPY  03/03/2017   Proximal rectal mass (biopsied). Transverse colonic polyps status post polypectomy. Mild sigmoid diverticulosis  . CORONARY STENT INTERVENTION N/A 02/27/2019   Procedure: CORONARY STENT INTERVENTION;  Surgeon: Martinique, Peter M, MD;  Location: [facility];  Service: Cardiovascular;  Laterality: N/A;  . EUS N/A 03/18/2017   Procedure: LOWER ENDOSCOPIC ULTRASOUND (EUS);  Surgeon: Milus Banister, MD;  Location: [facility];  Service: Endoscopy;  Laterality: N/A;  . extensive dental work    . FLEXIBLE SIGMOIDOSCOPY N/A 02/21/2019   Procedure: FLEXIBLE SIGMOIDOSCOPY;  Surgeon: Jackquline Denmark, MD;  Location: [facility];  Service: Endoscopy;  Laterality: N/A;  . ingrown toenail removed  age 34 or 66  . LEFT HEART CATH AND CORONARY ANGIOGRAPHY N/A 02/27/2019   Procedure: LEFT HEART CATH AND CORONARY ANGIOGRAPHY;  Surgeon: Martinique, Peter M, MD;  Location: [facility];  Service: Cardiovascular;  Laterality: N/A;    Prior to Admission medications   Medication Sig Start Date End Date Taking? Authorizing Provider  acetaminophen (TYLENOL) 500 MG tablet Take 1,000 mg by mouth every 6 (six) hours as needed for headache (pain).   Yes [provider]  atorvastatin (LIPITOR) 80 MG tablet Take 1 tablet (80 mg total) by mouth daily at 6 PM. 02/28/19  Yes Florencia Reasons, MD  clopidogrel (PLAVIX) 75 MG tablet Take 1 tablet (75 mg total) by mouth daily with breakfast. 03/01/19  Yes Florencia Reasons, MD  diphenoxylate-atropine (LOMOTIL) 2.5-0.025 MG tablet Take 1 tablet by mouth 4 (four) times daily as needed for diarrhea or loose stools. 02/12/19  Yes [provider]  ezetimibe (ZETIA) 10 MG tablet TAKE 1 TABLET BY MOUTH EVERY DAY 01/29/20  Yes Richardo Priest, MD  famotidine (PEPCID) 20 MG tablet Take 1 tablet (20 mg total) by mouth as needed for heartburn or indigestion. 02/28/19  Yes Florencia Reasons, MD  furosemide (LASIX) 20 MG  tablet TAKE 1 TAB DAILY. IF NO WEIGHT LOSS OF 3 OR MORE POUNDS AFTER 2 DAYS, INCREASE TO 2 TAB DAILY. Patient taking differently: Take 20-40 mg by mouth See admin instructions. Mon Wed Fri 40 mg, all other days 20 mg. Also takes 20 mg every evevning 04/23/20  Yes Munley, Hilton Cork, MD  gabapentin (NEURONTIN) 300 MG capsule Take 300 mg by mouth at bedtime.   Yes [provider]  hydrALAZINE (APRESOLINE) 25 MG tablet TAKE 1 TABLET BY MOUTH THREE TIMES A DAY Patient taking differently: Take 25 mg by mouth daily.  04/23/20  Yes Richardo Priest, MD  isosorbide mononitrate (IMDUR) 30 MG 24 hr tablet Take 1 tablet (30 mg total) by mouth daily. 03/01/19  Yes Florencia Reasons,  MD  KLOR-CON M10 10 MEQ tablet TAKE 2 TABLETS (20 MEQ TOTAL) BY MOUTH 3 (THREE) TIMES DAILY. Patient taking differently: Take 20 mEq by mouth 3 (three) times daily.  08/29/19  Yes Richardo Priest, MD  metFORMIN (GLUCOPHAGE-XR) 500 MG 24 hr tablet Take 500 mg by mouth daily with breakfast.  03/09/17  Yes [provider]  metoprolol succinate (TOPROL-XL) 100 MG 24 hr tablet Take 100 mg by mouth daily. Take with or immediately following a meal.   Yes [provider]  minoxidil (LONITEN) 2.5 MG tablet TAKE 2 TABLETS BY MOUTH 2 TIMES DAILY. Patient taking differently: Take 5 mg by mouth 2 (two) times daily.  07/08/20  Yes Richardo Priest, MD  pantoprazole (PROTONIX) 40 MG tablet Take 40 mg by mouth daily. 02/20/19  Yes [provider]  prochlorperazine (COMPAZINE) 10 MG tablet Take 10 mg by mouth 3 (three) times daily as needed for nausea/vomiting. 02/13/19  Yes [provider]  [place] Currently on 3 chemo medication  and have a pump that is connected for 2 days. Pump bag is fluorouracil 3500mg  CIV mixed with sodium chloride 0.9%    [provider]  nitroGLYCERIN (NITROSTAT) 0.4 MG SL tablet Place 1 tablet (0.4 mg total) under the tongue every 5 (five) minutes as needed for chest  pain. Patient not taking: Reported on 10/14/2020 05/15/20 08/13/20  Richardo Priest, MD  furosemide (LASIX) 20 MG tablet Take 20 mg by mouth 2 (two) times daily. Takes 2 tablets daily on Mon, Wed, and Fri    [provider]    Scheduled Meds: . atorvastatin  80 mg Oral q1800  . ezetimibe  10 mg Oral Daily  . insulin aspart  0-9 Units Subcutaneous Q4H   Infusions: . sodium chloride 75 mL/hr at 10/15/20 0227   PRN Meds: acetaminophen **OR** acetaminophen   Allergies as of 10/14/2020 - Review Complete 10/14/2020  Allergen Reaction Noted  . Lisinopril Hives 03/17/2017  . Adhesive [tape] Rash 03/16/2017  . Aspirin Other (See Comments) 03/16/2017  . Neosporin [neomycin-bacitracin zn-polymyx] Rash 03/16/2017    Family History  Problem Relation Age of Onset  . Heart attack Father   . Prostate cancer Father   . Colon cancer Neg Hx   . Esophageal cancer Neg Hx     Social History   Socioeconomic History  . Marital status: Married    Spouse name: Not on file  . Number of children: 3  . Years of education: Not on file  . Highest education level: Not on file  Occupational History  . Not on file  Tobacco Use  . Smoking status: Former Smoker    Packs/day: 2.00    Years: 20.00    Pack years: 40.00    Types: Cigarettes  . Smokeless tobacco: Former Systems developer    Quit date: 11/23/1998  . Tobacco comment: quit 2000  Vaping Use  . Vaping Use: Never used  Substance and Sexual Activity  . Alcohol use: No  . Drug use: No  . Sexual activity: Not on file  Other Topics Concern  . Not on file  Social History Narrative  . Not on file   Social Determinants of Health   Financial Resource Strain:   . Difficulty of Paying Living Expenses: Not on file  Food Insecurity:   . Worried About Charity fundraiser in the Last Year: Not on file  . Ran Out of Food in the Last Year: Not on file  Transportation Needs:   . Film/video editor (Medical): Not on file  . Lack of Transportation  (Non-Medical): Not on file  Physical Activity:   . Days of Exercise per Week: Not on file  . Minutes of Exercise per Session: Not on file  Stress:   . Feeling of Stress : Not on file  Social Connections:   . Frequency of Communication with Friends and Family: Not on file  . Frequency of Social Gatherings with Friends and Family: Not on file  . Attends Religious Services: Not on file  . Active Member of Clubs or Organizations: Not on file  . Attends Archivist Meetings: Not on file  . Marital Status: Not on file  Intimate Partner Violence:   . Fear of Current or Ex-Partner: Not on file  . Emotionally Abused: Not on file  . Physically Abused: Not on file  . Sexually Abused: Not on file    REVIEW OF SYSTEMS: Constitutional: No weakness, no fatigue ENT: Occasionally sees some small amount of blood when he blows his nose but no true epistaxis.  No issues with mastication so long as he is wearing his full set of dentures Pulm: Shortness of breath, no cough. CV:  No palpitations, no LE edema.  No angina GU:  No hematuria, no frequency GI: See HPI Heme: Other than the rectal bleeding no excessive or unusual bleeding or bruising Transfusions: See HPI Neuro: Slight nonsustained dizziness yesterday.  No seizures, no syncope.  No headaches, no peripheral tingling or numbness Derm:  No itching, no rash or sores.  Endocrine:  No sweats or chills.  No polyuria or dysuria Immunization: Vaccinated for COVID-19.  Has not yet received a booster shot. Travel:  None beyond local counties in last few months.    PHYSICAL EXAM: Vital signs in last 24 hours: Vitals:   10/15/20 0509 10/15/20 0811  BP: (!) 126/57 (!) 130/53  Pulse: 81 78  Resp: 17 16  Temp: 98.6 F (37 C) 98 F (36.7 C)  SpO2: 96% 97%   Wt Readings from Last 3 Encounters:  10/14/20 81.6 kg  05/15/20 86.1 kg  10/03/19 86.4 kg    General: Pleasant, well-appearing, comfortable, alert Head: No facial asymmetry or  swelling.  No signs of head trauma. Eyes: No scleral icterus.  No conjunctival pallor.  EOMI Ears: Not hard of hearing Nose: No congestion or discharge Mouth: Oral mucosa is moist, pink, clear.  Tongue midline.  Edentulous. Neck: No JVD, no masses, no thyromegaly Lungs: Clear bilaterally with excellent breath sounds.  No shortness of breath or cough Heart: RRR.  No MRG.  S1, S2 present. Abdomen: Soft without tenderness.  Active bowel sounds.  No HSM, masses, bruits, hernias.   Rectal: Slight visible hemorrhoids, not thrombosed.  No red blood visible.  DRE not performed. Musc/Skeltl: No joint redness, swelling or gross deformity Extremities: No CCE. Neurologic: Good historian alert.  Appropriate.  Oriented x3.  Moves all 4 limbs without tremor.  Strength not tested.  No gross neurologic deficits. Skin: No rash, no sores, no telangiectasia.  No significant purpura or bruising. Nodes: No cervical adenopathy Psych: Cooperative, calm, pleasant.  Fluid speech.  Intake/Output from previous day: 11/22 0701 - 11/23 0700 In: 1750.1 [I.V.:750; IV Piggyback:1000.1] Out: -  Intake/Output this shift: No intake/output data recorded.  LAB RESULTS: Recent Labs    10/14/20 2000 10/15/20 0226 10/15/20 0554  WBC 7.1 7.0 7.1  HGB 8.5* 8.5* 8.5*  HCT 28.9* 28.1* 27.6*  PLT  105* 100* 100*   BMET Lab Results  Component Value Date   NA 138 10/14/2020   NA 135 02/28/2019   NA 135 02/27/2019   K 3.5 10/14/2020   K 4.2 02/28/2019   K 4.1 02/27/2019   CL 106 10/14/2020   CL 107 02/28/2019   CL 103 02/27/2019   CO2 23 10/14/2020   CO2 21 (L) 02/28/2019   CO2 24 02/27/2019   GLUCOSE 226 (H) 10/14/2020   GLUCOSE 110 (H) 02/28/2019   GLUCOSE 117 (H) 02/27/2019   BUN 9 10/14/2020   BUN 12 02/28/2019   BUN 16 02/27/2019   CREATININE 0.77 10/14/2020   CREATININE 0.90 02/28/2019   CREATININE 0.91 02/27/2019   CALCIUM 7.5 (L) 10/14/2020   CALCIUM 8.5 (L) 02/28/2019   CALCIUM 8.6 (L) 02/27/2019    LFT Recent Labs    10/14/20 2000  PROT 5.3*  ALBUMIN 2.8*  AST 35  ALT 41  ALKPHOS 122  BILITOT 0.6   PT/INR Lab Results  Component Value Date   INR 1.7 (H) 10/14/2020     RADIOLOGY STUDIES: No results found.    IMPRESSION:    *   Rectal bleeding, recurrent.     *  Metastatic rectal carcinoma, ongoing palliative chemo.    *   Thrombocytopenia, chronic  *   Chronic Plavix, last dose 11/22.  Hx STEMI and 3 DES stent placement 02/2020.  On hold.     PLAN:     *   Flex sig, ? Timing given last dose Plavix was yesterday 11/22.    *   No need for patient to be NPO.  I ordered a soft, carb modified/heart healthy diet.   Azucena Freed  10/15/2020, 8:15 AM Phone [phone]

## 2020-10-15 NOTE — Progress Notes (Signed)
Received pt alert and oriented x 4. Pt ambulated to room with 1 assist. Oriented pt to plan of care.

## 2020-10-16 LAB — BASIC METABOLIC PANEL
Anion gap: 12 (ref 5–15)
BUN: 10 mg/dL (ref 8–23)
CO2: 22 mmol/L (ref 22–32)
Calcium: 8 mg/dL — ABNORMAL LOW (ref 8.9–10.3)
Chloride: 103 mmol/L (ref 98–111)
Creatinine, Ser: 0.82 mg/dL (ref 0.61–1.24)
GFR, Estimated: >60 mL/min (ref >60)
Glucose, Bld: 178 mg/dL — ABNORMAL HIGH (ref 70–99)
Potassium: 3.5 mmol/L (ref 3.5–5.1)
Sodium: 137 mmol/L (ref 135–145)

## 2020-10-16 LAB — TROPONIN I (HIGH SENSITIVITY)
Troponin I (High Sensitivity): 8 ng/L (ref <18)
Troponin I (High Sensitivity): 8 ng/L (ref <18)

## 2020-10-16 LAB — CBC
HCT: 22.4 % — ABNORMAL LOW (ref 39.0–52.0)
Hemoglobin: 6.8 g/dL — CL (ref 13.0–17.0)
MCH: 24.7 pg — ABNORMAL LOW (ref 26.0–34.0)
MCHC: 30.4 g/dL (ref 30.0–36.0)
MCV: 81.5 fL (ref 80.0–100.0)
Platelets: 88 10*3/uL — ABNORMAL LOW (ref 150–400)
RBC: 2.75 MIL/uL — ABNORMAL LOW (ref 4.22–5.81)
RDW: 15.7 % — ABNORMAL HIGH (ref 11.5–15.5)
WBC: 5.7 10*3/uL (ref 4.0–10.5)
nRBC: 0 % (ref 0.0–0.2)

## 2020-10-16 LAB — GLUCOSE, CAPILLARY
Glucose-Capillary: 104 mg/dL — ABNORMAL HIGH (ref 70–99)
Glucose-Capillary: 107 mg/dL — ABNORMAL HIGH (ref 70–99)
Glucose-Capillary: 141 mg/dL — ABNORMAL HIGH (ref 70–99)
Glucose-Capillary: 144 mg/dL — ABNORMAL HIGH (ref 70–99)
Glucose-Capillary: 158 mg/dL — ABNORMAL HIGH (ref 70–99)
Glucose-Capillary: 222 mg/dL — ABNORMAL HIGH (ref 70–99)

## 2020-10-16 LAB — PREPARE RBC (CROSSMATCH)

## 2020-10-16 LAB — HEMOGLOBIN AND HEMATOCRIT, BLOOD
HCT: 27.3 % — ABNORMAL LOW (ref 39.0–52.0)
HCT: 27.7 % — ABNORMAL LOW (ref 39.0–52.0)
Hemoglobin: 8.5 g/dL — ABNORMAL LOW (ref 13.0–17.0)
Hemoglobin: 8.9 g/dL — ABNORMAL LOW (ref 13.0–17.0)

## 2020-10-16 LAB — MAGNESIUM: Magnesium: 1.7 mg/dL (ref 1.7–2.4)

## 2020-10-16 MED ORDER — GABAPENTIN 300 MG PO CAPS
300.0000 mg | ORAL_CAPSULE | Freq: Every day | ORAL | Status: DC
  Administered 2020-10-16 – 2020-10-17 (×2): 300 mg via ORAL
  Filled 2020-10-16 (×2): qty 1

## 2020-10-16 MED ORDER — PANTOPRAZOLE SODIUM 40 MG IV SOLR: 40.0000 mg | Freq: Two times a day (BID) | INTRAVENOUS | Status: DC

## 2020-10-16 MED ORDER — SODIUM CHLORIDE 0.9 % IV BOLUS
250.0000 mL | Freq: Once | INTRAVENOUS | Status: AC
  Administered 2020-10-16: 250 mL via INTRAVENOUS

## 2020-10-16 MED ORDER — SODIUM CHLORIDE 0.9% IV SOLUTION: Freq: Once | INTRAVENOUS | Status: AC

## 2020-10-16 MED ORDER — NITROGLYCERIN 0.4 MG SL SUBL: 0.4000 mg | SUBLINGUAL_TABLET | SUBLINGUAL | Status: DC | PRN

## 2020-10-16 MED ORDER — PANTOPRAZOLE SODIUM 40 MG PO TBEC
40.0000 mg | DELAYED_RELEASE_TABLET | Freq: Every day | ORAL | Status: DC
  Administered 2020-10-16 – 2020-10-18 (×2): 40 mg via ORAL
  Filled 2020-10-16 (×2): qty 1

## 2020-10-16 NOTE — Telephone Encounter (Signed)
Admitted for rectal bleeding Plan flex sig with Dr. Baldwin Crown

## 2020-10-16 NOTE — Progress Notes (Signed)
PROGRESS NOTE    Dakota Bennett  MOQ:[id number] DOB: [birth year] DOA: 10/14/2020 PCP: Serita Grammes, MD   Brief Narrative: 74 year old with past medical history significant for rectal cancer with lung mets, hypertension, hyperlipidemia, diabetes type 2 GERD, hemorrhoids, mitral valve prolapse, CAD with a stent on Plavix, chronic diastolic heart failure, chronic thrombocytopenia who presents to the ED complaining of bright red blood per rectum that started the morning of admission.  Patient noticed large amount of bright red blood with dark blood clot mixed and.  He had 5 episode of rectal bleeding throughout the day which prompted him to the ED.  Reports mild dizziness.  Patient admitted with GI bleed, GI has been consulted.  Overnight he continued to have bloody bowel movement, hemoglobin dropped to 6.  He is receiving 2 units of packed red blood cell. transfer to progressive care unit    Assessment & Plan:   Principal Problem:   Rectal bleeding Active Problems:   History of rectal cancer   Diabetes mellitus type 2, noninsulin dependent (HCC)   Hyperlipidemia LDL goal <70   Acute on chronic blood loss anemia   Acute lower GI bleeding  1-Acute lower GI bleed, rectal bleeding: -Patient hemoglobin decreased to, he continued to have bloody stool. -Getting 2 units of packed red blood cell. -Check hemoglobin every 8 hours.  Future transfusion as needed. -GI informed of events from last night.  -Continue to hold Plavix -NPO. INR decreased to 1.2  2-Metastatic rectal cancer with mets to colon 2018 Follows in [place]. -CT abdomen and pelvis 08/2020 mild progression of pulmonary mets, new left mediastinal lymph node enlargement of the left lower lung nodule.  No recurrence of metastatic cancer in abdomen and pelvis Need to follow-up with primary oncologist  3-Diabetes type II: Hemoglobin A1c 8.7 Continue with a sliding scale insulin  4-Hypertension: Holding blood pressure  medication in the setting of GI bleed. As needed labetalol ordered  5-Thrombocytopenia: Chronic, stable    6-CAD status post stenting: Holding Plavix due to GI bleed.  7-Chronic diastolic heart failure: Holding Lasix due to GI bleed.         Estimated body mass index is 25.83 kg/m as calculated from the following:   Height as of this encounter: 5\' 10"  (1.778 m).   Weight as of this encounter: 81.6 kg.   DVT prophylaxis: SCDs Code Status: Full code Family Communication: Care discussed with patient Disposition Plan:  Status is: Inpatient  Remains inpatient appropriate because:Hemodynamically unstable   Dispo: The patient is from: Home              Anticipated d/c is to: Home              Anticipated d/c date is: 2 days              Patient currently is not medically stable to d/c.        Consultants:   GI  Procedures:   None  Antimicrobials:  None  Subjective: Had a bloody bowel movement 3 4 in the morning.  He was feeling lightheaded.  His hemoglobin dropped to 6.  He was getting 1 unit of packed red blood cell during my evaluation. Denies abdominal pain.  Objective: Vitals:   10/15/20 2010 10/16/20 0125 10/16/20 0435 10/16/20 0507  BP: (!) 126/56 94/67 140/67 (!) 155/59  Pulse: 91 92 (!) 101 93  Resp: 19 20 20 20   Temp: 97.7 F (36.5 C) 97.8 F (36.6 C) 98.1 F (36.7  C) 97.8 F (36.6 C)  TempSrc:  Oral Oral Oral  SpO2: 98%  96% 97%  Weight:      Height:        Intake/Output Summary (Last 24 hours) at 10/16/2020 0758 Last data filed at 10/15/2020 2010 Gross per 24 hour  Intake 510 ml  Output --  Net 510 ml   Filed Weights   10/14/20 1920  Weight: 81.6 kg    Examination:  General exam: Appears calm and comfortable  Respiratory system: Clear to auscultation. Respiratory effort normal. Cardiovascular system: S1 & S2 heard, RRR. No JVD, murmurs, rubs, gallops or clicks. No pedal edema. Gastrointestinal system: Abdomen is  nondistended, soft and nontender. No organomegaly or masses felt. Normal bowel sounds heard. Central nervous system: Alert and oriented.  Extremities: Symmetric 5 x 5 power.    Data Reviewed: I have personally reviewed following labs and imaging studies  CBC: Recent Labs  Lab 10/14/20 2000 10/15/20 0226 10/15/20 0554 10/15/20 1228 10/16/20 0315  WBC 7.1 7.0 7.1 5.2 5.7  NEUTROABS 5.2  --   --   --   --   HGB 8.5* 8.5* 8.5* 8.0* 6.8*  HCT 28.9* 28.1* 27.6* 25.4* 22.4*  MCV 82.8 80.7 79.1* 80.1 81.5  PLT 105* 100* 100* 91* 88*   Basic Metabolic Panel: Recent Labs  Lab 10/14/20 2000 10/15/20 1228 10/16/20 0315  NA 138 138 137  K 3.5 3.7 3.5  CL 106 104 103  CO2 23 23 22   GLUCOSE 226* 259* 178*  BUN 9 10 10   CREATININE 0.77 0.83 0.82  CALCIUM 7.5* 8.2* 8.0*  MG  --   --  1.7   GFR: Estimated Creatinine Clearance: 84.1 mL/min (by C-G formula based on SCr of 0.82 mg/dL). Liver Function Tests: Recent Labs  Lab 10/14/20 2000  AST 35  ALT 41  ALKPHOS 122  BILITOT 0.6  PROT 5.3*  ALBUMIN 2.8*   No results for input(s): LIPASE, AMYLASE in the last 168 hours. No results for input(s): AMMONIA in the last 168 hours. Coagulation Profile: Recent Labs  Lab 10/14/20 2000 10/15/20 1228  INR 1.7* 1.2   Cardiac Enzymes: No results for input(s): CKTOTAL, CKMB, CKMBINDEX, TROPONINI in the last 168 hours. BNP (last 3 results) No results for input(s): PROBNP in the last 8760 hours. HbA1C: Recent Labs    10/15/20 0226  HGBA1C 8.7*   CBG: Recent Labs  Lab 10/15/20 1146 10/15/20 1619 10/15/20 2022 10/16/20 0001 10/16/20 0418  GLUCAP 140* 150* 122* 141* 158*   Lipid Profile: No results for input(s): CHOL, HDL, LDLCALC, TRIG, CHOLHDL, LDLDIRECT in the last 72 hours. Thyroid Function Tests: No results for input(s): TSH, T4TOTAL, FREET4, T3FREE, THYROIDAB in the last 72 hours. Anemia Panel: No results for input(s): VITAMINB12, FOLATE, FERRITIN, TIBC, IRON,  RETICCTPCT in the last 72 hours. Sepsis Labs: No results for input(s): PROCALCITON, LATICACIDVEN in the last 168 hours.  Recent Results (from the past 240 hour(s))  Respiratory Panel by RT PCR (Flu A&B, Covid) - Nasopharyngeal Swab     Status: None   Collection Time: 10/15/20  3:14 PM   Specimen: Nasopharyngeal Swab; Nasopharyngeal(NP) swabs in vial transport medium  Result Value Ref Range Status   SARS Coronavirus 2 by RT PCR NEGATIVE NEGATIVE Final    Comment: (NOTE) SARS-CoV-2 target nucleic acids are NOT DETECTED.  The SARS-CoV-2 RNA is generally detectable in upper respiratoy specimens during the acute phase of infection. The lowest concentration of SARS-CoV-2 viral copies this assay can  detect is 131 copies/mL. A negative result does not preclude SARS-Cov-2 infection and should not be used as the sole basis for treatment or other patient management decisions. A negative result may occur with  improper specimen collection/handling, submission of specimen other than nasopharyngeal swab, presence of viral mutation(s) within the areas targeted by this assay, and inadequate number of viral copies (<131 copies/mL). A negative result must be combined with clinical observations, patient history, and epidemiological information. The expected result is Negative.  Fact Sheet for Patients:  PinkCheek.be  Fact Sheet for Healthcare Providers:  GravelBags.it  This test is no t yet approved or cleared by the [place] FDA and  has been authorized for detection and/or diagnosis of SARS-CoV-2 by FDA under an Emergency Use Authorization (EUA). This EUA will remain  in effect (meaning this test can be used) for the duration of the COVID-19 declaration under Section 564(b)(1) of the Act, 21 U.S.C. section 360bbb-3(b)(1), unless the authorization is terminated or revoked sooner.     Influenza A by PCR NEGATIVE NEGATIVE Final    Influenza B by PCR NEGATIVE NEGATIVE Final    Comment: (NOTE) The Xpert Xpress SARS-CoV-2/FLU/RSV assay is intended as an aid in  the diagnosis of influenza from Nasopharyngeal swab specimens and  should not be used as a sole basis for treatment. Nasal washings and  aspirates are unacceptable for Xpert Xpress SARS-CoV-2/FLU/RSV  testing.  Fact Sheet for Patients: PinkCheek.be  Fact Sheet for Healthcare Providers: GravelBags.it  This test is not yet approved or cleared by the [place] FDA and  has been authorized for detection and/or diagnosis of SARS-CoV-2 by  FDA under an Emergency Use Authorization (EUA). This EUA will remain  in effect (meaning this test can be used) for the duration of the  Covid-19 declaration under Section 564(b)(1) of the Act, 21  U.S.C. section 360bbb-3(b)(1), unless the authorization is  terminated or revoked. Performed at [facility], [place] [address] [place], [place] [address]          Radiology Studies: No results found.      Scheduled Meds: . sodium chloride   Intravenous Once  . atorvastatin  80 mg Oral q1800  . Chlorhexidine Gluconate Cloth  6 each Topical Daily  . ezetimibe  10 mg Oral Daily  . insulin aspart  0-9 Units Subcutaneous TID WC  . pantoprazole  40 mg Oral Daily  . sodium chloride flush  10-40 mL Intracatheter Q12H   Continuous Infusions:   LOS: 1 day    Time spent: 35 minutes.     Elmarie Shiley, MD [facility]   If 7PM-7AM, please contact night-coverage [email or web address]  10/16/2020, 7:58 AM

## 2020-10-16 NOTE — Progress Notes (Signed)
Pt to be transferred to [address], report given to [place], RN for that room. Pt is currently receiving blood and is without any reaction to the unit, has eaten CL diet and tolerated it well.  Wife at bedside and knows the room #.

## 2020-10-16 NOTE — Progress Notes (Signed)
° °         Daily Rounding Note  10/16/2020, 9:08 AM  LOS: 1 day   SUBJECTIVE:   Chief complaint: painless hematochezia.       Additional episodes hematochezia overnight between midnight and 2 or 3 AM.  large amounts of blood.  Persistent rectal discomfort predates the bleeding.  Felt dizzy, weak.  BPs as low as 94/67 at 0130, up to 155/59 now. HR to 101 max, 93 now.  Getting 2 PRBCs for Hgb drop to 6.8.  Platelets down to 88 K.    OBJECTIVE:         Vital signs in last 24 hours:    Temp:  [97.7 F (36.5 C)-98.1 F (36.7 C)] 97.8 F (36.6 C) (11/24 0507) Pulse Rate:  [80-101] 93 (11/24 0507) Resp:  [18-20] 20 (11/24 0507) BP: (94-155)/(56-67) 155/59 (11/24 0507) SpO2:  [96 %-98 %] 97 % (11/24 0507) Last BM Date: 10/16/20 Filed Weights   10/14/20 1920  Weight: 81.6 kg   General: looks well.     Heart: Irreg, irreg.  Monitor looks like MSR with frequent PVCs.  Chest: clear bil.  No SOB or cough Abdomen: soft, NT, ND.  Active BS  Extremities: no CCE Neuro/Psych:  Oriented x 3.  No deficits.  Fluid speech.  Intake/Output from previous day: 11/23 0701 - 11/24 0700 In: 510 [P.O.:510] Out: -   Intake/Output this shift: No intake/output data recorded.  Lab Results: Recent Labs    10/15/20 0554 10/15/20 1228 10/16/20 0315  WBC 7.1 5.2 5.7  HGB 8.5* 8.0* 6.8*  HCT 27.6* 25.4* 22.4*  PLT 100* 91* 88*   BMET Recent Labs    10/14/20 2000 10/15/20 1228 10/16/20 0315  NA 138 138 137  K 3.5 3.7 3.5  CL 106 104 103  CO2 23 23 22   GLUCOSE 226* 259* 178*  BUN 9 10 10   CREATININE 0.77 0.83 0.82  CALCIUM 7.5* 8.2* 8.0*   LFT Recent Labs    10/14/20 2000  PROT 5.3*  ALBUMIN 2.8*  AST 35  ALT 41  ALKPHOS 122  BILITOT 0.6   PT/INR Recent Labs    10/14/20 2000 10/15/20 1228  LABPROT 18.9* 14.5  INR 1.7* 1.2     ASSESMENT:   *    Painless rectal bleeding.  History radiation proctitis on flex sig  01/2019. R/o rads proctitis, r/o hemorrhoidal bleeding (though volume more than usual for rrhoid bleed), r/o recurrent rectal tumor, r/o diverticular bleed.    *    Metastatic rectal cancer.  Treated with palliative radiation/chemo.  Treatments completed in 03/2019.  No evidence for recurrent cancer on flex sig 01/2019.  *    Acute blood loss anemia.  Hgb 8.5 >> 8 >> 6.8 >> 2 PRBCs.    *     Acute on chronic thrombocytopenia.  *    Chronic Plavix following NSTEMI and DES in 02/2019.  plavix on hold last dose 11/22.     PLAN   *   Plan is to try to wait for 5 d of Plavix washout before proceeding to Flex sigmoidoscopy  ? Pursue IR CTAP w angio and aim for embolization?  Allow clears.      Azucena Freed  10/16/2020, 9:08 AM Phone [phone]

## 2020-10-16 NOTE — Progress Notes (Addendum)
    BRIEF OVERNIGHT PROGRESS REPORT   SUBJECTIVE:Patient c/o chest pain, dizziness and near syncope while attempting to use the bathroom. Per RN, patient has had total of 3 rectal bleeds with clots this shift. Chest pain resolved spontaneously.   OBJECTIVE: On assessment, he was afebrile with blood pressure 94/67 mm Hg and pulse rate 92 beats/min. There were no focal neurological deficits; he was alert and oriented x4, and he did not demonstrate any memory deficits.    ASSESSMENT & PLAN:  Recurrent Rectal bleed: BSW:[id number], AVM, malignancy, hemorrhoids in a patient with hx of of metastatic rectal cancer s/p radiation and palliative chemotherapy.  GI suspect radiation proctitis noted on flexible sigmoidoscopy 01/2019 STAT CCB obtained and showed Hgb drop from 8.5 to 6.8 this am  Plan - Transfuse 1 unit PRBCs for now, Hold 1 unit - At least 2x IV access, 18 gauge or larger - IVF resuscitation to maintain MAP>65 - H&H monitoring q6h - Blood Consent.  Transfuse PRN Hgb<7 (Massive Transfusion Protocol option or empiric FFP+Platelets if needing more than 2-4 units PRBC) - Pantoprazole 80mg  IV x1 then gtt 8mg /hr - NPO  - GI Consult for plan for flexible sigmoidoscopy - Hold NSAIDs, steroids, ASA      Rufina Falco, DNP, CCRN, FNP-C [facility] Nurse Practitioner Between 7pm to [employer] - Pager [phone]

## 2020-10-16 NOTE — Progress Notes (Signed)
Received critical lab result hemoglobin-6.8 Text page oncall E Ouma,NP with new orders received to give 1 unit PRBC.

## 2020-10-16 NOTE — Progress Notes (Signed)
Pt to be transferred to a Progressive dept. No shortness of breath, chest pain this am, pt to receive an additional unit of blood this am.

## 2020-10-16 NOTE — H&P (View-Only) (Signed)
          Daily Rounding Note  10/16/2020, 9:08 AM  LOS: 1 day   SUBJECTIVE:   Chief complaint: painless hematochezia.       Additional episodes hematochezia overnight between midnight and 2 or 3 AM.  large amounts of blood.  Persistent rectal discomfort predates the bleeding.  Felt dizzy, weak.  BPs as low as 94/67 at 0130, up to 155/59 now. HR to 101 max, 93 now.  Getting 2 PRBCs for Hgb drop to 6.8.  Platelets down to 88 K.    OBJECTIVE:         Vital signs in last 24 hours:    Temp:  [97.7 F (36.5 C)-98.1 F (36.7 C)] 97.8 F (36.6 C) (11/24 0507) Pulse Rate:  [80-101] 93 (11/24 0507) Resp:  [18-20] 20 (11/24 0507) BP: (94-155)/(56-67) 155/59 (11/24 0507) SpO2:  [96 %-98 %] 97 % (11/24 0507) Last BM Date: 10/16/20 Filed Weights   10/14/20 1920  Weight: 81.6 kg   General: looks well.     Heart: Irreg, irreg.  Monitor looks like MSR with frequent PVCs.  Chest: clear bil.  No SOB or cough Abdomen: soft, NT, ND.  Active BS  Extremities: no CCE Neuro/Psych:  Oriented x 3.  No deficits.  Fluid speech.  Intake/Output from previous day: 11/23 0701 - 11/24 0700 In: 510 [P.O.:510] Out: -   Intake/Output this shift: No intake/output data recorded.  Lab Results: Recent Labs    10/15/20 0554 10/15/20 1228 10/16/20 0315  WBC 7.1 5.2 5.7  HGB 8.5* 8.0* 6.8*  HCT 27.6* 25.4* 22.4*  PLT 100* 91* 88*   BMET Recent Labs    10/14/20 2000 10/15/20 1228 10/16/20 0315  NA 138 138 137  K 3.5 3.7 3.5  CL 106 104 103  CO2 23 23 22   GLUCOSE 226* 259* 178*  BUN 9 10 10   CREATININE 0.77 0.83 0.82  CALCIUM 7.5* 8.2* 8.0*   LFT Recent Labs    10/14/20 2000  PROT 5.3*  ALBUMIN 2.8*  AST 35  ALT 41  ALKPHOS 122  BILITOT 0.6   PT/INR Recent Labs    10/14/20 2000 10/15/20 1228  LABPROT 18.9* 14.5  INR 1.7* 1.2     ASSESMENT:   *    Painless rectal bleeding.  History radiation proctitis on flex sig  01/2019. R/o rads proctitis, r/o hemorrhoidal bleeding (though volume more than usual for rrhoid bleed), r/o recurrent rectal tumor, r/o diverticular bleed.    *    Metastatic rectal cancer.  Treated with palliative radiation/chemo.  Treatments completed in 03/2019.  No evidence for recurrent cancer on flex sig 01/2019.  *    Acute blood loss anemia.  Hgb 8.5 >> 8 >> 6.8 >> 2 PRBCs.    *     Acute on chronic thrombocytopenia.  *    Chronic Plavix following NSTEMI and DES in 02/2019.  plavix on hold last dose 11/22.     PLAN   *   Plan is to try to wait for 5 d of Plavix washout before proceeding to Flex sigmoidoscopy  ? Pursue IR CTAP w angio and aim for embolization?  Allow clears.      Dakota Bennett  10/16/2020, 9:08 AM Phone [phone]

## 2020-10-17 ENCOUNTER — Other Ambulatory Visit: Payer: Self-pay

## 2020-10-17 ENCOUNTER — Inpatient Hospital Stay (HOSPITAL_COMMUNITY): Payer: Medicare Other | Admitting: Certified Registered Nurse Anesthetist

## 2020-10-17 ENCOUNTER — Encounter (HOSPITAL_COMMUNITY)
Admission: EM | Disposition: A | Discharge status: Home / Self Care | Payer: Self-pay | Source: Home / Self Care | Attending: Internal Medicine

## 2020-10-17 ENCOUNTER — Encounter (HOSPITAL_COMMUNITY): Payer: Self-pay | Admitting: Internal Medicine

## 2020-10-17 DIAGNOSIS — C20 Malignant neoplasm of rectum: Principal | ICD-10-CM

## 2020-10-17 DIAGNOSIS — K625 Hemorrhage of anus and rectum: Secondary | ICD-10-CM | POA: Diagnosis not present

## 2020-10-17 DIAGNOSIS — K921 Melena: Secondary | ICD-10-CM

## 2020-10-17 DIAGNOSIS — K626 Ulcer of anus and rectum: Secondary | ICD-10-CM

## 2020-10-17 HISTORY — PX: BIOPSY: SHX5522

## 2020-10-17 HISTORY — PX: HEMOSTASIS CLIP PLACEMENT: SHX6857

## 2020-10-17 HISTORY — PX: FLEXIBLE SIGMOIDOSCOPY: SHX5431

## 2020-10-17 LAB — TYPE AND SCREEN
ABO/RH(D): O POS
Antibody Screen: NEGATIVE
Unit division: 0
Unit division: 0

## 2020-10-17 LAB — BPAM RBC
Blood Product Expiration Date: 202111272359
Blood Product Expiration Date: 202112252359
ISSUE DATE / TIME: 202111240445
ISSUE DATE / TIME: 202111241025
Unit Type and Rh: 5100
Unit Type and Rh: 5100

## 2020-10-17 LAB — BASIC METABOLIC PANEL
Anion gap: 8 (ref 5–15)
BUN: 6 mg/dL — ABNORMAL LOW (ref 8–23)
CO2: 24 mmol/L (ref 22–32)
Calcium: 8.3 mg/dL — ABNORMAL LOW (ref 8.9–10.3)
Chloride: 107 mmol/L (ref 98–111)
Creatinine, Ser: 0.8 mg/dL (ref 0.61–1.24)
GFR, Estimated: >60 mL/min (ref >60)
Glucose, Bld: 142 mg/dL — ABNORMAL HIGH (ref 70–99)
Potassium: 3.6 mmol/L (ref 3.5–5.1)
Sodium: 139 mmol/L (ref 135–145)

## 2020-10-17 LAB — GLUCOSE, CAPILLARY
Glucose-Capillary: 98 mg/dL (ref 70–99)
Glucose-Capillary: 135 mg/dL — ABNORMAL HIGH (ref 70–99)
Glucose-Capillary: 115 mg/dL — ABNORMAL HIGH (ref 70–99)
Glucose-Capillary: 161 mg/dL — ABNORMAL HIGH (ref 70–99)
Glucose-Capillary: 106 mg/dL — ABNORMAL HIGH (ref 70–99)

## 2020-10-17 LAB — CBC
HCT: 28.5 % — ABNORMAL LOW (ref 39.0–52.0)
Hemoglobin: 9 g/dL — ABNORMAL LOW (ref 13.0–17.0)
MCH: 25.7 pg — ABNORMAL LOW (ref 26.0–34.0)
MCHC: 31.6 g/dL (ref 30.0–36.0)
MCV: 81.4 fL (ref 80.0–100.0)
Platelets: 98 10*3/uL — ABNORMAL LOW (ref 150–400)
RBC: 3.5 MIL/uL — ABNORMAL LOW (ref 4.22–5.81)
RDW: 15.6 % — ABNORMAL HIGH (ref 11.5–15.5)
WBC: 5.4 10*3/uL (ref 4.0–10.5)
nRBC: 0 % (ref 0.0–0.2)

## 2020-10-17 SURGERY — SIGMOIDOSCOPY, FLEXIBLE
Anesthesia: Monitor Anesthesia Care

## 2020-10-17 MED ORDER — METOPROLOL SUCCINATE ER 100 MG PO TB24: 100.0000 mg | ORAL_TABLET | Freq: Every day | ORAL | Status: DC

## 2020-10-17 MED ORDER — HYDRALAZINE HCL 25 MG PO TABS
25.0000 mg | ORAL_TABLET | Freq: Every day | ORAL | Status: DC
  Administered 2020-10-17: 25 mg via ORAL
  Filled 2020-10-17: qty 1

## 2020-10-17 MED ORDER — PROPOFOL 500 MG/50ML IV EMUL
INTRAVENOUS | Status: DC | PRN
  Administered 2020-10-17: 100 ug/kg/min via INTRAVENOUS

## 2020-10-17 MED ORDER — METOPROLOL SUCCINATE ER 100 MG PO TB24
100.0000 mg | ORAL_TABLET | Freq: Every day | ORAL | Status: DC
  Filled 2020-10-17: qty 1

## 2020-10-17 MED ORDER — POLYETHYLENE GLYCOL 3350 17 G PO PACK: 17.0000 g | PACK | Freq: Every day | ORAL | Status: DC

## 2020-10-17 MED ORDER — METOPROLOL SUCCINATE ER 100 MG PO TB24
100.0000 mg | ORAL_TABLET | Freq: Once | ORAL | Status: AC
  Administered 2020-10-17: 100 mg via ORAL
  Filled 2020-10-17: qty 1

## 2020-10-17 MED ORDER — PROPOFOL 10 MG/ML IV BOLUS
INTRAVENOUS | Status: DC | PRN
  Administered 2020-10-17 (×2): 20 mg via INTRAVENOUS

## 2020-10-17 MED ORDER — ISOSORBIDE MONONITRATE ER 30 MG PO TB24
30.0000 mg | ORAL_TABLET | Freq: Every day | ORAL | Status: DC
  Administered 2020-10-17: 30 mg via ORAL
  Filled 2020-10-17: qty 1

## 2020-10-17 MED ORDER — LACTATED RINGERS IV SOLN: INTRAVENOUS | Status: DC | PRN

## 2020-10-17 NOTE — Op Note (Signed)
[facility] Patient Name: Dakota Bennett Procedure Date : 10/17/2020 MRN: [id number] Attending MD: Gerrit Heck , MD Date of Birth: 14-Jun-1946 CSN: [id number] Age: 74 Admit Type: Inpatient Procedure:                Flexible Sigmoidoscopy Indications:              Hematochezia, Acute blood loss anemia, Symptomatic                            anemia Providers:                Gerrit Heck, MD, Vista Lawman, RN, Laverda Sorenson, Technician, Benetta Spar, Technician,                            Clearnce Sorrel, CRNA Referring MD:              Medicines:                Monitored Anesthesia Care Complications:            No immediate complications. Estimated Blood Loss:     Estimated blood loss was minimal. Procedure:                Pre-Anesthesia Assessment:                           - Prior to the procedure, a History and Physical                            was performed, and patient medications and                            allergies were reviewed. The patient's tolerance of                            previous anesthesia was also reviewed. The risks                            and benefits of the procedure and the sedation                            options and risks were discussed with the patient.                            All questions were answered, and informed consent                            was obtained. Prior Anticoagulants: The patient has                            taken Plavix (clopidogrel), last dose was 3 days                            prior to procedure.  ASA Grade Assessment: III - A                            patient with severe systemic disease. After                            reviewing the risks and benefits, the patient was                            deemed in satisfactory condition to undergo the                            procedure.                           After obtaining informed consent, the scope was                             passed under direct vision. The [id number]-[id number] ([id number])                            Olympus gastroscope was introduced through the anus                            and advanced to the the sigmoid colon. The flexible                            sigmoidoscopy was accomplished without difficulty.                            The patient tolerated the procedure well. The                            quality of the bowel preparation was adequate. Scope In: Scope Out: Findings:      Hemorrhoids were found on perianal exam.      A single small ulcer with a prominent visble vessel was found in the       distal rectum. For hemostasis, one hemostatic clip was successfully       placed. There was no bleeding at the end of the procedure.      A fungating and ulcerated non-obstructing mass was found in the rectum.       The mass was circumferential. The mass measured five cm in length. There       was mild contact oozing was present. This was biopsied with a cold       forceps for histology. Estimated blood loss was minimal. The mass was       traversable. The sigmoid colon mucosa proximal to the mass was otherwise       normal appearing. No blood noted in the lumen proximal to the mass.      Non-bleeding internal hemorrhoids were found during retroflexion. The       hemorrhoids were small and Grade II (internal hemorrhoids that prolapse       but reduce spontaneously). Impression:               - Hemorrhoids found  on perianal exam.                           - A single (solitary) ulcer with a visible vessel                            was noted in the distal rectum. Cannot be sure if                            this was the source of recent acute bleeding vs                            active oozing from the mass, but given clinical                            presentation and high grade appearance, a                            hemostatic clip was placed.                           - Malignant tumor in the  rectum. Biopsied.                           - Non-bleeding internal hemorrhoids. Recommendation:           - Return patient to hospital ward for ongoing care.                           - Advance diet as tolerated today.                           - Continue present medications.                           - Await pathology results.                           - Will need to re-engage with his Oncologist to                            discuss treatment options of rectal tumor                            recurrence.                           - Ok to resume Plavix tomorrow given risk:benefit                            profile.                           - Continue serial CBC checks for now and monitor  for active overt rebleeding. Procedure Code(s):        --- Professional ---                           [phone], 74, Sigmoidoscopy, flexible; with control of                            bleeding, any method                           45331, Sigmoidoscopy, flexible; with biopsy, single                            or multiple Diagnosis Code(s):        --- Professional ---                           K64.1, Second degree hemorrhoids                           K62.6, Ulcer of anus and rectum                           C20, Malignant neoplasm of rectum                           K92.1, Melena (includes Hematochezia) CPT copyright 2019 [employer]. All rights reserved. The codes documented in this report are preliminary and upon coder review may  be revised to meet current compliance requirements. Gerrit Heck, MD 10/17/2020 9:51:37 AM Number of Addenda: 0

## 2020-10-17 NOTE — Anesthesia Preprocedure Evaluation (Signed)
Anesthesia Evaluation  Patient identified by MRN, date of birth, ID band Patient awake    Reviewed: Allergy & Precautions, NPO status , Patient's Chart, lab work & pertinent test results  History of Anesthesia Complications Negative for: history of anesthetic complications  Airway Mallampati: II  TM Distance: >3 FB Neck ROM: Full    Dental  (+) Edentulous Upper, Edentulous Lower   Pulmonary neg shortness of breath, neg sleep apnea, neg COPD, neg recent URI, former smoker,  Covid-19 Nucleic Acid Test Results Lab Results      Component                Value               Date                      SARSCOV2NAA              NEGATIVE            10/15/2020              breath sounds clear to auscultation       Cardiovascular hypertension, Pt. on medications and Pt. on home beta blockers (-) angina+ CAD, + Past MI and + Cardiac Stents   Rhythm:Irregular  1. Left ventricular ejection fraction, by estimation, is 60 to 65%. The  left ventricle has normal function. The left ventricle has no regional  wall motion abnormalities. There is mild concentric left ventricular  hypertrophy. Left ventricular diastolic  parameters are consistent with Grade II diastolic dysfunction  (pseudonormalization).  2. Right ventricular systolic function is normal. The right ventricular  size is normal. There is normal pulmonary artery systolic pressure.  3. Left atrial size was mildly dilated.  4. The mitral valve is normal in structure. Trivial mitral valve  regurgitation. No evidence of mitral stenosis.  5. The aortic valve is normal in structure. Aortic valve regurgitation is  not visualized. No aortic stenosis is present.  6. The inferior vena cava is normal in size with greater than 50%  respiratory variability, suggesting right atrial pressure of 3 mmHg.    Prox LAD lesion is 70% stenosed.  Ost 1st Mrg lesion is 50% stenosed.  RPDA lesion is  80% stenosed.  Prox RCA to Mid RCA lesion is 90% stenosed.  Post intervention, there is a 0% residual stenosis.  A drug-eluting stent was successfully placed using a STENT RESOLUTE ONYX 3.5X38.  A drug-eluting stent was successfully placed using a STENT RESOLUTE [id number].  A drug-eluting stent was successfully placed using a STENT RESOLUTE ONYX 2.5X18.  The left ventricular systolic function is normal.  LV end diastolic pressure is normal.  The left ventricular ejection fraction is 55-65% by visual estimate.   1. 2 vessel obstructive CAD    - 70% mid LAD-heavily calcified    - 90% segmental stenosis in the mid RCA    - 80% mid PDA 2. Normal LV function 3. Normal LVEDP 4. Successful PCI of the RCA. Procedure complicated by acute dissection and vessel closure. This was successfully managed with DES x 3.     Neuro/Psych  Headaches, negative psych ROS   GI/Hepatic Neg liver ROS, GERD  Medicated,? GI bleed   Endo/Other  diabetes  Renal/GU Lab Results      Component                Value  Date                      CREATININE               0.80                10/17/2020                Musculoskeletal   Abdominal   Peds  Hematology  (+) Blood dyscrasia, anemia , Lab Results      Component                Value               Date                      WBC                      5.4                 10/17/2020                HGB                      9.0 (L)             10/17/2020                HCT                      28.5 (L)            10/17/2020                MCV                      81.4                10/17/2020                PLT                      98 (L)              10/17/2020              Anesthesia Other Findings   Reproductive/Obstetrics                             Anesthesia Physical Anesthesia Plan  ASA: III  Anesthesia Plan: MAC   Post-op Pain Management:    Induction: Intravenous  PONV Risk Score and  Plan: 1 and Propofol infusion and Treatment may vary due to age or medical condition  Airway Management Planned: Nasal Cannula  Additional Equipment: None  Intra-op Plan:   Post-operative Plan:   Informed Consent: I have reviewed the patients History and Physical, chart, labs and discussed the procedure including the risks, benefits and alternatives for the proposed anesthesia with the patient or authorized representative who has indicated his/her understanding and acceptance.     Dental advisory given  Plan Discussed with: CRNA and Surgeon  Anesthesia Plan Comments:         Anesthesia Quick Evaluation

## 2020-10-17 NOTE — Interval H&P Note (Signed)
History and Physical Interval Note:  10/17/2020 9:19 AM  Verita Schneiders  has presented today for surgery, with the diagnosis of rectal bleeding.  The various methods of treatment have been discussed with the patient and family. After consideration of risks, benefits and other options for treatment, the patient has consented to  Procedure(s): FLEXIBLE SIGMOIDOSCOPY (N/A) as a surgical intervention.  The patient's history has been reviewed, patient examined, no change in status, stable for surgery.  I have reviewed the patient's chart and labs.  Questions were answered to the patient's satisfaction.     Dominic Pea Arturo Freundlich

## 2020-10-17 NOTE — Progress Notes (Signed)
PROGRESS NOTE    Dakota Bennett  HCW:[id number] DOB: February 03, 1946 DOA: 10/14/2020 PCP: Serita Grammes, MD   Brief Narrative: 74 year old with past medical history significant for rectal cancer with lung mets, hypertension, hyperlipidemia, diabetes type 2 GERD, hemorrhoids, mitral valve prolapse, CAD with a stent on Plavix, chronic diastolic heart failure, chronic thrombocytopenia who presents to the ED complaining of bright red blood per rectum that started the morning of admission.  Patient noticed large amount of bright red blood with dark blood clot mixed and.  He had 5 episode of rectal bleeding throughout the day which prompted him to the ED.  Reports mild dizziness.  Patient admitted with GI bleed, GI has been consulted.  Overnight he continued to have bloody bowel movement, hemoglobin dropped to 6.  He is receiving 2 units of packed red blood cell. transfer to progressive care unit    Assessment & Plan:   Principal Problem:   Rectal bleeding Active Problems:   History of rectal cancer   Diabetes mellitus type 2, noninsulin dependent (HCC)   Hyperlipidemia LDL goal <70   Acute on chronic blood loss anemia   Acute lower GI bleeding  1-Acute lower GI bleed, rectal bleeding: -Patient hemoglobin decreased to 6, presented with bloody stool.  -Received  2 units of packed red blood cell 11/24. -Continue to hold Plavix -plan for sigmoidoscopy this am.  -hb stable at 9.   2-Metastatic rectal cancer with mets to colon 2018 -Follows in [place]. -CT abdomen and pelvis 08/2020 mild progression of pulmonary mets, new left mediastinal lymph node enlargement of the left lower lung nodule.  No recurrence of metastatic cancer in abdomen and pelvis Need to follow-up with primary oncologist  3-Diabetes type II: Hemoglobin A1c 8.7 Continue with a sliding scale insulin  4-Hypertension: Holding blood pressure medication in the setting of GI bleed. As needed labetalol  ordered  5-Thrombocytopenia: Chronic, remain stable.   6-CAD status post stenting: Holding Plavix due to GI bleed.  7-Chronic diastolic heart failure: Holding Lasix due to GI bleed.        Estimated body mass index is 25.56 kg/m as calculated from the following:   Height as of this encounter: 5\' 10"  (1.778 m).   Weight as of this encounter: 80.8 kg.   DVT prophylaxis: SCDs Code Status: Full code Family Communication: Care discussed with patient and wife who was at bedside.  Disposition Plan:  Status is: Inpatient  Remains inpatient appropriate because:Hemodynamically unstable   Dispo: The patient is from: Home              Anticipated d/c is to: Home              Anticipated d/c date is: 2 days              Patient currently is not medically stable to d/c.        Consultants:   GI  Procedures:   None  Antimicrobials:  None  Subjective: No more bloody bowel movement since yesterday morning.  Denies abdominal pain.   Objective: Vitals:   10/16/20 1555 10/16/20 2021 10/17/20 0133 10/17/20 0652  BP: (!) 158/52 (!) 157/47 (!) 153/62 (!) 147/67  Pulse: (!) 43 (!) 44 96 75  Resp: 16 12 17 13   Temp: 98 F (36.7 C) 97.6 F (36.4 C) 98.9 F (37.2 C) 98.3 F (36.8 C)  TempSrc: Oral Oral Oral Oral  SpO2: 97% 95% 94% 97%  Weight:    80.8 kg  Height:  Intake/Output Summary (Last 24 hours) at 10/17/2020 0743 Last data filed at 10/16/2020 1317 Gross per 24 hour  Intake 1701 ml  Output 400 ml  Net 1301 ml   Filed Weights   10/14/20 1920 10/17/20 0652  Weight: 81.6 kg 80.8 kg    Examination:  General exam: NAD Respiratory system: CTA Cardiovascular system: S 1, S 2 RRR Gastrointestinal system: BS present, soft, nt Central nervous system: Alert Extremities: symmetric power.     Data Reviewed: I have personally reviewed following labs and imaging studies  CBC: Recent Labs  Lab 10/14/20 2000 10/14/20 2000 10/15/20 0226  10/15/20 0226 10/15/20 0554 10/15/20 0554 10/15/20 1228 10/16/20 0315 10/16/20 1225 10/16/20 2217 10/17/20 0706  WBC 7.1   < > 7.0  --  7.1  --  5.2 5.7  --   --  5.4  NEUTROABS 5.2  --   --   --   --   --   --   --   --   --   --   HGB 8.5*   < > 8.5*   < > 8.5*   < > 8.0* 6.8* 8.5* 8.9* 9.0*  HCT 28.9*   < > 28.1*   < > 27.6*   < > 25.4* 22.4* 27.3* 27.7* 28.5*  MCV 82.8   < > 80.7  --  79.1*  --  80.1 81.5  --   --  81.4  PLT 105*   < > 100*  --  100*  --  91* 88*  --   --  98*   < > = values in this interval not displayed.   Basic Metabolic Panel: Recent Labs  Lab 10/14/20 2000 10/15/20 1228 10/16/20 0315 10/17/20 0706  NA 138 138 137 139  K 3.5 3.7 3.5 3.6  CL 106 104 103 107  CO2 23 23 22 24   GLUCOSE 226* 259* 178* 142*  BUN 9 10 10  6*  CREATININE 0.77 0.83 0.82 0.80  CALCIUM 7.5* 8.2* 8.0* 8.3*  MG  --   --  1.7  --    GFR: Estimated Creatinine Clearance: 86.2 mL/min (by C-G formula based on SCr of 0.8 mg/dL). Liver Function Tests: Recent Labs  Lab 10/14/20 2000  AST 35  ALT 41  ALKPHOS 122  BILITOT 0.6  PROT 5.3*  ALBUMIN 2.8*   No results for input(s): LIPASE, AMYLASE in the last 168 hours. No results for input(s): AMMONIA in the last 168 hours. Coagulation Profile: Recent Labs  Lab 10/14/20 2000 10/15/20 1228  INR 1.7* 1.2   Cardiac Enzymes: No results for input(s): CKTOTAL, CKMB, CKMBINDEX, TROPONINI in the last 168 hours. BNP (last 3 results) No results for input(s): PROBNP in the last 8760 hours. HbA1C: Recent Labs    10/15/20 0226  HGBA1C 8.7*   CBG: Recent Labs  Lab 10/16/20 0841 10/16/20 1204 10/16/20 1622 10/16/20 2050 10/17/20 0132  GLUCAP 144* 222* 104* 107* 98   Lipid Profile: No results for input(s): CHOL, HDL, LDLCALC, TRIG, CHOLHDL, LDLDIRECT in the last 72 hours. Thyroid Function Tests: No results for input(s): TSH, T4TOTAL, FREET4, T3FREE, THYROIDAB in the last 72 hours. Anemia Panel: No results for input(s):  VITAMINB12, FOLATE, FERRITIN, TIBC, IRON, RETICCTPCT in the last 72 hours. Sepsis Labs: No results for input(s): PROCALCITON, LATICACIDVEN in the last 168 hours.  Recent Results (from the past 240 hour(s))  Respiratory Panel by RT PCR (Flu A&B, Covid) - Nasopharyngeal Swab     Status: None  Collection Time: 10/15/20  3:14 PM   Specimen: Nasopharyngeal Swab; Nasopharyngeal(NP) swabs in vial transport medium  Result Value Ref Range Status   SARS Coronavirus 2 by RT PCR NEGATIVE NEGATIVE Final    Comment: (NOTE) SARS-CoV-2 target nucleic acids are NOT DETECTED.  The SARS-CoV-2 RNA is generally detectable in upper respiratoy specimens during the acute phase of infection. The lowest concentration of SARS-CoV-2 viral copies this assay can detect is 131 copies/mL. A negative result does not preclude SARS-Cov-2 infection and should not be used as the sole basis for treatment or other patient management decisions. A negative result may occur with  improper specimen collection/handling, submission of specimen other than nasopharyngeal swab, presence of viral mutation(s) within the areas targeted by this assay, and inadequate number of viral copies (<131 copies/mL). A negative result must be combined with clinical observations, patient history, and epidemiological information. The expected result is Negative.  Fact Sheet for Patients:  PinkCheek.be  Fact Sheet for Healthcare Providers:  GravelBags.it  This test is no t yet approved or cleared by the [place] FDA and  has been authorized for detection and/or diagnosis of SARS-CoV-2 by FDA under an Emergency Use Authorization (EUA). This EUA will remain  in effect (meaning this test can be used) for the duration of the COVID-19 declaration under Section 564(b)(1) of the Act, 21 U.S.C. section 360bbb-3(b)(1), unless the authorization is terminated or revoked sooner.      Influenza A by PCR NEGATIVE NEGATIVE Final   Influenza B by PCR NEGATIVE NEGATIVE Final    Comment: (NOTE) The Xpert Xpress SARS-CoV-2/FLU/RSV assay is intended as an aid in  the diagnosis of influenza from Nasopharyngeal swab specimens and  should not be used as a sole basis for treatment. Nasal washings and  aspirates are unacceptable for Xpert Xpress SARS-CoV-2/FLU/RSV  testing.  Fact Sheet for Patients: PinkCheek.be  Fact Sheet for Healthcare Providers: GravelBags.it  This test is not yet approved or cleared by the [place] FDA and  has been authorized for detection and/or diagnosis of SARS-CoV-2 by  FDA under an Emergency Use Authorization (EUA). This EUA will remain  in effect (meaning this test can be used) for the duration of the  Covid-19 declaration under Section 564(b)(1) of the Act, 21  U.S.C. section 360bbb-3(b)(1), unless the authorization is  terminated or revoked. Performed at [facility], [place] [address] [place], [place] [address]          Radiology Studies: No results found.      Scheduled Meds: . atorvastatin  80 mg Oral q1800  . Chlorhexidine Gluconate Cloth  6 each Topical Daily  . ezetimibe  10 mg Oral Daily  . gabapentin  300 mg Oral QHS  . insulin aspart  0-9 Units Subcutaneous TID WC  . pantoprazole  40 mg Oral Q0600  . sodium chloride flush  10-40 mL Intracatheter Q12H   Continuous Infusions:   LOS: 2 days    Time spent: 35 minutes.     Elmarie Shiley, MD [facility]   If 7PM-7AM, please contact night-coverage [email or web address]  10/17/2020, 7:43 AM

## 2020-10-17 NOTE — Progress Notes (Signed)
IV team consult note: Spoke with Lurline Hare, stated consult for PIV for am procedure. Patient has a functioning PAC that can be used for am procedure.

## 2020-10-17 NOTE — Transfer of Care (Signed)
Immediate Anesthesia Transfer of Care Note  Patient: Dakota Bennett  Procedure(s) Performed: FLEXIBLE SIGMOIDOSCOPY (N/A ) BIOPSY HEMOSTASIS CLIP PLACEMENT  Patient Location: Endoscopy Unit  Anesthesia Type:MAC  Level of Consciousness: sedated  Airway & Oxygen Therapy: Patient Spontanous Breathing  Post-op Assessment: Report given to RN and Post -op Vital signs reviewed and stable  Post vital signs: Reviewed and stable  Last Vitals:  Vitals Value Taken Time  BP 129/53 10/17/20 0940  Temp    Pulse 63 10/17/20 0940  Resp 13 10/17/20 0940  SpO2 96 % 10/17/20 0940    Last Pain:  Vitals:   10/17/20 0842  TempSrc: Oral  PainSc: 0-No pain         Complications: No complications documented.

## 2020-10-18 DIAGNOSIS — K625 Hemorrhage of anus and rectum: Secondary | ICD-10-CM | POA: Diagnosis not present

## 2020-10-18 DIAGNOSIS — D62 Acute posthemorrhagic anemia: Secondary | ICD-10-CM | POA: Diagnosis not present

## 2020-10-18 DIAGNOSIS — Z85048 Personal history of other malignant neoplasm of rectum, rectosigmoid junction, and anus: Secondary | ICD-10-CM | POA: Diagnosis not present

## 2020-10-18 LAB — GLUCOSE, CAPILLARY
Glucose-Capillary: 105 mg/dL — ABNORMAL HIGH (ref 70–99)
Glucose-Capillary: 130 mg/dL — ABNORMAL HIGH (ref 70–99)

## 2020-10-18 LAB — CBC
HCT: 25.6 % — ABNORMAL LOW (ref 39.0–52.0)
Hemoglobin: 8 g/dL — ABNORMAL LOW (ref 13.0–17.0)
MCH: 25.7 pg — ABNORMAL LOW (ref 26.0–34.0)
MCHC: 31.3 g/dL (ref 30.0–36.0)
MCV: 82.3 fL (ref 80.0–100.0)
Platelets: 91 10*3/uL — ABNORMAL LOW (ref 150–400)
RBC: 3.11 MIL/uL — ABNORMAL LOW (ref 4.22–5.81)
RDW: 15.8 % — ABNORMAL HIGH (ref 11.5–15.5)
WBC: 5.4 10*3/uL (ref 4.0–10.5)
nRBC: 0 % (ref 0.0–0.2)

## 2020-10-18 MED ORDER — FERROUS SULFATE 325 (65 FE) MG PO TABS: 325.0000 mg | ORAL_TABLET | Freq: Every day | ORAL | 3 refills | Status: DC

## 2020-10-18 MED ORDER — POLYETHYLENE GLYCOL 3350 17 G PO PACK
17.0000 g | PACK | Freq: Every day | ORAL | 0 refills | Status: DC
Start: 2020-10-18 — End: 2021-05-08

## 2020-10-18 MED ORDER — POTASSIUM CHLORIDE CRYS ER 20 MEQ PO TBCR: 20.0000 meq | EXTENDED_RELEASE_TABLET | Freq: Every day | ORAL | Status: DC

## 2020-10-18 MED ORDER — FUROSEMIDE 20 MG PO TABS: 20.0000 mg | ORAL_TABLET | ORAL | Status: DC

## 2020-10-18 MED ORDER — FUROSEMIDE 20 MG PO TABS: 40.0000 mg | ORAL_TABLET | ORAL | Status: DC

## 2020-10-18 MED ORDER — FUROSEMIDE 20 MG PO TABS: 20.0000 mg | ORAL_TABLET | Freq: Every day | ORAL | Status: DC

## 2020-10-18 MED ORDER — HEPARIN SOD (PORK) LOCK FLUSH 100 UNIT/ML IV SOLN
500.0000 [IU] | INTRAVENOUS | Status: AC | PRN
  Administered 2020-10-18: 500 [IU]
  Filled 2020-10-18: qty 5

## 2020-10-18 NOTE — Discharge Summary (Signed)
Physician Discharge Summary  Dakota Bennett SFK:[id number] DOB: Jul 08, 1946 DOA: 10/14/2020  PCP: Serita Grammes, MD  Admit date: 10/14/2020 Discharge date: 10/18/2020  Admitted From: Home  Disposition:  Home   Recommendations for Outpatient Follow-up:  1. Follow up with PCP in 1-2 weeks 2. Please obtain BMP/CBC in one week 3. Needs Hb to monitor stability.  4. Please follow up pathology report from rectal mass.  5. Needs to follow up with oncologist for further care of rectal mass.   Home Health: none  Discharge Condition: Stable.  CODE STATUS: Full code Diet recommendation: Heart Healthy   Brief/Interim Summary: 74 year old with past medical history significant for rectal cancer with lung mets, hypertension, hyperlipidemia, diabetes type 2 GERD, hemorrhoids, mitral valve prolapse, CAD with a stent on Plavix, chronic diastolic heart failure, chronic thrombocytopenia who presents to the ED complaining of bright red blood per rectum that started the morning of admission.  Patient noticed large amount of bright red blood with dark blood clot mixed and.  He had 5 episode of rectal bleeding throughout the day which prompted him to the ED.  Reports mild dizziness.  Patient admitted with GI bleed, GI has been consulted.  Overnight he continued to have bloody bowel movement, hemoglobin dropped to 6.  He received 2 units of packed red blood cell. transfer to progressive care unit. He underwent colonoscopy which showed    1-Acute lower GI bleed, rectal bleeding: -Patient hemoglobin decreased to 6, presented with bloody stool.  -Received  2 units of packed red blood cell 11/24. -Continue to hold Plavix -plan for sigmoidoscopy this am.  -hb at 8. No further bleeding. Discharge on oral iron supplement.  -Secondary to rectal ulcer and mass.   2-Metastatic rectal cancer with mets to colon 2018 -Follows in [place]. -CT abdomen and pelvis 08/2020 mild progression of pulmonary mets,  new left mediastinal lymph node enlargement of the left lower lung nodule.  No recurrence of metastatic cancer in abdomen and pelvis Need to follow-up with primary oncologist -Colonoscopy showed non obstructive mass in the rectum, fungating, a single ulcer with prominent vessel was found distal rectum, one hemostatic clip was placed.  Needs to follow result of pathology report and follow up with oncology for further care of rectal mass, likely recurrence.   3-Diabetes type II: Hemoglobin A1c 8.7 Treated  with a sliding scale insulin Resume metformin.   4-Hypertension: Resume home BP medications.   5-Thrombocytopenia: Chronic, remain stable.   6-CAD status post stenting: resume plavix, ok per GI.  HR was in the 80/   7-Chronic diastolic heart failure: resume lasix.      Discharge Diagnoses:  Principal Problem:   Rectal bleeding Active Problems:   History of rectal cancer   Diabetes mellitus type 2, noninsulin dependent (HCC)   Hyperlipidemia LDL goal <70   Acute on chronic blood loss anemia   Acute lower GI bleeding    Discharge Instructions  Discharge Instructions    Diet - low sodium heart healthy   Complete by: As directed    Diet - low sodium heart healthy   Complete by: As directed    Increase activity slowly   Complete by: As directed    Increase activity slowly   Complete by: As directed      Allergies as of 10/18/2020      Reactions   Lisinopril Hives      Adhesive [tape] Rash   Bandaids   Aspirin Other (See Comments)   Burns stomach  Neosporin [neomycin-bacitracin Zn-polymyx] Rash      Medication List    STOP taking these medications   AMBULATORY NON FORMULARY MEDICATION   diphenoxylate-atropine 2.5-0.025 MG tablet Commonly known as: LOMOTIL     TAKE these medications   acetaminophen 500 MG tablet Commonly known as: TYLENOL Take 1,000 mg by mouth every 6 (six) hours as needed for headache (pain).   atorvastatin 80 MG  tablet Commonly known as: LIPITOR Take 1 tablet (80 mg total) by mouth daily at 6 PM.   clopidogrel 75 MG tablet Commonly known as: PLAVIX Take 1 tablet (75 mg total) by mouth daily with breakfast.   ezetimibe 10 MG tablet Commonly known as: ZETIA TAKE 1 TABLET BY MOUTH EVERY DAY   famotidine 20 MG tablet Commonly known as: PEPCID Take 1 tablet (20 mg total) by mouth as needed for heartburn or indigestion.   ferrous sulfate 325 (65 FE) MG tablet Take 1 tablet (325 mg total) by mouth daily.   furosemide 20 MG tablet Commonly known as: LASIX TAKE 1 TAB DAILY. IF NO WEIGHT LOSS OF 3 OR MORE POUNDS AFTER 2 DAYS, INCREASE TO 2 TAB DAILY. What changed: See the new instructions.   gabapentin 300 MG capsule Commonly known as: NEURONTIN Take 300 mg by mouth at bedtime.   hydrALAZINE 25 MG tablet Commonly known as: APRESOLINE TAKE 1 TABLET BY MOUTH THREE TIMES A DAY What changed: when to take this   isosorbide mononitrate 30 MG 24 hr tablet Commonly known as: IMDUR Take 1 tablet (30 mg total) by mouth daily.   Klor-Con M10 10 MEQ tablet Generic drug: potassium chloride TAKE 2 TABLETS (20 MEQ TOTAL) BY MOUTH 3 (THREE) TIMES DAILY. What changed: See the new instructions. Notes to patient: Per Dr. Gennaro Africa take 2 times a day   metFORMIN 500 MG 24 hr tablet Commonly known as: GLUCOPHAGE-XR Take 500 mg by mouth daily with breakfast.   metoprolol succinate 100 MG 24 hr tablet Commonly known as: TOPROL-XL Take 100 mg by mouth daily. Take with or immediately following a meal.   minoxidil 2.5 MG tablet Commonly known as: LONITEN TAKE 2 TABLETS BY MOUTH 2 TIMES DAILY.   nitroGLYCERIN 0.4 MG SL tablet Commonly known as: NITROSTAT Place 1 tablet (0.4 mg total) under the tongue every 5 (five) minutes as needed for chest pain.   pantoprazole 40 MG tablet Commonly known as: PROTONIX Take 40 mg by mouth daily. Notes to patient: ACID REFLUX  HEARTBURN   polyethylene glycol 17 g  packet Commonly known as: MIRALAX / GLYCOLAX Take 17 g by mouth daily. Notes to patient: CONSTIPATION   prochlorperazine 10 MG tablet Commonly known as: COMPAZINE Take 10 mg by mouth 3 (three) times daily as needed for nausea/vomiting.       Follow-up Information    Serita Grammes, MD Follow up in 3 day(s).   Specialty: Family Medicine Why: You need lab work on Monday to check Blood count.  Contact information: [place] [address] [phone]        Richardo Priest, MD .   Specialty: Cardiology Contact information: [address] [place] [address] [phone]              Allergies  Allergen Reactions  . Lisinopril Hives       . Adhesive [Tape] Rash    Bandaids  . Aspirin Other (See Comments)    Burns stomach   . Neosporin [Neomycin-Bacitracin Zn-Polymyx] Rash    Consultations:  GI, [place].    Procedures/Studies: No results found.   Subjective: He denies rectal bleeding.   Discharge Exam: Vitals:   10/18/20 0842 10/18/20 0849  BP: (!) 139/51 (!) 146/49  Pulse: (!) 43 (!) 40  Resp: 14 15  Temp:    SpO2: 100% 91%     General: Pt is alert, awake, not in acute distress Cardiovascular: RRR, S1/S2 +, no rubs, no gallops Respiratory: CTA bilaterally, no wheezing, no rhonchi Abdominal: Soft, NT, ND, bowel sounds + Extremities: no edema, no cyanosis    The results of significant diagnostics from this hospitalization (including imaging, microbiology, ancillary and laboratory) are listed below for reference.     Microbiology: Recent Results (from the past 240 hour(s))  Respiratory Panel by RT PCR (Flu A&B, Covid) - Nasopharyngeal Swab     Status: None   Collection Time: 10/15/20  3:14 PM   Specimen: Nasopharyngeal Swab; Nasopharyngeal(NP) swabs in vial transport medium  Result Value Ref Range Status   SARS Coronavirus 2 by RT PCR NEGATIVE NEGATIVE Final    Comment: (NOTE) SARS-CoV-2 target nucleic acids are NOT  DETECTED.  The SARS-CoV-2 RNA is generally detectable in upper respiratoy specimens during the acute phase of infection. The lowest concentration of SARS-CoV-2 viral copies this assay can detect is 131 copies/mL. A negative result does not preclude SARS-Cov-2 infection and should not be used as the sole basis for treatment or other patient management decisions. A negative result may occur with  improper specimen collection/handling, submission of specimen other than nasopharyngeal swab, presence of viral mutation(s) within the areas targeted by this assay, and inadequate number of viral copies (<131 copies/mL). A negative result must be combined with clinical observations, patient history, and epidemiological information. The expected result is Negative.  Fact Sheet for Patients:  PinkCheek.be  Fact Sheet for Healthcare Providers:  GravelBags.it  This test is no t yet approved or cleared by the [place] FDA and  has been authorized for detection and/or diagnosis of SARS-CoV-2 by FDA under an Emergency Use Authorization (EUA). This EUA will remain  in effect (meaning this test can be used) for the duration of the COVID-19 declaration under Section 564(b)(1) of the Act, 21 U.S.C. section 360bbb-3(b)(1), unless the authorization is terminated or revoked sooner.     Influenza A by PCR NEGATIVE NEGATIVE Final   Influenza B by PCR NEGATIVE NEGATIVE Final    Comment: (NOTE) The Xpert Xpress SARS-CoV-2/FLU/RSV assay is intended as an aid in  the diagnosis of influenza from Nasopharyngeal swab specimens and  should not be used as a sole basis for treatment. Nasal washings and  aspirates are unacceptable for Xpert Xpress SARS-CoV-2/FLU/RSV  testing.  Fact Sheet for Patients: PinkCheek.be  Fact Sheet for Healthcare Providers: GravelBags.it  This test is not yet  approved or cleared by the [place] FDA and  has been authorized for detection and/or diagnosis of SARS-CoV-2 by  FDA under an Emergency Use Authorization (EUA). This EUA will remain  in effect (meaning this test can be used) for the duration of the  Covid-19 declaration under Section 564(b)(1) of the Act, 21  U.S.C. section 360bbb-3(b)(1), unless the authorization is  terminated or revoked. Performed at [facility], [place] [address] [place],  [address]      Labs: BNP (last 3 results) No results for input(s): BNP in the last 8760 hours. Basic Metabolic Panel: Recent Labs  Lab 10/14/20 2000 10/15/20 1228 10/16/20 0315 10/17/20 0706  NA 138 138 137  139  K 3.5 3.7 3.5 3.6  CL 106 104 103 107  CO2 23 23 22 24   GLUCOSE 226* 259* 178* 142*  BUN 9 10 10  6*  CREATININE 0.77 0.83 0.82 0.80  CALCIUM 7.5* 8.2* 8.0* 8.3*  MG  --   --  1.7  --    Liver Function Tests: Recent Labs  Lab 10/14/20 2000  AST 35  ALT 41  ALKPHOS 122  BILITOT 0.6  PROT 5.3*  ALBUMIN 2.8*   No results for input(s): LIPASE, AMYLASE in the last 168 hours. No results for input(s): AMMONIA in the last 168 hours. CBC: Recent Labs  Lab 10/14/20 2000 10/15/20 0226 10/15/20 0554 10/15/20 0554 10/15/20 1228 10/15/20 1228 10/16/20 0315 10/16/20 1225 10/16/20 2217 10/17/20 0706 10/18/20 0255  WBC 7.1   < > 7.1  --  5.2  --  5.7  --   --  5.4 5.4  NEUTROABS 5.2  --   --   --   --   --   --   --   --   --   --   HGB 8.5*   < > 8.5*   < > 8.0*   < > 6.8* 8.5* 8.9* 9.0* 8.0*  HCT 28.9*   < > 27.6*   < > 25.4*   < > 22.4* 27.3* 27.7* 28.5* 25.6*  MCV 82.8   < > 79.1*  --  80.1  --  81.5  --   --  81.4 82.3  PLT 105*   < > 100*  --  91*  --  88*  --   --  98* 91*   < > = values in this interval not displayed.   Cardiac Enzymes: No results for input(s): CKTOTAL, CKMB, CKMBINDEX, TROPONINI in the last 168 hours. BNP: Invalid input(s): POCBNP CBG: Recent Labs  Lab 10/17/20 1117  10/17/20 1730 10/17/20 2143 10/18/20 0448 10/18/20 0806  GLUCAP 115* 161* 106* 105* 130*   D-Dimer No results for input(s): DDIMER in the last 72 hours. Hgb A1c No results for input(s): HGBA1C in the last 72 hours. Lipid Profile No results for input(s): CHOL, HDL, LDLCALC, TRIG, CHOLHDL, LDLDIRECT in the last 72 hours. Thyroid function studies No results for input(s): TSH, T4TOTAL, T3FREE, THYROIDAB in the last 72 hours.  Invalid input(s): FREET3 Anemia work up No results for input(s): VITAMINB12, FOLATE, FERRITIN, TIBC, IRON, RETICCTPCT in the last 72 hours. Urinalysis No results found for: COLORURINE, APPEARANCEUR, [place], [place], [place], [place], [place], [place], PROTEINUR, UROBILINOGEN, NITRITE, LEUKOCYTESUR Sepsis Labs Invalid input(s): PROCALCITONIN,  WBC,  LACTICIDVEN Microbiology Recent Results (from the past 240 hour(s))  Respiratory Panel by RT PCR (Flu A&B, Covid) - Nasopharyngeal Swab     Status: None   Collection Time: 10/15/20  3:14 PM   Specimen: Nasopharyngeal Swab; Nasopharyngeal(NP) swabs in vial transport medium  Result Value Ref Range Status   SARS Coronavirus 2 by RT PCR NEGATIVE NEGATIVE Final    Comment: (NOTE) SARS-CoV-2 target nucleic acids are NOT DETECTED.  The SARS-CoV-2 RNA is generally detectable in upper respiratoy specimens during the acute phase of infection. The lowest concentration of SARS-CoV-2 viral copies this assay can detect is 131 copies/mL. A negative result does not preclude SARS-Cov-2 infection and should not be used as the sole basis for treatment or other patient management decisions. A negative result may occur with  improper specimen collection/handling, submission of specimen other than nasopharyngeal swab, presence of viral mutation(s) within the areas targeted by this  assay, and inadequate number of viral copies (<131 copies/mL). A negative result must be combined with clinical observations, patient history, and  epidemiological information. The expected result is Negative.  Fact Sheet for Patients:  PinkCheek.be  Fact Sheet for Healthcare Providers:  GravelBags.it  This test is no t yet approved or cleared by the [place] FDA and  has been authorized for detection and/or diagnosis of SARS-CoV-2 by FDA under an Emergency Use Authorization (EUA). This EUA will remain  in effect (meaning this test can be used) for the duration of the COVID-19 declaration under Section 564(b)(1) of the Act, 21 U.S.C. section 360bbb-3(b)(1), unless the authorization is terminated or revoked sooner.     Influenza A by PCR NEGATIVE NEGATIVE Final   Influenza B by PCR NEGATIVE NEGATIVE Final    Comment: (NOTE) The Xpert Xpress SARS-CoV-2/FLU/RSV assay is intended as an aid in  the diagnosis of influenza from Nasopharyngeal swab specimens and  should not be used as a sole basis for treatment. Nasal washings and  aspirates are unacceptable for Xpert Xpress SARS-CoV-2/FLU/RSV  testing.  Fact Sheet for Patients: PinkCheek.be  Fact Sheet for Healthcare Providers: GravelBags.it  This test is not yet approved or cleared by the [place] FDA and  has been authorized for detection and/or diagnosis of SARS-CoV-2 by  FDA under an Emergency Use Authorization (EUA). This EUA will remain  in effect (meaning this test can be used) for the duration of the  Covid-19 declaration under Section 564(b)(1) of the Act, 21  U.S.C. section 360bbb-3(b)(1), unless the authorization is  terminated or revoked. Performed at [facility], [place] [address] [place], [place] [address]      Time coordinating discharge: 40 minutes  SIGNED:   Elmarie Shiley, MD  [facility]

## 2020-10-18 NOTE — Progress Notes (Signed)
° °         Daily Rounding Note  10/18/2020, 10:03 AM  LOS: 3 days   SUBJECTIVE:   Chief complaint: Hematochezia, recurrent rectal cancer. Pt on his way home, in hallway  OBJECTIVE:         Vital signs in last 24 hours:    Temp:  [97.9 F (36.6 C)-98.4 F (36.9 C)] 97.9 F (36.6 C) (11/26 0807) Pulse Rate:  [40-73] 40 (11/26 0849) Resp:  [14-17] 15 (11/26 0849) BP: (129-160)/(49-68) 146/49 (11/26 0849) SpO2:  [91 %-100 %] 91 % (11/26 0849) Last BM Date: 10/18/20 Filed Weights   10/14/20 1920 10/17/20 0652  Weight: 81.6 kg 80.8 kg   Spoke w pt briefly at nurses station as he was discharging home.   Did not re-examine pt.   Intake/Output from previous day: 11/25 0701 - 11/26 0700 In: 100 [I.V.:100] Out: 300 [Urine:300]  Intake/Output this shift: No intake/output data recorded.  Lab Results: Recent Labs    10/16/20 0315 10/16/20 1225 10/16/20 2217 10/17/20 0706 10/18/20 0255  WBC 5.7  --   --  5.4 5.4  HGB 6.8*   < > 8.9* 9.0* 8.0*  HCT 22.4*   < > 27.7* 28.5* 25.6*  PLT 88*  --   --  98* 91*   < > = values in this interval not displayed.   BMET Recent Labs    10/15/20 1228 10/16/20 0315 10/17/20 0706  NA 138 137 139  K 3.7 3.5 3.6  CL 104 103 107  CO2 23 22 24   GLUCOSE 259* 178* 142*  BUN 10 10 6*  CREATININE 0.83 0.82 0.80  CALCIUM 8.2* 8.0* 8.3*  PT/INR Recent Labs    10/15/20 1228  LABPROT 14.5  INR 1.2    ASSESMENT:   *  Hematochezia and blood loss anemia in patient with hx metastatic rectal cancer (pulmonary, mediastinal).  Chemotherapy discontinued as of spring 2021.  Now with recurrent rectal tumor. 11/25 Flex sig: Malignant appearing rectal tumor, biopsied.  Nonbleeding internal hemorrhoids.  Solitary distal rectal ulcer with VV, Endo Clip applied. Bleeding either came from the rectal tumor or from the rectal ulcer. S/p PRBC x 2.  Hgb 6.8 >> 8.    *    Thrombocytopenia.  Stable to  slightly improved.  *     Coagulopathy.  INR 1.7 >> 1.2.  *     Chronic Plavix following 02/2019 drug-eluting stent placement and non-STEMI. This is on hold   PLAN   *   Plavix has been resumed.  If recurrent bleeding, consider permanent d/c of this.    *   Reengage oncologist, Dr Hinton Rao in [place] RE treatment options.  *   GI fup prn.  No need for ROV .      Azucena Freed  10/18/2020, 10:03 AM Phone [phone]

## 2020-10-20 ENCOUNTER — Encounter (HOSPITAL_COMMUNITY): Payer: Self-pay | Admitting: Gastroenterology

## 2020-10-22 ENCOUNTER — Other Ambulatory Visit: Payer: Self-pay | Admitting: Hematology and Oncology

## 2020-10-22 DIAGNOSIS — D62 Acute posthemorrhagic anemia: Secondary | ICD-10-CM

## 2020-10-22 DIAGNOSIS — C20 Malignant neoplasm of rectum: Secondary | ICD-10-CM

## 2020-10-22 LAB — SURGICAL PATHOLOGY

## 2020-10-23 ENCOUNTER — Encounter: Payer: Self-pay | Admitting: Oncology

## 2020-10-23 ENCOUNTER — Telehealth: Payer: Self-pay | Admitting: Oncology

## 2020-10-23 ENCOUNTER — Inpatient Hospital Stay: Payer: Medicare Other | Attending: Oncology | Admitting: Oncology

## 2020-10-23 ENCOUNTER — Other Ambulatory Visit: Payer: Self-pay | Admitting: Hematology and Oncology

## 2020-10-23 ENCOUNTER — Inpatient Hospital Stay: Payer: Medicare Other

## 2020-10-23 VITALS — BP 132/61 | HR 74 | Temp 97.9°F | Resp 18 | Ht 70.0 in | Wt 180.9 lb

## 2020-10-23 DIAGNOSIS — I252 Old myocardial infarction: Secondary | ICD-10-CM | POA: Diagnosis not present

## 2020-10-23 DIAGNOSIS — C19 Malignant neoplasm of rectosigmoid junction: Secondary | ICD-10-CM | POA: Diagnosis not present

## 2020-10-23 DIAGNOSIS — D6959 Other secondary thrombocytopenia: Secondary | ICD-10-CM | POA: Insufficient documentation

## 2020-10-23 DIAGNOSIS — D62 Acute posthemorrhagic anemia: Secondary | ICD-10-CM

## 2020-10-23 DIAGNOSIS — R161 Splenomegaly, not elsewhere classified: Secondary | ICD-10-CM | POA: Diagnosis not present

## 2020-10-23 DIAGNOSIS — C7800 Secondary malignant neoplasm of unspecified lung: Secondary | ICD-10-CM

## 2020-10-23 DIAGNOSIS — Z0001 Encounter for general adult medical examination with abnormal findings: Secondary | ICD-10-CM | POA: Diagnosis not present

## 2020-10-23 DIAGNOSIS — C7801 Secondary malignant neoplasm of right lung: Secondary | ICD-10-CM | POA: Diagnosis not present

## 2020-10-23 DIAGNOSIS — Z79899 Other long term (current) drug therapy: Secondary | ICD-10-CM | POA: Diagnosis not present

## 2020-10-23 DIAGNOSIS — Z7984 Long term (current) use of oral hypoglycemic drugs: Secondary | ICD-10-CM | POA: Diagnosis not present

## 2020-10-23 DIAGNOSIS — C20 Malignant neoplasm of rectum: Secondary | ICD-10-CM

## 2020-10-23 DIAGNOSIS — I2510 Atherosclerotic heart disease of native coronary artery without angina pectoris: Secondary | ICD-10-CM

## 2020-10-23 DIAGNOSIS — D649 Anemia, unspecified: Secondary | ICD-10-CM | POA: Diagnosis not present

## 2020-10-23 DIAGNOSIS — C7802 Secondary malignant neoplasm of left lung: Secondary | ICD-10-CM | POA: Insufficient documentation

## 2020-10-23 DIAGNOSIS — G629 Polyneuropathy, unspecified: Secondary | ICD-10-CM | POA: Diagnosis not present

## 2020-10-23 LAB — SAMPLE TO BLOOD BANK

## 2020-10-23 LAB — HEPATIC FUNCTION PANEL
ALT: 35 (ref 10–40)
AST: 40 (ref 14–40)
Alkaline Phosphatase: 135 — AB (ref 25–125)
Bilirubin, Total: 0.6

## 2020-10-23 LAB — CBC AND DIFFERENTIAL
HCT: 29 — AB (ref 41–53)
Hemoglobin: 9.5 — AB (ref 13.5–17.5)
Neutrophils Absolute: 2.75
Platelets: 111 — AB (ref 150–399)
WBC: 4.3

## 2020-10-23 LAB — IRON AND TIBC
Iron: 104 ug/dL (ref 45–182)
Saturation Ratios: 21 % (ref 17.9–39.5)
TIBC: 487 ug/dL — ABNORMAL HIGH (ref 250–450)
UIBC: 383 ug/dL

## 2020-10-23 LAB — CBC: RBC: 3.69 — AB (ref 3.87–5.11)

## 2020-10-23 LAB — BASIC METABOLIC PANEL
BUN: 8 (ref 4–21)
CO2: 23 — AB (ref 13–22)
Chloride: 101 (ref 99–108)
Creatinine: 0.7 (ref 0.6–1.3)
Glucose: 133
Potassium: 3.9 (ref 3.4–5.3)
Sodium: 134 — AB (ref 137–147)

## 2020-10-23 LAB — COMPREHENSIVE METABOLIC PANEL
Albumin: 3.7 (ref 3.5–5.0)
Calcium: 8.5 — AB (ref 8.7–10.7)

## 2020-10-23 LAB — FERRITIN: Ferritin: 13 ng/mL — ABNORMAL LOW (ref 24–336)

## 2020-10-23 NOTE — Telephone Encounter (Signed)
No 12/1 LOS entered 

## 2020-10-23 NOTE — Progress Notes (Signed)
[name]  [address] [place],  [place]  [address] [phone]  Clinic Day:  10/23/2020  Referring physician: Serita Grammes, MD   This document serves as a record of services personally performed by Dakota Poisson, MD. It was created on their behalf by Curry,Dakota Bennett. The creation of this record is based on the Bennett's personal observations and the provider's statements to them.   CHIEF COMPLAINT:  CC: Stage IIIA rectosigmoid adenocarcinoma  Current Treatment:  Surveillance   HISTORY OF PRESENT ILLNESS:  Dakota Bennett is a 74 y.o. 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 This was found because of a positive Cologuard test, but he did have some rectal bleeding for several months.  Colonoscopy revealed an ulcerated mass at the proximal rectum at 9 cm and extending from the proximal rectum to the rectosigmoid junction.  An endoscopic ultrasound was done and confirmed a nonobstructing medium-sized mass of the proximal rectum involving 2/3 of the circumference and measuring 3 cm long, occurring 10 cm from the anal verge.  He also had 2 rounded suspicious perirectal lymph nodes measuring 5 and 6 mm.  CT scans of the abdomen and pelvis revealed several rounded nodules at the lung bases, which were tiny and largely unchanged from prior scans, so felt to likely be benign. CEA was elevated at 28.3.  He received neoadjuvant chemoradiation with 5 fluorouracil infusion for the 1st and 5th week of radiation and completed treatment the end of June.  He tolerated this fairly well, but had severe dysuria, as well as urinary hesitancy and soft stools.  We scheduled him for CT chest, abdomen and pelvis and referred him to Dr. Amalia Bennett for consideration of surgery.  However, his CT chest revealed several pulmonary nodules, which had enlarged.  There was generalized wall  thickening in the rectum, which could be due to the radiotherapy.  He underwent PET scan, which revealed increased uptake in the pulmonary nodules felt to be consistent with metastatic disease.  There was no evidence of persistence of disease in the rectum or other areas of metastasis.  His surgical plans were canceled.  The CEA was normal at that time. We obtained KRAS testing on his tumor, which unfortunately did show a mutation.  MMR testing of the tumor was normal.  We recommended palliative chemotherapy with FOLFOX/bevacizumab.  The patient had persistent thrombocytopenia, so we reduced his chemotherapy doses by a total of 25%.  Repeat imaging after 6 cycles of FOLFOX/bevacizumab was overall stable, there was some increase in the larger pulmonary nodules, however, they appeared necrotic indicating a response to therapy.   After 12 cycles of FOLFOX/bevacizumab restaging scans were obtained that showed the dominant left lower lobe lesion had substantially decreased in size and no new or progressive pulmonary nodule or masses were seen.  Oxaliplatin was discontinued due to neuropathy in January 2019.  Repeat CT imaging after 20 cycles revealed stable to decreased pulmonary nodules with no other evidence of metastasis.    He was continued on palliative chemotherapy with occasional changes in dosing and schedule.  He has had several episodes of rectal bleeding.  Dakota Bennett performed sigmoidoscopy in March 2020 which did not reveal any evidence of disease in the rectum.  He had worsening neuropathy of the feet, so was started on gabapentin 300 mg at bedtime and the dose was increased but later stopped.  While  having a 39th cycle of infusional 5-fluorouracil, he contacted [place] to report episodes of chest pain since starting his treatment with radiation down the arms and an episode of emesis.  He was referred to the emergency department and found to have had a non STEM in March 2020I.  His 5 FU chemotherapy pump was  discontinued.  He was transferred to [facility] for further evaluation and treatment, and had 3 stents placed in early April. Per Dakota Bennett telehealth note, the patient had residual LAD disease and recommended he be treated medically unless he has refractory angina, as he would require atherectomy of a proximal LAD lesion.  Dakota Bennett recommended discontinuing the gabapentin.  He placed the patient back on magnesium oxide 400 mg daily.  He was also started on clopidogrel 75 mg daily, as well as isosorbide mononitrate 30 mg once a day, amlodipine 5 mg once a day, atorvastatin 80 mg once a day and aspirin 81 mg daily.  Chemotherapy was held due to his recent non STEMI and resumed in May 2020.  Bevacizumab was discontinued due to recurrent rectal bleeding in June 2020 and the dose of chemotherapy was reduced by 10% due to worsening thrombocytopenia.  CT imaging in July 2020 revealed a mild interval increase in size of the extensive bilateral pulmonary nodules, as well as increased wall thickening of the rectum.  He also was found to have increasing splenomegaly.  His hemoglobin had also dropped to 7.8, and his platelet count had dropped to 80,000.  We therefore stopped his treatment due to progressive disease, as well as worsening anemia and thrombocytopenia.  He had evidence of recurrent iron deficiency, so received IV iron in July with improvement in his anemia.  Since July of 2020 he has had slow steady progression of his lung metastases but has remained asymptomatic.  His CEA has steadily increased as well.  CT chest, abdomen and pelvis from October 2021 revealed mild progression of the diffuse bilateral pulmonary metastases and a new 11 mm left mediastinal lymph node.  Index nodule in the medial left lower lobe currently measures 3.4 x 2.7 cm, compared to 2.8 x 2.2 cm previously.  Otherwise, the exam was stable.  CEA from October was 457.0.   INTERVAL HISTORY:  Dakota Bennett presented to [facility] on  November 22nd due to 5 episodes of bright red blood per rectum with dark clotting that day and was admitted.  His hemoglobin dropped down to 6 during his stay and he received 2 units of PRBCs on November 24th.  He was advised to hold Plavix.  He underwent a flexible sigmoidoscopy on November 25th revealing a single small ulcer with a prominent visble vessel in the distal rectum. For hemostasis, one hemostatic clip was successfully placed. There was no bleeding at the end of the procedure.  A fungating and ulcerated non-obstructing mass was found in the rectum. The mass was traversable, circumferential and measured five cm in length. There was mild contact oozing present. This was biopsied and surgical pathology was consistent with adenocarcinoma.  The sigmoid colon mucosa proximal to the mass was otherwise normal appearing. No blood noted in the lumen proximal to the mass. Non-bleeding small internal hemorrhoids were found during retroflexion. He is now here for follow up, and states that he has not had recurrent bleeding.  He denies rectal pain, but does report rectal pressure.  When he does have pain of the rectum he rates this as a 6/10.  He started oral  iron supplement daily on November 26th.  He has resumed Plavix as advised.  His hemoglobin today is 9.5, his platelet count is 111,000, and his white count is normal.  Chemistries are unremarkable.  His  appetite is good, and he has lost 7 pounds since his last visit.  He denies fever, chills or other signs of infection.  He denies nausea, vomiting, bowel issues, or abdominal pain.  He denies sore throat, cough, dyspnea, or chest pain.    REVIEW OF SYSTEMS:  Review of Systems  Constitutional: Negative.   HENT:  Negative.   Eyes: Negative.   Respiratory: Negative.   Cardiovascular: Negative.   Gastrointestinal: Positive for rectal pain (intermittent).  Endocrine: Negative.   Genitourinary: Negative.    Musculoskeletal: Negative.   Skin: Negative.    Neurological: Negative.   Hematological: Negative.   Psychiatric/Behavioral: Negative.   All other systems reviewed and are negative.    VITALS:  Blood pressure 132/61, pulse 74, temperature 97.9 F (36.6 C), temperature source Oral, resp. rate 18, height _0  (1.778 m), weight 180 lb 14.4 oz (82.1 kg), SpO2 96 %.  Wt Readings from Last 3 Encounters:  10/23/20 180 lb 14.4 oz (82.1 kg)  10/17/20 178 lb 2.1 oz (80.8 kg)  05/15/20 189 lb 12.8 oz (86.1 kg)    Body mass index is 25.96 kg/m.  Performance status (ECOG): 1 - Symptomatic but completely ambulatory  PHYSICAL EXAM:  Physical Exam Constitutional:      General: He is not in acute distress.    Appearance: Normal appearance. He is normal weight.  HENT:     Head: Normocephalic and atraumatic.  Eyes:     General: No scleral icterus.    Extraocular Movements: Extraocular movements intact.     Conjunctiva/sclera: Conjunctivae normal.     Pupils: Pupils are equal, round, and reactive to light.  Cardiovascular:     Rate and Rhythm: Normal rate and regular rhythm.     Pulses: Normal pulses.     Heart sounds: Normal heart sounds. No murmur heard.  No friction rub. No gallop.   Pulmonary:     Effort: Pulmonary effort is normal. No respiratory distress.     Breath sounds: Normal breath sounds.  Abdominal:     General: Bowel sounds are normal. There is no distension.     Palpations: Abdomen is soft. There is no mass.     Tenderness: There is no abdominal tenderness.  Musculoskeletal:        General: Normal range of motion.     Cervical back: Normal range of motion and neck supple.     Right lower leg: No edema.     Left lower leg: No edema.  Lymphadenopathy:     Cervical: No cervical adenopathy.  Skin:    General: Skin is warm and dry.  Neurological:     General: No focal deficit present.     Mental Status: He is alert and oriented to person, place, and time. Mental status is at baseline.  Psychiatric:        Mood  and Affect: Mood normal.        Behavior: Behavior normal.        Thought Content: Thought content normal.        Judgment: Judgment normal.     LABS:   CBC Latest Ref Rng & Units 10/23/2020 10/18/2020 10/17/2020  WBC - 4.3 5.4 5.4  Hemoglobin 13.5 - 17.5 9.5(A) 8.0(L) 9.0(L)  Hematocrit 41 - 53 29(A)  25.6(L) 28.5(L)  Platelets 150 - 399 111(A) 91(L) 98(L)   CMP Latest Ref Rng & Units 10/23/2020 10/17/2020 10/16/2020  Glucose 70 - 99 mg/dL - 142(H) 178(H)  BUN 4 - 21 8 6(L) 10  Creatinine 0.6 - 1.3 0.7 0.80 0.82  Sodium 137 - 147 134(A) 139 137  Potassium 3.4 - 5.3 3.9 3.6 3.5  Chloride 99 - 108 101 107 103  CO2 13 - 22 23(A) 24 22  Calcium 8.7 - 10.7 8.5(A) 8.3(L) 8.0(L)  Total Protein 6.5 - 8.1 g/dL - - -  Total Bilirubin 0.3 - 1.2 mg/dL - - -  Alkaline Phos 25 - 125 135(A) - -  AST 14 - 40 40 - -  ALT 10 - 40 35 - -     No results found for: CEA1 / No results found for: CEA1   STUDIES:   He underwent a flexible sigmoidoscopy on 10/17/2020 showing: Findings: 1.  A single small ulcer with a prominent visble vessel was found in the distal rectum. For hemostasis, one hemostatic clip was successfully placed. There was no bleeding at the end of the procedure. 2.  A fungating and ulcerated non-obstructing mass was found in the rectum. The mass was circumferential.  The mass measured five cm in length. There was mild contact oozing was present. This was biopsied with a cold forceps for histology. Estimated blood loss was minimal. The mass was traversable. The sigmoid colon mucosa proximal to the mass was otherwise normal appearing. No blood noted in the lumen proximal to the mass. 3.  Non-bleeding internal hemorrhoids were found during retroflexion. The hemorrhoids were small and Grade II (internal hemorrhoids that prolapse but reduce spontaneously).  Pathology: Rectum, mass, biopsy:  -  Adenocarcinoma  Allergies:  Allergies  Allergen Reactions  . Lisinopril Hives        . Adhesive [Tape] Rash    Bandaids  . Aspirin Other (See Comments)    Burns stomach   . Neosporin [Neomycin-Bacitracin Zn-Polymyx] Rash    Current Medications: Current Outpatient Medications  Medication Sig Dispense Refill  . atorvastatin (LIPITOR) 80 MG tablet Take 1 tablet (80 mg total) by mouth daily at 6 PM. 30 tablet 0  . clopidogrel (PLAVIX) 75 MG tablet Take 1 tablet (75 mg total) by mouth daily with breakfast. 30 tablet 0  . diphenoxylate-atropine (LOMOTIL) 2.5-0.025 MG tablet Take by mouth 4 (four) times daily as needed for diarrhea or loose stools.    . ezetimibe (ZETIA) 10 MG tablet TAKE 1 TABLET BY MOUTH EVERY DAY 90 tablet 3  . famotidine (PEPCID) 20 MG tablet Take 1 tablet (20 mg total) by mouth as needed for heartburn or indigestion. 30 tablet 0  . ferrous sulfate 325 (65 FE) MG tablet Take 1 tablet (325 mg total) by mouth daily. 30 tablet 3  . gabapentin (NEURONTIN) 300 MG capsule Take 300 mg by mouth at bedtime.    . hydrALAZINE (APRESOLINE) 25 MG tablet TAKE 1 TABLET BY MOUTH THREE TIMES A DAY (Patient taking differently: Take 25 mg by mouth daily. ) 270 tablet 1  . isosorbide mononitrate (IMDUR) 30 MG 24 hr tablet Take 1 tablet (30 mg total) by mouth daily. 30 tablet 0  . KLOR-CON M10 10 MEQ tablet TAKE 2 TABLETS (20 MEQ TOTAL) BY MOUTH 3 (THREE) TIMES DAILY. (Patient taking differently: Take 20 mEq by mouth 3 (three) times daily. ) 180 tablet 3  . metFORMIN (GLUCOPHAGE-XR) 500 MG 24 hr tablet Take 500 mg by mouth  in the morning and at bedtime.     . metoprolol succinate (TOPROL-XL) 100 MG 24 hr tablet Take 100 mg by mouth daily. Take with or immediately following a meal.    . minoxidil (LONITEN) 2.5 MG tablet TAKE 2 TABLETS BY MOUTH 2 TIMES DAILY. (Patient taking differently: Take 5 mg by mouth 2 (two) times daily. ) 360 tablet 1  . pantoprazole (PROTONIX) 40 MG tablet Take 40 mg by mouth daily.    . polyethylene glycol (MIRALAX / GLYCOLAX) 17 g packet Take 17 g by mouth  daily. 14 each 0  . prochlorperazine (COMPAZINE) 10 MG tablet Take 10 mg by mouth 3 (three) times daily as needed for nausea/vomiting.    . nitroGLYCERIN (NITROSTAT) 0.4 MG SL tablet Place 1 tablet (0.4 mg total) under the tongue every 5 (five) minutes as needed for chest pain. (Patient not taking: Reported on 10/14/2020) 25 tablet 3   No current facility-administered medications for this visit.     ASSESSMENT & PLAN:   Assessment:   1. Metastatic colorectal cancer to the lung.  He had mild progression of disease on palliative infusional 5 fluorouracil/leucovorin/bevacizumab every 3 weeks, so was switched back to infusional 5 fluorouracil/leucovorin/bevacizumab every 2 weeks.  He had minimal progression of disease from February to July 2020, so we continued the same regimen.  Bevacizumab was discontinued in May 2020 due to rectal bleeding.  CT imaging in July 2020 revealed progressive disease both in the lungs and thickening of the rectum, so chemotherapy was discontinued and the patient was placed on observation.  He has had continued slow progression of his disease on imaging, as well as an increasing CEA.  CT imaging in October revealed progressive pulmonary metastasis and a new 11 mm left mediastinal lymph node.  The patient at that time desired to continue observation, but is willing to get back on treatment if his disease significantly worsens.  As he now has a confirmed malignant rectal mass we discussed treatment options today.    2.  Rectal cancer which has not been resected due to the metastatic disease.  He has had intermittent bleeding but CT scan just shows thickening in that area.  I discussed this with his surgeon Dr. Kendell Bane, and he feels that this would be a major surgical undertaking with high risk and small benefit since he still has progressive metastatic disease elsewhere.  However, we could consider some additional palliative radiation.  3. Persistent neuropathy.  4.  Anemia, which is stable to mildly worse.    5. Thrombocytopenia, secondary to chemotherapy and splenomegaly, which has improved.  6. Splenomegaly on CT imaging, which is likely contributing to his thrombocytopenia.  I cannot palpate the spleen on physical exam.    7. History of non-ST elevation myocardial infarction secondary to coronary disease.  He had 3 coronary artery stents placed in April 2020.   8.  Recent hospitalization due to significant GI bleed.  His hemoglobin dropped down to 6 at that time, and he received 2 units of PRBCs.  Sigmoidoscopy confirmed a 5 cm fungating and ulcerated non-obstructing mass in the rectum and biopsy was consistent with adenocarcinoma.  We therefore discussed treatment options today.  Plan: Due to his recent hospitalization and episodes of GI bleeding, we need to discuss treatment at this time.  We discussed treatment options including chemotherapy or palliative radiation boost to control the bleeding, as he has received 4500 cGy with a rectal boost of 540 previously.  Surgical resection is  likely not considered, and I discussed this with Dr. Amalia Bennett who states that this would be a very invasive procedure.  I would recommend chemotherapy to control his disease if he wishes to be aggressive.  However, I am concerned that his rectal bleeding could reoccur while on chemotherapy as he develops cytopenias.  We therefore might want to consider palliative radiation first to avoid this.  Another option would be supportive and comfort care.  Dakota Bennett is willing to go ahead and pursue radiation, so we will schedule him with Dr. Orlene Erm on Friday.  Once he speaks with him we can finalize a treatment plan.  I would still recommend chemotherapy after he recovers from the radiation and we will plan to meet with him in a month to discuss this.  The patient understands the plans discussed today and is in agreement with them.  He knows to contact our office if he develops concerns prior to  his next appointment.   I provided 25 minutes of face-to-face time during this this encounter and > 50% was spent counseling as documented under my assessment and plan.    Derwood Kaplan, MD [facility] [address] [place] [address] Dept: [phone] Dept Fax: [phone]   I, Rita Ohara, am acting as Bennett for Derwood Kaplan, MD  I have reviewed this report as typed by the medical Bennett, and it is complete and accurate.

## 2020-10-24 ENCOUNTER — Telehealth: Payer: Self-pay

## 2020-10-24 LAB — CEA: CEA: 320 ng/mL — ABNORMAL HIGH (ref 0.0–4.7)

## 2020-10-24 NOTE — Anesthesia Postprocedure Evaluation (Signed)
Anesthesia Post Note  Patient: Dakota Bennett  Procedure(s) Performed: FLEXIBLE SIGMOIDOSCOPY (N/A ) BIOPSY HEMOSTASIS CLIP PLACEMENT     Patient location during evaluation: Endoscopy Anesthesia Type: MAC Level of consciousness: awake and alert Pain management: pain level controlled Vital Signs Assessment: post-procedure vital signs reviewed and stable Respiratory status: spontaneous breathing, nonlabored ventilation, respiratory function stable and patient connected to nasal cannula oxygen Cardiovascular status: stable and blood pressure returned to baseline Postop Assessment: no apparent nausea or vomiting Anesthetic complications: no   No complications documented.  Last Vitals:  Vitals:   10/18/20 0842 10/18/20 0849  BP: (!) 139/51 (!) 146/49  Pulse: (!) 43 (!) 40  Resp: 14 15  Temp:    SpO2: 100% 91%    Last Pain:  Vitals:   10/18/20 0840  TempSrc:   PainSc: 0-No pain                 Haston Casebolt

## 2020-10-24 NOTE — Telephone Encounter (Signed)
Pt called to notify us that he took a in home covid test last night and it was positive.  Dr. Hinton Rao and Dr. Orlene Erm aware.

## 2020-10-29 ENCOUNTER — Other Ambulatory Visit: Payer: Self-pay | Admitting: Cardiology

## 2020-10-29 NOTE — Telephone Encounter (Signed)
Refill sent to pharmacy.   

## 2020-10-30 ENCOUNTER — Other Ambulatory Visit: Payer: Self-pay | Admitting: Hematology and Oncology

## 2020-10-30 DIAGNOSIS — K219 Gastro-esophageal reflux disease without esophagitis: Secondary | ICD-10-CM

## 2020-11-01 ENCOUNTER — Inpatient Hospital Stay: Payer: Medicare Other

## 2020-11-01 ENCOUNTER — Inpatient Hospital Stay: Payer: Medicare Other | Admitting: Hematology and Oncology

## 2020-11-05 ENCOUNTER — Other Ambulatory Visit: Payer: Self-pay | Admitting: Cardiology

## 2020-11-05 NOTE — Telephone Encounter (Signed)
Lasix was d/c back on 09/2019 per Dr. Bettina Gavia, Pharmacy updated with this information. Rx denied.

## 2020-11-06 ENCOUNTER — Other Ambulatory Visit: Payer: Self-pay | Admitting: Hematology and Oncology

## 2020-11-06 DIAGNOSIS — C20 Malignant neoplasm of rectum: Secondary | ICD-10-CM

## 2020-11-07 DIAGNOSIS — C218 Malignant neoplasm of overlapping sites of rectum, anus and anal canal: Secondary | ICD-10-CM | POA: Diagnosis not present

## 2020-11-07 DIAGNOSIS — Z923 Personal history of irradiation: Secondary | ICD-10-CM | POA: Diagnosis not present

## 2020-11-08 ENCOUNTER — Other Ambulatory Visit: Payer: Self-pay

## 2020-11-08 DIAGNOSIS — R519 Headache, unspecified: Secondary | ICD-10-CM | POA: Insufficient documentation

## 2020-11-08 DIAGNOSIS — K649 Unspecified hemorrhoids: Secondary | ICD-10-CM | POA: Insufficient documentation

## 2020-11-08 DIAGNOSIS — E785 Hyperlipidemia, unspecified: Secondary | ICD-10-CM | POA: Insufficient documentation

## 2020-11-08 DIAGNOSIS — E119 Type 2 diabetes mellitus without complications: Secondary | ICD-10-CM | POA: Insufficient documentation

## 2020-11-08 DIAGNOSIS — I341 Nonrheumatic mitral (valve) prolapse: Secondary | ICD-10-CM | POA: Insufficient documentation

## 2020-11-08 DIAGNOSIS — J309 Allergic rhinitis, unspecified: Secondary | ICD-10-CM | POA: Insufficient documentation

## 2020-11-08 DIAGNOSIS — K219 Gastro-esophageal reflux disease without esophagitis: Secondary | ICD-10-CM | POA: Insufficient documentation

## 2020-11-13 DIAGNOSIS — I10 Essential (primary) hypertension: Secondary | ICD-10-CM | POA: Insufficient documentation

## 2020-11-13 NOTE — Progress Notes (Signed)
Cardiology Office Note:    Date:  11/14/2020   ID:  Sylvin Richcreek, DOB [birth year], MRN [id number]  PCP:  Serita Grammes, MD  Cardiologist:  Shirlee More, MD    Referring MD: Serita Grammes, MD    ASSESSMENT:    1. CAD in native artery   2. Hypertensive heart disease, unspecified whether heart failure present   3. Hyperlipidemia LDL goal <70    PLAN:    In order of problems listed above:  1. Stable CAD New York Heart Association class II continue current treatment including antiplatelet clopidogrel beta-blocker and lipid-lowering high intensity statin.  Along with Zetia.  He will start to use nitroglycerin prior to activities that provoke chest pain 2. Previously difficult to control BP is at target continue current treatment including minoxidil and no longer is on a loop diuretic 3. Ideal lipids continue his current combined high intensity statin and Zetia   Next appointment: 6 months   Medication Adjustments/Labs and Tests Ordered: Current medicines are reviewed at length with the patient today.  Concerns regarding medicines are outlined above.  No orders of the defined types were placed in this encounter.  No orders of the defined types were placed in this encounter.   Chief complaint: Follow-up for CAD  History of Present Illness:    Makya Lingren is a 74 y.o. 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  Has a history of CAD with NSTEMI and PCI 02/27/2019 s/p stent to the RCA complicated with acute dissection requiring 3 drug-eluting stents for control. Residual proximal LAD disease 70% first marginal 50% with a  recommendation to treat medically unless he has refractory angina as he would require atherectomy. Ventricular function was normal ejection fraction 55 to 65%.     Compliance with diet, lifestyle and medications: Yes  Despite his  medical problems.  He has a good quality of life and is pleased.  His problem is taking the trash out in the evening uphill and he gets typical angina relieved with nitroglycerin.  Happens once a week.  Order to start taking a prophylactic tablet before these activities.  Otherwise no angina dyspnea palpitation or syncope.  He has had no recurrent rectal bleeding he is intolerant of aspirin and continues clopidogrel recent labs 09/26/2020 showed lipids at target cholesterol 93 LDL 38 triglycerides 92 HDL 37 A1c is elevated 8.7%.  He is followed by oncology most recent visit 10/23/2020 with mild progression of disease on palliative treatment fluorouracil leucovorin and bevacizumab.  He had a recent hospitalization due to GI bleed hemoglobin of 6 and required transfusion.  His Plavix was briefly interrupted and resumed at discharge.  Sigmoidoscopy confirmed a 5 cm fungating and ulcerated nonobstructing mass in the rectum and biopsy showed adenocarcinoma.  Surgical resection was not felt to be an option continued on chemotherapy and referred to radiation therapy for palliative treatment. Past Medical History:  Diagnosis Date  . Acute lower GI bleeding 10/15/2020  . Allergic rhinitis   . CAD in native artery 03/02/2019  . Cancer ([place])    rectal cancer and found spots on his lungs also and is on chemo currently   . Diabetes mellitus type 2, noninsulin dependent ([place]) 02/24/2019  . Diabetes mellitus without complication ([place])    type 2  . Edema 04/20/2019   He is on a CCB and gabapentin normal pro BNP  . Essential hypertension 09/29/2017  .  GERD (gastroesophageal reflux disease)   . Headache    hx of migraines yrs ago  . Hemorrhoids   . History of rectal cancer   . Hyperlipidemia   . Hyperlipidemia LDL goal <70   . Hypertension   . Hypertensive heart disease 05/24/2019  . Hyponatremia   . Iron deficiency anemia secondary to blood loss (chronic) 10/15/2020  . Localized edema 05/25/2019  . Malignant  neoplasm of rectum ([place]) 03/29/2017  . MVP (mitral valve prolapse)    dx 42 yrs no cardiologist  . NSTEMI (non-ST elevated myocardial infarction) ([place]) 02/24/2019   02/27/2019 troponin peaked 0.25 he had PCI and stent to the right coronary artery complicated with an acute dissection requiring 3 drug-eluting stents for control he had residual proximal LAD disease 70% first marginal 50% of recommendation to treat medically unless he has refractory angina as he would require atherectomy for the proximal LAD lesion.  Also of note is that his ventricular function   . Rectal bleeding   . Status post coronary artery stent placement   . Thrombocytopenia ([place])     Past Surgical History:  Procedure Laterality Date  . BIOPSY  10/17/2020   Procedure: BIOPSY;  Surgeon: Lavena Bullion, DO;  Location: [facility];  Service: Gastroenterology;;  . COLONOSCOPY  03/03/2017   Proximal rectal mass (biopsied). Transverse colonic polyps status post polypectomy. Mild sigmoid diverticulosis  . CORONARY STENT INTERVENTION N/A 02/27/2019   Procedure: CORONARY STENT INTERVENTION;  Surgeon: Martinique, Peter M, MD;  Location: [facility];  Service: Cardiovascular;  Laterality: N/A;  . EUS N/A 03/18/2017   Procedure: LOWER ENDOSCOPIC ULTRASOUND (EUS);  Surgeon: Milus Banister, MD;  Location: [facility];  Service: Endoscopy;  Laterality: N/A;  . extensive dental work    . FLEXIBLE SIGMOIDOSCOPY N/A 02/21/2019   Procedure: FLEXIBLE SIGMOIDOSCOPY;  Surgeon: Jackquline Denmark, MD;  Location: [facility];  Service: Endoscopy;  Laterality: N/A;  . FLEXIBLE SIGMOIDOSCOPY N/A 10/17/2020   Procedure: FLEXIBLE SIGMOIDOSCOPY;  Surgeon: Lavena Bullion, DO;  Location: [facility];  Service: Gastroenterology;  Laterality: N/A;  . HEMOSTASIS CLIP PLACEMENT  10/17/2020   Procedure: HEMOSTASIS CLIP PLACEMENT;  Surgeon: Lavena Bullion, DO;  Location: [facility];  Service: Gastroenterology;;  . ingrown toenail removed  age 28  or 65  . LEFT HEART CATH AND CORONARY ANGIOGRAPHY N/A 02/27/2019   Procedure: LEFT HEART CATH AND CORONARY ANGIOGRAPHY;  Surgeon: Martinique, Peter M, MD;  Location: [facility];  Service: Cardiovascular;  Laterality: N/A;    Current Medications: Current Meds  Medication Sig  . atorvastatin (LIPITOR) 80 MG tablet Take 1 tablet (80 mg total) by mouth daily at 6 PM.  . clopidogrel (PLAVIX) 75 MG tablet Take 1 tablet (75 mg total) by mouth daily with breakfast.  . ezetimibe (ZETIA) 10 MG tablet TAKE 1 TABLET BY MOUTH EVERY DAY  . famotidine (PEPCID) 20 MG tablet Take 1 tablet (20 mg total) by mouth as needed for heartburn or indigestion.  . ferrous sulfate 325 (65 FE) MG tablet Take 1 tablet (325 mg total) by mouth daily.  Marland Kitchen gabapentin (NEURONTIN) 300 MG capsule Take 300 mg by mouth at bedtime.  . hydrALAZINE (APRESOLINE) 25 MG tablet TAKE 1 TABLET BY MOUTH THREE TIMES A DAY  . isosorbide mononitrate (IMDUR) 30 MG 24 hr tablet Take 1 tablet (30 mg total) by mouth daily.  . metFORMIN (GLUCOPHAGE-XR) 500 MG 24 hr tablet Take 500 mg by mouth in the morning and at bedtime.   Marland Kitchen  metoprolol succinate (TOPROL-XL) 100 MG 24 hr tablet Take 100 mg by mouth daily. Take with or immediately following a meal.  . nitroGLYCERIN (NITROSTAT) 0.4 MG SL tablet Place 1 tablet (0.4 mg total) under the tongue every 5 (five) minutes as needed for chest pain.  . pantoprazole (PROTONIX) 40 MG tablet TAKE 1 TABLET BY MOUTH EVERY DAY (Patient taking differently: as needed.)  . polyethylene glycol (MIRALAX / GLYCOLAX) 17 g packet Take 17 g by mouth daily.  . Potassium Chloride ER 20 MEQ TBCR Take 20 mEq by mouth daily.  . prochlorperazine (COMPAZINE) 10 MG tablet Take 10 mg by mouth 3 (three) times daily as needed for nausea/vomiting.     Allergies:   Lisinopril, Adhesive [tape], Aspirin, and Neosporin [neomycin-bacitracin zn-polymyx]   Social History   Socioeconomic History  . Marital status: Married    Spouse name:  Not on file  . Number of children: 3  . Years of education: Not on file  . Highest education level: Not on file  Occupational History  . Not on file  Tobacco Use  . Smoking status: Former Smoker    Packs/day: 2.00    Years: 20.00    Pack years: 40.00    Types: Cigarettes  . Smokeless tobacco: Former Systems developer    Quit date: 11/23/1998  . Tobacco comment: quit 2000  Vaping Use  . Vaping Use: Never used  Substance and Sexual Activity  . Alcohol use: No  . Drug use: No  . Sexual activity: Not on file  Other Topics Concern  . Not on file  Social History Narrative  . Not on file   Social Determinants of Health   Financial Resource Strain: Not on file  Food Insecurity: Not on file  Transportation Needs: Not on file  Physical Activity: Not on file  Stress: Not on file  Social Connections: Not on file     Family History: The patient's family history includes Heart attack in his father; Prostate cancer in his father. There is no history of Colon cancer or Esophageal cancer. ROS:   Please see the history of present illness.    All other systems reviewed and are negative.  EKGs/Labs/Other Studies Reviewed:    The following studies were reviewed today:  EKG:  EKG ordered today and personally reviewed.  The ekg ordered today demonstrates sinus rhythm left bundle branch block  Recent Labs: 10/16/2020: Magnesium 1.7 10/23/2020: ALT 35; BUN 8; Creatinine 0.7; Hemoglobin 9.5; Platelets 111; Potassium 3.9; Sodium 134  Recent Lipid Panel    Component Value Date/Time   CHOL 150 02/26/2019 0348   TRIG 88 02/26/2019 0348   HDL 35 (L) 02/26/2019 0348   CHOLHDL 4.3 02/26/2019 0348   VLDL 18 02/26/2019 0348   LDLCALC 97 02/26/2019 0348    Physical Exam:    VS:  BP (!) 126/48 (BP Location: Right Arm, Patient Position: Sitting)   Pulse (!) 38   Ht 5\' 10"  (1.778 m)   Wt 176 lb (79.8 kg)   SpO2 96%   BMI 25.25 kg/m     Wt Readings from Last 3 Encounters:  11/14/20 176 lb (79.8 kg)   10/23/20 180 lb 14.4 oz (82.1 kg)  10/17/20 178 lb 2.1 oz (80.8 kg)     GEN:  Well nourished, well developed in no acute distress HEENT: Normal NECK: No JVD; No carotid bruits LYMPHATICS: No lymphadenopathy CARDIAC: S2 is paradoxical RRR, no murmurs, rubs, gallops RESPIRATORY:  Clear to auscultation without rales, wheezing or  rhonchi  ABDOMEN: Soft, non-tender, non-distended MUSCULOSKELETAL:  No edema; No deformity  SKIN: Warm and dry NEUROLOGIC:  Alert and oriented x 3 PSYCHIATRIC:  Normal affect    Signed, Shirlee More, MD  11/14/2020 1:18 PM    [facility]

## 2020-11-14 ENCOUNTER — Ambulatory Visit (INDEPENDENT_AMBULATORY_CARE_PROVIDER_SITE_OTHER): Payer: Medicare Other | Admitting: Cardiology

## 2020-11-14 ENCOUNTER — Encounter: Payer: Self-pay | Admitting: Cardiology

## 2020-11-14 ENCOUNTER — Other Ambulatory Visit: Payer: Self-pay

## 2020-11-14 VITALS — BP 126/48 | HR 38 | Ht 70.0 in | Wt 176.0 lb

## 2020-11-14 DIAGNOSIS — E785 Hyperlipidemia, unspecified: Secondary | ICD-10-CM | POA: Diagnosis not present

## 2020-11-14 DIAGNOSIS — I119 Hypertensive heart disease without heart failure: Secondary | ICD-10-CM | POA: Diagnosis not present

## 2020-11-14 DIAGNOSIS — I2510 Atherosclerotic heart disease of native coronary artery without angina pectoris: Secondary | ICD-10-CM

## 2020-11-14 DIAGNOSIS — I214 Non-ST elevation (NSTEMI) myocardial infarction: Secondary | ICD-10-CM | POA: Diagnosis not present

## 2020-11-14 MED ORDER — MINOXIDIL 2.5 MG PO TABS
5.0000 mg | ORAL_TABLET | Freq: Two times a day (BID) | ORAL | 3 refills | Status: DC
Start: 2020-11-14 — End: 2021-05-08

## 2020-11-14 NOTE — Patient Instructions (Signed)
Medication Instructions:  Your physician recommends that you continue on your current medications as directed. Please refer to the Current Medication list given to you today.   Lab Work: None If you have labs (blood work) drawn today and your tests are completely normal, you will receive your results only by:  [place] (if you have MyChart) OR  A paper copy in the mail If you have any lab test that is abnormal or we need to change your treatment, we will call you to review the results.   Testing/Procedures: None  Follow-Up: At [facility], you and your health needs are our priority.  As part of our continuing mission to provide you with exceptional heart care, we have created designated Provider Care Teams.  These Care Teams include your primary Cardiologist (physician) and Advanced Practice Providers (APPs -  Physician Assistants and Nurse Practitioners) who all work together to provide you with the care you need, when you need it.  We recommend signing up for the patient portal called "MyChart".  Sign up information is provided on this After Visit Summary.  MyChart is used to connect with patients for Virtual Visits (Telemedicine).  Patients are able to view lab/test results, encounter notes, upcoming appointments, etc.  Non-urgent messages can be sent to your provider as well.   To learn more about what you can do with MyChart, go to [email or web address].    Your next appointment:   6 month(s)  The format for your next appointment:   In Person  Provider:   Shirlee More, MD   Other Instructions

## 2020-11-19 DIAGNOSIS — C218 Malignant neoplasm of overlapping sites of rectum, anus and anal canal: Secondary | ICD-10-CM | POA: Diagnosis not present

## 2020-11-19 DIAGNOSIS — Z923 Personal history of irradiation: Secondary | ICD-10-CM | POA: Diagnosis not present

## 2020-11-19 DIAGNOSIS — Z510 Encounter for antineoplastic radiation therapy: Secondary | ICD-10-CM | POA: Diagnosis not present

## 2020-11-19 DIAGNOSIS — C7800 Secondary malignant neoplasm of unspecified lung: Secondary | ICD-10-CM | POA: Diagnosis not present

## 2020-11-21 DIAGNOSIS — C218 Malignant neoplasm of overlapping sites of rectum, anus and anal canal: Secondary | ICD-10-CM | POA: Diagnosis not present

## 2020-11-21 DIAGNOSIS — Z923 Personal history of irradiation: Secondary | ICD-10-CM | POA: Diagnosis not present

## 2020-11-25 NOTE — Progress Notes (Signed)
[place]  [address] [place],  [place]  [address] [phone]  Clinic Day:  11/27/2020  Referring physician: Serita Grammes, MD   This document serves as a record of services personally performed by Hosie Poisson, MD. It was created on their behalf by Curry,Lauren E, a trained medical scribe. The creation of this record is based on the scribe's personal observations and the provider's statements to them.   CHIEF COMPLAINT:  CC: Stage IIIA rectosigmoid adenocarcinoma  Current Treatment:  Palliative radiation for a total of 13 doses to be followed by chemotherapy   HISTORY OF PRESENT ILLNESS:  Dakota Bennett is a 75 y.o. 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 This was found because of a positive Cologuard test, but he did have some rectal bleeding for several months.  Colonoscopy revealed an ulcerated mass at the proximal rectum at 9 cm and extending from the proximal rectum to the rectosigmoid junction.  An endoscopic ultrasound was done and confirmed a nonobstructing medium-sized mass of the proximal rectum involving 2/3 of the circumference and measuring 3 cm long, occurring 10 cm from the anal verge.  He also had 2 rounded suspicious perirectal lymph nodes measuring 5 and 6 mm.  CT scans of the abdomen and pelvis revealed several rounded nodules at the lung bases, which were tiny and largely unchanged from prior scans, so felt to likely be benign. CEA was elevated at 28.3.  He received neoadjuvant chemoradiation with 5 fluorouracil infusion for the 1st and 5th week of radiation and completed treatment the end of June.  He tolerated this fairly well, but had severe dysuria, as well as urinary hesitancy and soft stools.  We scheduled him for CT chest, abdomen and pelvis and referred him to Dr. Amalia Hailey for consideration of surgery.  However, his CT chest revealed several  pulmonary nodules, which had enlarged.  There was generalized wall thickening in the rectum, which could be due to the radiotherapy.  He underwent PET scan, which revealed increased uptake in the pulmonary nodules felt to be consistent with metastatic disease.  There was no evidence of persistence of disease in the rectum or other areas of metastasis.  His surgical plans were canceled.  The CEA was normal at that time. We obtained KRAS testing on his tumor, which unfortunately did show a mutation.  MMR testing of the tumor was normal.  We recommended palliative chemotherapy with FOLFOX/bevacizumab.  The patient had persistent thrombocytopenia, so we reduced his chemotherapy doses by a total of 25%.  Repeat imaging after 6 cycles of FOLFOX/bevacizumab was overall stable, there was some increase in the larger pulmonary nodules, however, they appeared necrotic indicating a response to therapy.   After 12 cycles of FOLFOX/bevacizumab restaging scans were obtained that showed the dominant left lower lobe lesion had substantially decreased in size and no new or progressive pulmonary nodule or masses were seen.  Oxaliplatin was discontinued due to neuropathy in January 2019.  Repeat CT imaging after 20 cycles revealed stable to decreased pulmonary nodules with no other evidence of metastasis.    He was continued on palliative chemotherapy with occasional changes in dosing and schedule.  He has had several episodes of rectal bleeding.  Dr. Lyndel Safe performed sigmoidoscopy in March 2020 which did not reveal any evidence of disease in the rectum.  He had worsening neuropathy of the feet, so was started on gabapentin 300 mg  at bedtime and the dose was increased but later stopped.  While having a 39th cycle of infusional 5-fluorouracil, he contacted [place] to report episodes of chest pain since starting his treatment with radiation down the arms and an episode of emesis.  He was referred to the emergency department and found to have  had a non STEM in March 2020I.  His 5 FU chemotherapy pump was discontinued.  He was transferred to [facility] for further evaluation and treatment, and had 3 stents placed in early April. Per Dr. Joya Gaskins telehealth note, the patient had residual LAD disease and recommended he be treated medically unless he has refractory angina, as he would require atherectomy of a proximal LAD lesion.  Dr. Bettina Gavia recommended discontinuing the gabapentin.  He placed the patient back on magnesium oxide 400 mg daily.  He was also started on clopidogrel 75 mg daily, as well as isosorbide mononitrate 30 mg once a day, amlodipine 5 mg once a day, atorvastatin 80 mg once a day and aspirin 81 mg daily.  Chemotherapy was held due to his recent non STEMI and resumed in May 2020.  Bevacizumab was discontinued due to recurrent rectal bleeding in June 2020 and the dose of chemotherapy was reduced by 10% due to worsening thrombocytopenia.  CT imaging in July 2020 revealed a mild interval increase in size of the extensive bilateral pulmonary nodules, as well as increased wall thickening of the rectum.  He also was found to have increasing splenomegaly.  His hemoglobin had also dropped to 7.8, and his platelet count had dropped to 80,000.  We therefore stopped his treatment due to progressive disease, as well as worsening anemia and thrombocytopenia.  He had evidence of recurrent iron deficiency, so received IV iron in July with improvement in his anemia.  Since July of 2020 he has had slow steady progression of his lung metastases but has remained asymptomatic.  His CEA has steadily increased as well.  CT chest, abdomen and pelvis from October 2021 revealed mild progression of the diffuse bilateral pulmonary metastases and a new 11 mm left mediastinal lymph node.  Index nodule in the medial left lower lobe currently measures 3.4 x 2.7 cm, compared to 2.8 x 2.2 cm previously.  Otherwise, the exam was stable.  CEA from October was  457.0.  Dakota Bennett presented to [facility] on November 22nd due to 5 episodes of bright red blood per rectum with dark clotting that day and was admitted.  His hemoglobin dropped down to 6 during his stay and he received 2 units of PRBCs on November 24th.  He was advised to hold Plavix.  He underwent a flexible sigmoidoscopy on November 25th revealing a single small ulcer with a prominent visble vessel in the distal rectum. For hemostasis, one hemostatic clip was successfully placed. There was no bleeding at the end of the procedure.  A fungating and ulcerated non-obstructing mass was found in the rectum. The mass was traversable, circumferential and measured five cm in length. There was mild contact oozing present. This was biopsied and surgical pathology was consistent with adenocarcinoma.  The sigmoid colon mucosa proximal to the mass was otherwise normal appearing.  Non-bleeding small internal hemorrhoids were found during retroflexion.  His hemoglobin was up to 9.3 when I saw him in December.  At that time, we discussed options of palliation and referred him to Dr. Orlene Erm for some additional radiation.  INTERVAL HISTORY:  Dakota Bennett is here for follow up after contracting COVID-19 in  December 2021.  He denies shortness of breath, cough, dyspnea or chest pain.  He notes some rectal pain, but denies any bleeding.  He will receive his first dose of radiation this morning for a total of 13 planned treatments.  He continues oral iron supplement and laxative daily.  His hemoglobin has increased from 9.5 to 11.2, his platelet count has mildly decreased from 111,000 to 103,000, and his platelet count is normal.  Chemistries are unremarkable.  His  appetite is good, and he has lost 2 pounds since his last visit.  He denies fever, chills or other signs of infection.  He denies nausea, vomiting, bowel issues, or abdominal pain.   REVIEW OF SYSTEMS:  Review of Systems  Constitutional: Negative.   HENT:  Negative.    Eyes: Negative.   Respiratory: Negative.   Cardiovascular: Negative.   Gastrointestinal: Positive for rectal pain. Negative for blood in stool.  Endocrine: Negative.   Genitourinary: Negative.    Musculoskeletal: Negative.   Skin: Negative.   Neurological: Negative.   Hematological: Negative.   Psychiatric/Behavioral: Negative.   All other systems reviewed and are negative.    VITALS:  Blood pressure (!) 151/68, pulse 65, temperature 97.8 F (36.6 C), temperature source Oral, resp. rate 18, height 5' 10"  (1.778 m), weight 178 lb 6.4 oz (80.9 kg), SpO2 94 %.  Wt Readings from Last 3 Encounters:  11/27/20 178 lb 6.4 oz (80.9 kg)  11/14/20 176 lb (79.8 kg)  10/23/20 180 lb 14.4 oz (82.1 kg)    Body mass index is 25.6 kg/m.  Performance status (ECOG): 1 - Symptomatic but completely ambulatory  PHYSICAL EXAM:  Physical Exam Constitutional:      General: He is not in acute distress.    Appearance: Normal appearance. He is normal weight.  HENT:     Head: Normocephalic and atraumatic.  Eyes:     General: No scleral icterus.    Extraocular Movements: Extraocular movements intact.     Conjunctiva/sclera: Conjunctivae normal.     Pupils: Pupils are equal, round, and reactive to light.  Cardiovascular:     Rate and Rhythm: Normal rate and regular rhythm.     Pulses: Normal pulses.     Heart sounds: Normal heart sounds. No murmur heard. No friction rub. No gallop.   Pulmonary:     Effort: Pulmonary effort is normal. No respiratory distress.     Breath sounds: Normal breath sounds.  Abdominal:     General: Bowel sounds are normal. There is no distension.     Palpations: Abdomen is soft. There is no mass.     Tenderness: There is no abdominal tenderness.  Musculoskeletal:        General: Normal range of motion.     Cervical back: Normal range of motion and neck supple.     Right lower leg: No edema.     Left lower leg: No edema.  Lymphadenopathy:     Cervical: No cervical  adenopathy.  Skin:    General: Skin is warm and dry.  Neurological:     General: No focal deficit present.     Mental Status: He is alert and oriented to person, place, and time. Mental status is at baseline.  Psychiatric:        Mood and Affect: Mood normal.        Behavior: Behavior normal.        Thought Content: Thought content normal.        Judgment: Judgment normal.  LABS:   CBC Latest Ref Rng & Units 10/23/2020 10/18/2020 10/17/2020  WBC - 4.3 5.4 5.4  Hemoglobin 13.5 - 17.5 9.5(A) 8.0(L) 9.0(L)  Hematocrit 41 - 53 29(A) 25.6(L) 28.5(L)  Platelets 150 - 399 111(A) 91(L) 98(L)   CMP Latest Ref Rng & Units 10/23/2020 10/17/2020 10/16/2020  Glucose 70 - 99 mg/dL - 142(H) 178(H)  BUN 4 - 21 8 6(L) 10  Creatinine 0.6 - 1.3 0.7 0.80 0.82  Sodium 137 - 147 134(A) 139 137  Potassium 3.4 - 5.3 3.9 3.6 3.5  Chloride 99 - 108 101 107 103  CO2 13 - 22 23(A) 24 22  Calcium 8.7 - 10.7 8.5(A) 8.3(L) 8.0(L)  Total Protein 6.5 - 8.1 g/dL - - -  Total Bilirubin 0.3 - 1.2 mg/dL - - -  Alkaline Phos 25 - 125 135(A) - -  AST 14 - 40 40 - -  ALT 10 - 40 35 - -     Lab Results  Component Value Date   CEA1 320.0 (H) 10/23/2020   /  CEA  Date Value Ref Range Status  10/23/2020 320.0 (H) 0.0 - 4.7 ng/mL Final    Comment:    (NOTE)                             Nonsmokers          <3.9                             Smokers             <5.6 [employer] Electrochemiluminescence Immunoassay (ECLIA) Values obtained with different assay methods or kits cannot be used interchangeably.  Results cannot be interpreted as absolute evidence of the presence or absence of malignant disease. Performed At: [facility] [place], [place] [id number] Rush Farmer MD EG:[id number]      STUDIES:   No current studies  Allergies:  Allergies  Allergen Reactions  . Lisinopril Hives       . Adhesive [Tape] Rash    Bandaids  . Aspirin Other (See Comments)     Burns stomach   . Neosporin [Neomycin-Bacitracin Zn-Polymyx] Rash    Current Medications: Current Outpatient Medications  Medication Sig Dispense Refill  . atorvastatin (LIPITOR) 80 MG tablet Take 1 tablet (80 mg total) by mouth daily at 6 PM. 30 tablet 0  . clopidogrel (PLAVIX) 75 MG tablet Take 1 tablet (75 mg total) by mouth daily with breakfast. 30 tablet 0  . ezetimibe (ZETIA) 10 MG tablet TAKE 1 TABLET BY MOUTH EVERY DAY 90 tablet 3  . famotidine (PEPCID) 20 MG tablet Take 1 tablet (20 mg total) by mouth as needed for heartburn or indigestion. 30 tablet 0  . ferrous sulfate 325 (65 FE) MG tablet Take 1 tablet (325 mg total) by mouth daily. 30 tablet 3  . gabapentin (NEURONTIN) 300 MG capsule Take 300 mg by mouth at bedtime.    . hydrALAZINE (APRESOLINE) 25 MG tablet TAKE 1 TABLET BY MOUTH THREE TIMES A DAY 270 tablet 1  . isosorbide mononitrate (IMDUR) 30 MG 24 hr tablet Take 1 tablet (30 mg total) by mouth daily. 30 tablet 0  . metFORMIN (GLUCOPHAGE-XR) 500 MG 24 hr tablet Take 500 mg by mouth in the morning and at bedtime.     . metoprolol succinate (TOPROL-XL) 100 MG 24 hr tablet Take  100 mg by mouth daily. Take with or immediately following a meal.    . minoxidil (LONITEN) 2.5 MG tablet Take 2 tablets (5 mg total) by mouth 2 (two) times daily. 360 tablet 3  . nitroGLYCERIN (NITROSTAT) 0.4 MG SL tablet Place 1 tablet (0.4 mg total) under the tongue every 5 (five) minutes as needed for chest pain. 25 tablet 3  . pantoprazole (PROTONIX) 40 MG tablet TAKE 1 TABLET BY MOUTH EVERY DAY (Patient taking differently: as needed.) 90 tablet 1  . polyethylene glycol (MIRALAX / GLYCOLAX) 17 g packet Take 17 g by mouth daily. 14 each 0  . Potassium Chloride ER 20 MEQ TBCR Take 20 mEq by mouth daily.    . prochlorperazine (COMPAZINE) 10 MG tablet Take 10 mg by mouth 3 (three) times daily as needed for nausea/vomiting.     No current facility-administered medications for this visit.      ASSESSMENT & PLAN:   Assessment:   1. Metastatic colorectal cancer to the lung.  He had mild progression of disease on palliative infusional 5 fluorouracil/leucovorin/bevacizumab every 3 weeks, so was switched back to infusional 5 fluorouracil/leucovorin/bevacizumab every 2 weeks.  He had minimal progression of disease from February to July 2020, so we continued the same regimen.  Bevacizumab was discontinued in May 2020 due to rectal bleeding.  CT imaging in July 2020 revealed progressive disease both in the lungs and thickening of the rectum, so chemotherapy was discontinued and the patient was placed on observation.  He has had continued slow progression of his disease on imaging, as well as an increasing CEA.  CT imaging in October revealed progressive pulmonary metastasis and a new 11 mm left mediastinal lymph node.  The patient at that time desired to continue observation, but is willing to get back on treatment if his disease significantly worsens.  As he now has a confirmed malignant rectal mass he is receiving palliative radiation.    2.  Rectal cancer which has not been resected due to the metastatic disease.  He has had intermittent bleeding but CT scan just shows thickening in that area.  I discussed this with his surgeon Dr. Kendell Bane, and he feels that this would be a major surgical undertaking with high risk and small benefit since he still has progressive metastatic disease elsewhere.  He is receiving palliative radiation.  3. Persistent neuropathy.  He is using gabapentin.  4. Anemia, which has improved.  He continues ferrous sulfate once daily.  5. Thrombocytopenia, secondary to chemotherapy and splenomegaly, which has mildly worsened.  6. Splenomegaly on CT imaging, which is likely contributing to his thrombocytopenia.  I cannot palpate the spleen on physical exam.    7. History of non-ST elevation myocardial infarction secondary to coronary disease.  He had 3 coronary  artery stents placed in April 2020.   8.  Hospitalization due to significant GI bleed in November 2021.  His hemoglobin dropped down to 6 at that time, and he received 2 units of PRBCs.  Sigmoidoscopy confirmed a 5 cm fungating and ulcerated non-obstructing mass in the rectum and biopsy was consistent with adenocarcinoma.    Plan: He started palliative radiation today for a total of 13 treatments.  I would recommend chemotherapy to control his disease if he wishes to be aggressive, and he is leaning towards pursuing this.  We will have him completed radiation therapy first, so we will see him back in 3 weeks with CBC and CMP for further treatment planning.  He will probably need a break before initiating chemotherapy, and we will most likely go with FOLFIRI with reduced dosing.  The patient understands the plans discussed today and is in agreement with them.  He knows to contact our office if he develops concerns prior to his next appointment.   I provided 25 minutes of face-to-face time during this this encounter and > 50% was spent counseling as documented under my assessment and plan.    Derwood Kaplan, MD [facility] [address] [place] [address] Dept: [phone] Dept Fax: [phone]   I, Rita Ohara, am acting as scribe for Derwood Kaplan, MD  I have reviewed this report as typed by the medical scribe, and it is complete and accurate.

## 2020-11-27 ENCOUNTER — Inpatient Hospital Stay: Payer: Medicare Other | Attending: Oncology

## 2020-11-27 ENCOUNTER — Telehealth: Payer: Self-pay | Admitting: Oncology

## 2020-11-27 ENCOUNTER — Other Ambulatory Visit: Payer: Self-pay | Admitting: Oncology

## 2020-11-27 ENCOUNTER — Other Ambulatory Visit: Payer: Self-pay

## 2020-11-27 ENCOUNTER — Other Ambulatory Visit: Payer: Self-pay | Admitting: Hematology and Oncology

## 2020-11-27 ENCOUNTER — Encounter: Payer: Self-pay | Admitting: Oncology

## 2020-11-27 ENCOUNTER — Inpatient Hospital Stay (INDEPENDENT_AMBULATORY_CARE_PROVIDER_SITE_OTHER): Payer: Medicare Other | Admitting: Oncology

## 2020-11-27 VITALS — BP 151/68 | HR 65 | Temp 97.8°F | Resp 18 | Ht 70.0 in | Wt 178.4 lb

## 2020-11-27 DIAGNOSIS — C7801 Secondary malignant neoplasm of right lung: Secondary | ICD-10-CM

## 2020-11-27 DIAGNOSIS — D649 Anemia, unspecified: Secondary | ICD-10-CM | POA: Diagnosis not present

## 2020-11-27 DIAGNOSIS — I252 Old myocardial infarction: Secondary | ICD-10-CM | POA: Diagnosis not present

## 2020-11-27 DIAGNOSIS — C7800 Secondary malignant neoplasm of unspecified lung: Secondary | ICD-10-CM | POA: Diagnosis not present

## 2020-11-27 DIAGNOSIS — C20 Malignant neoplasm of rectum: Secondary | ICD-10-CM

## 2020-11-27 DIAGNOSIS — G629 Polyneuropathy, unspecified: Secondary | ICD-10-CM | POA: Insufficient documentation

## 2020-11-27 DIAGNOSIS — C7802 Secondary malignant neoplasm of left lung: Secondary | ICD-10-CM | POA: Insufficient documentation

## 2020-11-27 DIAGNOSIS — Z923 Personal history of irradiation: Secondary | ICD-10-CM | POA: Insufficient documentation

## 2020-11-27 DIAGNOSIS — Z9221 Personal history of antineoplastic chemotherapy: Secondary | ICD-10-CM | POA: Diagnosis not present

## 2020-11-27 DIAGNOSIS — D6959 Other secondary thrombocytopenia: Secondary | ICD-10-CM | POA: Diagnosis not present

## 2020-11-27 DIAGNOSIS — R161 Splenomegaly, not elsewhere classified: Secondary | ICD-10-CM | POA: Insufficient documentation

## 2020-11-27 DIAGNOSIS — D701 Agranulocytosis secondary to cancer chemotherapy: Secondary | ICD-10-CM

## 2020-11-27 DIAGNOSIS — C19 Malignant neoplasm of rectosigmoid junction: Secondary | ICD-10-CM | POA: Insufficient documentation

## 2020-11-27 DIAGNOSIS — Z510 Encounter for antineoplastic radiation therapy: Secondary | ICD-10-CM | POA: Diagnosis not present

## 2020-11-27 DIAGNOSIS — C218 Malignant neoplasm of overlapping sites of rectum, anus and anal canal: Secondary | ICD-10-CM | POA: Diagnosis not present

## 2020-11-27 LAB — COMPREHENSIVE METABOLIC PANEL
Albumin: 3.9 (ref 3.5–5.0)
Calcium: 9.3 (ref 8.7–10.7)

## 2020-11-27 LAB — HEPATIC FUNCTION PANEL
ALT: 24 (ref 10–40)
AST: 29 (ref 14–40)
Alkaline Phosphatase: 120 (ref 25–125)
Bilirubin, Total: 0.8

## 2020-11-27 LAB — CBC: RBC: 4.29 (ref 3.87–5.11)

## 2020-11-27 LAB — BASIC METABOLIC PANEL
BUN: 12 (ref 4–21)
CO2: 24 — AB (ref 13–22)
Creatinine: 0.6 (ref 0.6–1.3)
Glucose: 138
Potassium: 3.9 (ref 3.4–5.3)
Sodium: 137 (ref 137–147)

## 2020-11-27 LAB — CBC AND DIFFERENTIAL
HCT: 35 — AB (ref 41–53)
Hemoglobin: 11.2 — AB (ref 13.5–17.5)
Neutrophils Absolute: 4.03
Platelets: 103 — AB (ref 150–399)
WBC: 5.6

## 2020-11-27 MED ORDER — PROCHLORPERAZINE MALEATE 10 MG PO TABS: 10.0000 mg | ORAL_TABLET | Freq: Four times a day (QID) | ORAL | 5 refills | Status: DC | PRN

## 2020-11-27 NOTE — Telephone Encounter (Signed)
Per 1/5 los next appt given to patient 

## 2020-11-28 DIAGNOSIS — C218 Malignant neoplasm of overlapping sites of rectum, anus and anal canal: Secondary | ICD-10-CM | POA: Diagnosis not present

## 2020-11-28 DIAGNOSIS — Z510 Encounter for antineoplastic radiation therapy: Secondary | ICD-10-CM | POA: Diagnosis not present

## 2020-11-28 DIAGNOSIS — Z923 Personal history of irradiation: Secondary | ICD-10-CM | POA: Diagnosis not present

## 2020-11-28 DIAGNOSIS — C7800 Secondary malignant neoplasm of unspecified lung: Secondary | ICD-10-CM | POA: Diagnosis not present

## 2020-11-28 LAB — CEA: CEA: 397 ng/mL — ABNORMAL HIGH (ref 0.0–4.7)

## 2020-11-29 DIAGNOSIS — Z510 Encounter for antineoplastic radiation therapy: Secondary | ICD-10-CM | POA: Diagnosis not present

## 2020-11-29 DIAGNOSIS — Z923 Personal history of irradiation: Secondary | ICD-10-CM | POA: Diagnosis not present

## 2020-11-29 DIAGNOSIS — C7800 Secondary malignant neoplasm of unspecified lung: Secondary | ICD-10-CM | POA: Diagnosis not present

## 2020-11-29 DIAGNOSIS — C218 Malignant neoplasm of overlapping sites of rectum, anus and anal canal: Secondary | ICD-10-CM | POA: Diagnosis not present

## 2020-12-02 DIAGNOSIS — Z923 Personal history of irradiation: Secondary | ICD-10-CM | POA: Diagnosis not present

## 2020-12-02 DIAGNOSIS — Z510 Encounter for antineoplastic radiation therapy: Secondary | ICD-10-CM | POA: Diagnosis not present

## 2020-12-02 DIAGNOSIS — C7800 Secondary malignant neoplasm of unspecified lung: Secondary | ICD-10-CM | POA: Diagnosis not present

## 2020-12-02 DIAGNOSIS — C218 Malignant neoplasm of overlapping sites of rectum, anus and anal canal: Secondary | ICD-10-CM | POA: Diagnosis not present

## 2020-12-03 DIAGNOSIS — C7800 Secondary malignant neoplasm of unspecified lung: Secondary | ICD-10-CM | POA: Diagnosis not present

## 2020-12-03 DIAGNOSIS — Z923 Personal history of irradiation: Secondary | ICD-10-CM | POA: Diagnosis not present

## 2020-12-03 DIAGNOSIS — C218 Malignant neoplasm of overlapping sites of rectum, anus and anal canal: Secondary | ICD-10-CM | POA: Diagnosis not present

## 2020-12-03 DIAGNOSIS — Z510 Encounter for antineoplastic radiation therapy: Secondary | ICD-10-CM | POA: Diagnosis not present

## 2020-12-04 DIAGNOSIS — Z923 Personal history of irradiation: Secondary | ICD-10-CM | POA: Diagnosis not present

## 2020-12-04 DIAGNOSIS — C7800 Secondary malignant neoplasm of unspecified lung: Secondary | ICD-10-CM | POA: Diagnosis not present

## 2020-12-04 DIAGNOSIS — C218 Malignant neoplasm of overlapping sites of rectum, anus and anal canal: Secondary | ICD-10-CM | POA: Diagnosis not present

## 2020-12-04 DIAGNOSIS — Z510 Encounter for antineoplastic radiation therapy: Secondary | ICD-10-CM | POA: Diagnosis not present

## 2020-12-05 DIAGNOSIS — Z510 Encounter for antineoplastic radiation therapy: Secondary | ICD-10-CM | POA: Diagnosis not present

## 2020-12-05 DIAGNOSIS — C7800 Secondary malignant neoplasm of unspecified lung: Secondary | ICD-10-CM | POA: Diagnosis not present

## 2020-12-05 DIAGNOSIS — Z923 Personal history of irradiation: Secondary | ICD-10-CM | POA: Diagnosis not present

## 2020-12-05 DIAGNOSIS — C218 Malignant neoplasm of overlapping sites of rectum, anus and anal canal: Secondary | ICD-10-CM | POA: Diagnosis not present

## 2020-12-06 DIAGNOSIS — Z510 Encounter for antineoplastic radiation therapy: Secondary | ICD-10-CM | POA: Diagnosis not present

## 2020-12-06 DIAGNOSIS — C7800 Secondary malignant neoplasm of unspecified lung: Secondary | ICD-10-CM | POA: Diagnosis not present

## 2020-12-06 DIAGNOSIS — C218 Malignant neoplasm of overlapping sites of rectum, anus and anal canal: Secondary | ICD-10-CM | POA: Diagnosis not present

## 2020-12-06 DIAGNOSIS — Z923 Personal history of irradiation: Secondary | ICD-10-CM | POA: Diagnosis not present

## 2020-12-09 DIAGNOSIS — Z510 Encounter for antineoplastic radiation therapy: Secondary | ICD-10-CM | POA: Diagnosis not present

## 2020-12-09 DIAGNOSIS — C218 Malignant neoplasm of overlapping sites of rectum, anus and anal canal: Secondary | ICD-10-CM | POA: Diagnosis not present

## 2020-12-09 DIAGNOSIS — C7800 Secondary malignant neoplasm of unspecified lung: Secondary | ICD-10-CM | POA: Diagnosis not present

## 2020-12-09 DIAGNOSIS — Z923 Personal history of irradiation: Secondary | ICD-10-CM | POA: Diagnosis not present

## 2020-12-10 DIAGNOSIS — Z923 Personal history of irradiation: Secondary | ICD-10-CM | POA: Diagnosis not present

## 2020-12-10 DIAGNOSIS — Z510 Encounter for antineoplastic radiation therapy: Secondary | ICD-10-CM | POA: Diagnosis not present

## 2020-12-10 DIAGNOSIS — C218 Malignant neoplasm of overlapping sites of rectum, anus and anal canal: Secondary | ICD-10-CM | POA: Diagnosis not present

## 2020-12-10 DIAGNOSIS — C7800 Secondary malignant neoplasm of unspecified lung: Secondary | ICD-10-CM | POA: Diagnosis not present

## 2020-12-11 DIAGNOSIS — Z923 Personal history of irradiation: Secondary | ICD-10-CM | POA: Diagnosis not present

## 2020-12-11 DIAGNOSIS — C7800 Secondary malignant neoplasm of unspecified lung: Secondary | ICD-10-CM | POA: Diagnosis not present

## 2020-12-11 DIAGNOSIS — C218 Malignant neoplasm of overlapping sites of rectum, anus and anal canal: Secondary | ICD-10-CM | POA: Diagnosis not present

## 2020-12-11 DIAGNOSIS — Z510 Encounter for antineoplastic radiation therapy: Secondary | ICD-10-CM | POA: Diagnosis not present

## 2020-12-12 DIAGNOSIS — Z923 Personal history of irradiation: Secondary | ICD-10-CM | POA: Diagnosis not present

## 2020-12-12 DIAGNOSIS — C7800 Secondary malignant neoplasm of unspecified lung: Secondary | ICD-10-CM | POA: Diagnosis not present

## 2020-12-12 DIAGNOSIS — Z510 Encounter for antineoplastic radiation therapy: Secondary | ICD-10-CM | POA: Diagnosis not present

## 2020-12-12 DIAGNOSIS — C218 Malignant neoplasm of overlapping sites of rectum, anus and anal canal: Secondary | ICD-10-CM | POA: Diagnosis not present

## 2020-12-13 DIAGNOSIS — Z923 Personal history of irradiation: Secondary | ICD-10-CM | POA: Diagnosis not present

## 2020-12-13 DIAGNOSIS — Z510 Encounter for antineoplastic radiation therapy: Secondary | ICD-10-CM | POA: Diagnosis not present

## 2020-12-13 DIAGNOSIS — C218 Malignant neoplasm of overlapping sites of rectum, anus and anal canal: Secondary | ICD-10-CM | POA: Diagnosis not present

## 2020-12-13 DIAGNOSIS — C7800 Secondary malignant neoplasm of unspecified lung: Secondary | ICD-10-CM | POA: Diagnosis not present

## 2020-12-17 NOTE — Progress Notes (Signed)
[name]  [address] [place],  [place]  [address] [phone]  Clinic Day:  12/19/2020  Referring physician: Serita Grammes, MD   This document serves as a record of services personally performed by Hosie Poisson, MD. It was created on their behalf by Curry,Lauren E, a trained medical scribe. The creation of this record is based on the scribe's personal observations and the provider's statements to them.   CHIEF COMPLAINT:  CC: Stage IIIA rectosigmoid adenocarcinoma  Current Treatment:  Palliative radiation for a total of 13 doses to be followed by chemotherapy   HISTORY OF PRESENT ILLNESS:  Dakota Bennett is a 75 y.o. 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 This was found because of a positive Cologuard test, but he did have some rectal bleeding for several months.  Colonoscopy revealed an ulcerated mass at the proximal rectum at 9 cm and extending from the proximal rectum to the rectosigmoid junction.  An endoscopic ultrasound was done and confirmed a nonobstructing medium-sized mass of the proximal rectum involving 2/3 of the circumference and measuring 3 cm long, occurring 10 cm from the anal verge.  He also had 2 rounded suspicious perirectal lymph nodes measuring 5 and 6 mm.  CT scans of the abdomen and pelvis revealed several rounded nodules at the lung bases, which were tiny and largely unchanged from prior scans, so felt to likely be benign. CEA was elevated at 28.3.  He received neoadjuvant chemoradiation with 5 fluorouracil infusion for the 1st and 5th week of radiation and completed treatment the end of June.  He tolerated this fairly well, but had severe dysuria, as well as urinary hesitancy and soft stools.  We scheduled him for CT chest, abdomen and pelvis and referred him to Dr. Amalia Hailey for consideration of surgery.  However, his CT chest revealed several  pulmonary nodules, which had enlarged.  There was generalized wall thickening in the rectum, which could be due to the radiotherapy.  He underwent PET scan, which revealed increased uptake in the pulmonary nodules felt to be consistent with metastatic disease.  There was no evidence of persistence of disease in the rectum or other areas of metastasis.  His surgical plans were canceled.  The CEA was normal at that time. We obtained KRAS testing on his tumor, which unfortunately did show a mutation.  MMR testing of the tumor was normal.  We recommended palliative chemotherapy with FOLFOX/bevacizumab.  The patient had persistent thrombocytopenia, so we reduced his chemotherapy doses by a total of 25%.  Repeat imaging after 6 cycles of FOLFOX/bevacizumab was overall stable, there was some increase in the larger pulmonary nodules, however, they appeared necrotic indicating a response to therapy.   After 12 cycles of FOLFOX/bevacizumab restaging scans were obtained that showed the dominant left lower lobe lesion had substantially decreased in size and no new or progressive pulmonary nodule or masses were seen.  Oxaliplatin was discontinued due to neuropathy in January 2019.  Repeat CT imaging after 20 cycles revealed stable to decreased pulmonary nodules with no other evidence of metastasis.    He was continued on palliative chemotherapy with occasional changes in dosing and schedule.  He has had several episodes of rectal bleeding.  Dr. Lyndel Safe performed sigmoidoscopy in March 2020 which did not reveal any evidence of disease in the rectum.  He had worsening neuropathy of the feet, so was started on gabapentin 300 mg  at bedtime and the dose was increased but later stopped.  While having a 39th cycle of infusional 5-fluorouracil, he contacted [place] to report episodes of chest pain since starting his treatment with radiation down the arms and an episode of emesis.  He was referred to the emergency department and found to have  had a non STEM in March 2020I.  His 5 FU chemotherapy pump was discontinued.  He was transferred to [facility] for further evaluation and treatment, and had 3 stents placed in early April. Per Dr. Joya Gaskins telehealth note, the patient had residual LAD disease and recommended he be treated medically unless he has refractory angina, as he would require atherectomy of a proximal LAD lesion.  Dr. Bettina Gavia recommended discontinuing the gabapentin.  He placed the patient back on magnesium oxide 400 mg daily.  He was also started on clopidogrel 75 mg daily, as well as isosorbide mononitrate 30 mg once a day, amlodipine 5 mg once a day, atorvastatin 80 mg once a day and aspirin 81 mg daily.  Chemotherapy was held due to his recent non STEMI and resumed in May 2020.  Bevacizumab was discontinued due to recurrent rectal bleeding in June 2020 and the dose of chemotherapy was reduced by 10% due to worsening thrombocytopenia.  CT imaging in July 2020 revealed a mild interval increase in size of the extensive bilateral pulmonary nodules, as well as increased wall thickening of the rectum.  He also was found to have increasing splenomegaly.  His hemoglobin had also dropped to 7.8, and his platelet count had dropped to 80,000.  We therefore stopped his treatment due to progressive disease, as well as worsening anemia and thrombocytopenia.  He had evidence of recurrent iron deficiency, so received IV iron in July with improvement in his anemia.  Since July of 2020 he has had slow steady progression of his lung metastases but has remained asymptomatic.  His CEA has steadily increased as well.  CT chest, abdomen and pelvis from October 2021 revealed mild progression of the diffuse bilateral pulmonary metastases and a new 11 mm left mediastinal lymph node.  Index nodule in the medial left lower lobe currently measures 3.4 x 2.7 cm, compared to 2.8 x 2.2 cm previously.  Otherwise, the exam was stable.  CEA from October was  457.0.  Dakota Bennett presented to [facility] on November 22nd due to 5 episodes of bright red blood per rectum with dark clotting that day and was admitted.  His hemoglobin dropped down to 6 during his stay and he received 2 units of PRBCs on November 24th.  He was advised to hold Plavix.  He underwent a flexible sigmoidoscopy on November 25th revealing a single small ulcer with a prominent visble vessel in the distal rectum. For hemostasis, one hemostatic clip was successfully placed. There was no bleeding at the end of the procedure.  A fungating and ulcerated non-obstructing mass was found in the rectum. The mass was traversable, circumferential and measured five cm in length. There was mild contact oozing present. This was biopsied and surgical pathology was consistent with adenocarcinoma.  The sigmoid colon mucosa proximal to the mass was otherwise normal appearing.  Non-bleeding small internal hemorrhoids were found during retroflexion.  His hemoglobin was up to 9.3 when I saw him in December.  At that time, we discussed options of palliation and referred him to Dr. Orlene Erm for some additional radiation.  He had COVID infection is December 2021.  INTERVAL HISTORY:  Dakota Bennett is  here for follow up after completing a 3250 cGy course of radiation, a total of 13 fractions, on January 21st.  He notes intermittent rectal pain which can be severe, 10/10, and has been using ibuprofen with mild relief.  He is also on gabapentin.  He reports a rectal mucus discharge with a red tint and red flecks, but denies bloody stools.  He is using Imodium as needed.  He was having mild nausea during treatment but this has resolved.  He is not currently on oral diuretics.  He has occasional chest pain with exertion and uses nitroglycerin when this occurs.  His hemoglobin has mildly decreased from 11.2 to 10.6, his platelet count has decreased from 103,000 to 89,000, and his white count is normal.  Chemistries are unremarkable  except for mildly elevated liver transaminases.  His  appetite is good, and he has gained 3 pounds since his last visit.  He denies fever, chills or other signs of infection.  He denies nausea, vomiting, bowel issues, or abdominal pain.  He denies sore throat, cough, dyspnea, or chest pain.  REVIEW OF SYSTEMS:  Review of Systems  Constitutional: Negative.   HENT:  Negative.   Eyes: Negative.   Respiratory: Negative.   Cardiovascular: Negative.   Gastrointestinal: Positive for rectal pain (can be severe). Negative for blood in stool.  Endocrine: Negative.   Genitourinary: Negative.    Musculoskeletal: Negative.   Skin: Negative.   Neurological: Negative.   Hematological: Negative.   Psychiatric/Behavioral: Negative.   All other systems reviewed and are negative.    VITALS:  Blood pressure 137/61, pulse 70, temperature (!) 97.5 F (36.4 C), temperature source Oral, resp. rate 18, height 5' 10"  (1.778 m), weight 182 lb 3.2 oz (82.6 kg), SpO2 94 %.  Wt Readings from Last 3 Encounters:  12/19/20 182 lb 3.2 oz (82.6 kg)  11/27/20 178 lb 6.4 oz (80.9 kg)  11/14/20 176 lb (79.8 kg)    Body mass index is 26.14 kg/m.  Performance status (ECOG): 1 - Symptomatic but completely ambulatory  PHYSICAL EXAM:  Physical Exam Constitutional:      General: He is not in acute distress.    Appearance: Normal appearance. He is normal weight.  HENT:     Head: Normocephalic and atraumatic.  Eyes:     General: No scleral icterus.    Extraocular Movements: Extraocular movements intact.     Conjunctiva/sclera: Conjunctivae normal.     Pupils: Pupils are equal, round, and reactive to light.  Cardiovascular:     Rate and Rhythm: Normal rate and regular rhythm.     Pulses: Normal pulses.     Heart sounds: Normal heart sounds. No murmur heard. No friction rub. No gallop.   Pulmonary:     Effort: Pulmonary effort is normal. No respiratory distress.     Breath sounds: Normal breath sounds.   Abdominal:     General: Bowel sounds are normal. There is no distension.     Palpations: Abdomen is soft. There is no mass.     Tenderness: There is no abdominal tenderness.  Musculoskeletal:        General: Normal range of motion.     Cervical back: Normal range of motion and neck supple.     Right lower leg: No edema.     Left lower leg: No edema.  Lymphadenopathy:     Cervical: No cervical adenopathy.  Skin:    General: Skin is warm and dry.  Neurological:  General: No focal deficit present.     Mental Status: He is alert and oriented to person, place, and time. Mental status is at baseline.  Psychiatric:        Mood and Affect: Mood normal.        Behavior: Behavior normal.        Thought Content: Thought content normal.        Judgment: Judgment normal.    LABS:   CBC Latest Ref Rng & Units 11/27/2020 10/23/2020 10/18/2020  WBC - 5.6 4.3 5.4  Hemoglobin 13.5 - 17.5 11.2(A) 9.5(A) 8.0(L)  Hematocrit 41 - 53 35(A) 29(A) 25.6(L)  Platelets 150 - 399 103(A) 111(A) 91(L)   CMP Latest Ref Rng & Units 11/27/2020 10/23/2020 10/17/2020  Glucose 70 - 99 mg/dL - - 142(H)  BUN 4 - 21 12 8  6(L)  Creatinine 0.6 - 1.3 0.6 0.7 0.80  Sodium 137 - 147 137 134(A) 139  Potassium 3.4 - 5.3 3.9 3.9 3.6  Chloride 99 - 108 - 101 107  CO2 13 - 22 24(A) 23(A) 24  Calcium 8.7 - 10.7 9.3 8.5(A) 8.3(L)  Total Protein 6.5 - 8.1 g/dL - - -  Total Bilirubin 0.3 - 1.2 mg/dL - - -  Alkaline Phos 25 - 125 120 135(A) -  AST 14 - 40 29 40 -  ALT 10 - 40 24 35 -     Lab Results  Component Value Date   CEA1 397.0 (H) 11/27/2020   /  CEA  Date Value Ref Range Status  11/27/2020 397.0 (H) 0.0 - 4.7 ng/mL Final    Comment:    (NOTE)                             Nonsmokers          <3.9                             Smokers             <5.6 [employer] Electrochemiluminescence Immunoassay (ECLIA) Values obtained with different assay methods or kits cannot be used interchangeably.  Results  cannot be interpreted as absolute evidence of the presence or absence of malignant disease. Performed At: [facility] [place], [place] [id number] Rush Farmer MD NL:[id number]      STUDIES:   No current studies  Allergies:  Allergies  Allergen Reactions  . Lisinopril Hives       . Adhesive [Tape] Rash    Bandaids  . Aspirin Other (See Comments)    Burns stomach   . Neosporin [Neomycin-Bacitracin Zn-Polymyx] Rash    Current Medications: Current Outpatient Medications  Medication Sig Dispense Refill  . Diphenoxylate-Atropine (LOMOTIL PO) Take by mouth as needed.    Marland Kitchen atorvastatin (LIPITOR) 80 MG tablet Take 1 tablet (80 mg total) by mouth daily at 6 PM. 30 tablet 0  . clopidogrel (PLAVIX) 75 MG tablet Take 1 tablet (75 mg total) by mouth daily with breakfast. 30 tablet 0  . ezetimibe (ZETIA) 10 MG tablet TAKE 1 TABLET BY MOUTH EVERY DAY 90 tablet 3  . famotidine (PEPCID) 20 MG tablet Take 1 tablet (20 mg total) by mouth as needed for heartburn or indigestion. 30 tablet 0  . ferrous sulfate 325 (65 FE) MG tablet Take 1 tablet (325 mg total) by mouth daily. 30 tablet 3  . gabapentin (NEURONTIN)  300 MG capsule Take 300 mg by mouth at bedtime.    . hydrALAZINE (APRESOLINE) 25 MG tablet TAKE 1 TABLET BY MOUTH THREE TIMES A DAY 270 tablet 1  . isosorbide mononitrate (IMDUR) 30 MG 24 hr tablet Take 1 tablet (30 mg total) by mouth daily. 30 tablet 0  . metFORMIN (GLUCOPHAGE-XR) 500 MG 24 hr tablet Take 500 mg by mouth in the morning and at bedtime.     . metoprolol succinate (TOPROL-XL) 100 MG 24 hr tablet Take 100 mg by mouth daily. Take with or immediately following a meal.    . minoxidil (LONITEN) 2.5 MG tablet Take 2 tablets (5 mg total) by mouth 2 (two) times daily. 360 tablet 3  . nitroGLYCERIN (NITROSTAT) 0.4 MG SL tablet Place 1 tablet (0.4 mg total) under the tongue every 5 (five) minutes as needed for chest pain. 25 tablet 3  . pantoprazole  (PROTONIX) 40 MG tablet TAKE 1 TABLET BY MOUTH EVERY DAY (Patient taking differently: as needed.) 90 tablet 1  . polyethylene glycol (MIRALAX / GLYCOLAX) 17 g packet Take 17 g by mouth daily. 14 each 0  . Potassium Chloride ER 20 MEQ TBCR Take 20 mEq by mouth daily.    . prochlorperazine (COMPAZINE) 10 MG tablet Take 1 tablet (10 mg total) by mouth every 6 (six) hours as needed. 60 tablet 5   No current facility-administered medications for this visit.     ASSESSMENT & PLAN:   Assessment:   1. Metastatic colorectal cancer to the lung.  He had mild progression of disease on palliative infusional 5 fluorouracil/leucovorin/bevacizumab every 3 weeks, so was switched back to infusional 5 fluorouracil/leucovorin/bevacizumab every 2 weeks.  He had minimal progression of disease from February to July 2020, so we continued the same regimen.  Bevacizumab was discontinued in May 2020 due to rectal bleeding.  CT imaging in July 2020 revealed progressive disease both in the lungs and thickening of the rectum, so chemotherapy was discontinued and the patient was placed on observation.  He has had continued slow progression of his disease on imaging, as well as an increasing CEA.  CT imaging in October revealed progressive pulmonary metastasis and a new 11 mm left mediastinal lymph node.  The patient at that time desired to continue observation, but was willing to get back on treatment if his disease significantly worsened.  He completed palliative radiation on January 21st.     2.  Rectal cancer which has not been resected due to the metastatic disease.  He has had intermittent bleeding but CT scan just shows thickening in that area.  I discussed this with his surgeon Dr. Kendell Bane, and he feels that this would be a major surgical undertaking with high risk and small benefit since he still has progressive metastatic disease elsewhere.  He just completed palliative radiation.  3. Persistent neuropathy.  He is  using gabapentin.  4. Anemia, which has mildly worsened.  He continues ferrous sulfate once daily.  5. Thrombocytopenia, secondary to chemotherapy and splenomegaly, which has mildly worsened.  6. Splenomegaly on CT imaging, which is likely contributing to his thrombocytopenia.  I cannot palpate the spleen on physical exam.    7. History of non-ST elevation myocardial infarction secondary to coronary disease.  He had 3 coronary artery stents placed in April 2020.   8.  Hospitalization due to significant GI bleed in November 2021.  His hemoglobin dropped down to 6 at that time, and he received 2 units of PRBCs.  Sigmoidoscopy confirmed a 5 cm fungating and ulcerated non-obstructing mass in the rectum and biopsy was consistent with adenocarcinoma.  Therefore, he has received palliative radiation.  9.  Abnormal liver function tests, which is a new finding, but not severe.  We will monitor this and assess his liver in the upcoming scans.  Plan: He completed palliative radiation on January 21st and continues to have intermittent severe rectal pain and a rectal mucus discharge.  He has been using ibuprofen with mild relief.  I do recommend chemotherapy, most likely with FOLFIRI at reduced dosing, but we will wait to give him more time to recover from his radiation therapy.  This will also give him some time to make a decision on treatment.  We will see him back in 3 weeks with CBC, CMP, CEA and CT chest, abdomen and pelvis for further treatment planning.  The patient understands the plans discussed today and is in agreement with them.  He knows to contact our office if he develops concerns prior to his next appointment.   I provided 25 minutes of face-to-face time during this this encounter and > 50% was spent counseling as documented under my assessment and plan.    Derwood Kaplan, MD [facility] [address] [place]  [address] Dept: [phone] Dept Fax: [phone]   I, Rita Ohara, am acting as scribe for Derwood Kaplan, MD  I have reviewed this report as typed by the medical scribe, and it is complete and accurate.

## 2020-12-19 ENCOUNTER — Inpatient Hospital Stay (INDEPENDENT_AMBULATORY_CARE_PROVIDER_SITE_OTHER): Payer: Medicare Other | Admitting: Oncology

## 2020-12-19 ENCOUNTER — Other Ambulatory Visit: Payer: Self-pay | Admitting: Oncology

## 2020-12-19 ENCOUNTER — Telehealth: Payer: Self-pay | Admitting: Oncology

## 2020-12-19 ENCOUNTER — Other Ambulatory Visit: Payer: Self-pay | Admitting: Hematology and Oncology

## 2020-12-19 ENCOUNTER — Other Ambulatory Visit: Payer: Self-pay

## 2020-12-19 ENCOUNTER — Inpatient Hospital Stay: Payer: Medicare Other

## 2020-12-19 ENCOUNTER — Encounter: Payer: Self-pay | Admitting: Oncology

## 2020-12-19 VITALS — BP 137/61 | HR 70 | Temp 97.5°F | Resp 18 | Ht 70.0 in | Wt 182.2 lb

## 2020-12-19 DIAGNOSIS — C7801 Secondary malignant neoplasm of right lung: Secondary | ICD-10-CM | POA: Diagnosis not present

## 2020-12-19 DIAGNOSIS — C7800 Secondary malignant neoplasm of unspecified lung: Secondary | ICD-10-CM | POA: Diagnosis not present

## 2020-12-19 DIAGNOSIS — C7802 Secondary malignant neoplasm of left lung: Secondary | ICD-10-CM

## 2020-12-19 DIAGNOSIS — D649 Anemia, unspecified: Secondary | ICD-10-CM | POA: Diagnosis not present

## 2020-12-19 LAB — HEPATIC FUNCTION PANEL
ALT: 60 — AB (ref 10–40)
AST: 76 — AB (ref 14–40)
Alkaline Phosphatase: 157 — AB (ref 25–125)
Bilirubin, Total: 0.6

## 2020-12-19 LAB — COMPREHENSIVE METABOLIC PANEL
Albumin: 3.7 (ref 3.5–5.0)
Calcium: 9 (ref 8.7–10.7)

## 2020-12-19 LAB — BASIC METABOLIC PANEL
BUN: 9 (ref 4–21)
CO2: 24 — AB (ref 13–22)
Chloride: 107 (ref 99–108)
Creatinine: 0.6 (ref 0.6–1.3)
Glucose: 131
Potassium: 4.7 (ref 3.4–5.3)
Sodium: 138 (ref 137–147)

## 2020-12-19 LAB — CBC AND DIFFERENTIAL
HCT: 33 — AB (ref 41–53)
Hemoglobin: 10.6 — AB (ref 13.5–17.5)
Neutrophils Absolute: 2.87
Platelets: 89 — AB (ref 150–399)
WBC: 4.1

## 2020-12-19 LAB — CBC: RBC: 4.15 (ref 3.87–5.11)

## 2020-12-19 NOTE — Telephone Encounter (Signed)
Per 1/27 los all appts sched and given to patient

## 2020-12-24 ENCOUNTER — Other Ambulatory Visit: Payer: Self-pay | Admitting: Hematology and Oncology

## 2020-12-24 DIAGNOSIS — R197 Diarrhea, unspecified: Secondary | ICD-10-CM

## 2020-12-27 DIAGNOSIS — C20 Malignant neoplasm of rectum: Secondary | ICD-10-CM | POA: Diagnosis not present

## 2020-12-27 DIAGNOSIS — C7800 Secondary malignant neoplasm of unspecified lung: Secondary | ICD-10-CM | POA: Diagnosis not present

## 2020-12-27 DIAGNOSIS — E1165 Type 2 diabetes mellitus with hyperglycemia: Secondary | ICD-10-CM | POA: Diagnosis not present

## 2020-12-27 DIAGNOSIS — I240 Acute coronary thrombosis not resulting in myocardial infarction: Secondary | ICD-10-CM | POA: Diagnosis not present

## 2021-01-06 ENCOUNTER — Encounter: Payer: Self-pay | Admitting: Oncology

## 2021-01-08 ENCOUNTER — Encounter: Payer: Self-pay | Admitting: Oncology

## 2021-01-08 DIAGNOSIS — K7460 Unspecified cirrhosis of liver: Secondary | ICD-10-CM | POA: Diagnosis not present

## 2021-01-08 DIAGNOSIS — C7800 Secondary malignant neoplasm of unspecified lung: Secondary | ICD-10-CM | POA: Diagnosis not present

## 2021-01-08 DIAGNOSIS — K4020 Bilateral inguinal hernia, without obstruction or gangrene, not specified as recurrent: Secondary | ICD-10-CM | POA: Diagnosis not present

## 2021-01-08 DIAGNOSIS — C218 Malignant neoplasm of overlapping sites of rectum, anus and anal canal: Secondary | ICD-10-CM | POA: Diagnosis not present

## 2021-01-08 DIAGNOSIS — K766 Portal hypertension: Secondary | ICD-10-CM | POA: Diagnosis not present

## 2021-01-08 DIAGNOSIS — J432 Centrilobular emphysema: Secondary | ICD-10-CM | POA: Diagnosis not present

## 2021-01-08 DIAGNOSIS — I2510 Atherosclerotic heart disease of native coronary artery without angina pectoris: Secondary | ICD-10-CM | POA: Diagnosis not present

## 2021-01-08 DIAGNOSIS — C189 Malignant neoplasm of colon, unspecified: Secondary | ICD-10-CM | POA: Diagnosis not present

## 2021-01-09 NOTE — Progress Notes (Signed)
Dakota Bennett  [address] [place],  [place]  [address] [phone]  Clinic Day:  01/10/2021  Referring physician: Serita Grammes, MD   This document serves as a record of services personally performed by Hosie Poisson, MD. It was created on their behalf by Curry,Lauren E, a trained medical scribe. The creation of this record is based on the scribe's personal observations and the provider's statements to them.  CHIEF COMPLAINT:  CC: Stage IIIA rectosigmoid adenocarcinoma  Current Treatment:  Palliative radiation for a total of 13 doses to be followed by chemotherapy   HISTORY OF PRESENT ILLNESS:  Dakota Bennett is a 75 y.o. 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 This was found because of a positive Cologuard test, but he did have some rectal bleeding for several months.  Colonoscopy revealed an ulcerated mass at the proximal rectum at 9 cm and extending from the proximal rectum to the rectosigmoid junction.  An endoscopic ultrasound was done and confirmed a nonobstructing medium-sized mass of the proximal rectum involving 2/3 of the circumference and measuring 3 cm long, occurring 10 cm from the anal verge.  He also had 2 rounded suspicious perirectal lymph nodes measuring 5 and 6 mm.  CT scans of the abdomen and pelvis revealed several rounded nodules at the lung bases, which were tiny and largely unchanged from prior scans, so felt to likely be benign. CEA was elevated at 28.3.  He received neoadjuvant chemoradiation with 5 fluorouracil infusion for the 1st and 5th week of radiation and completed treatment the end of June.  He tolerated this fairly well, but had severe dysuria, as well as urinary hesitancy and soft stools.  We scheduled him for CT chest, abdomen and pelvis and referred him to Dr. Amalia Hailey for consideration of surgery.  However, his CT chest revealed several  pulmonary nodules, which had enlarged.  There was generalized wall thickening in the rectum, which could be due to the radiotherapy.  He underwent PET scan, which revealed increased uptake in the pulmonary nodules felt to be consistent with metastatic disease.  There was no evidence of persistence of disease in the rectum or other areas of metastasis.  His surgical plans were canceled.  The CEA was normal at that time. We obtained KRAS testing on his tumor, which unfortunately did show a mutation.  MMR testing of the tumor was normal.  We recommended palliative chemotherapy with FOLFOX/bevacizumab.  The patient had persistent thrombocytopenia, so we reduced his chemotherapy doses by a total of 25%.  Repeat imaging after 6 cycles of FOLFOX/bevacizumab was overall stable, there was some increase in the larger pulmonary nodules, however, they appeared necrotic indicating a response to therapy.   After 12 cycles of FOLFOX/bevacizumab restaging scans were obtained that showed the dominant left lower lobe lesion had substantially decreased in size and no new or progressive pulmonary nodule or masses were seen.  Oxaliplatin was discontinued due to neuropathy in January 2019.  Repeat CT imaging after 20 cycles revealed stable to decreased pulmonary nodules with no other evidence of metastasis.    He was continued on palliative chemotherapy with occasional changes in dosing and schedule.  He has had several episodes of rectal bleeding.  Dr. Lyndel Safe performed sigmoidoscopy in March 2020 which did not reveal any evidence of disease in the rectum.  He had worsening neuropathy of the feet, so was started on gabapentin 300 mg at  bedtime and the dose was increased but later stopped.  While having a 39th cycle of infusional 5-fluorouracil, he contacted [place] to report episodes of chest pain since starting his treatment with radiation down the arms and an episode of emesis.  He was referred to the emergency department and found to have  had a non STEM in March 2020I.  His 5 FU chemotherapy pump was discontinued.  He was transferred to [facility] for further evaluation and treatment, and had 3 stents placed in early April. Per Dr. Joya Gaskins telehealth note, the patient had residual LAD disease and recommended he be treated medically unless he has refractory angina, as he would require atherectomy of a proximal LAD lesion.  Dr. Bettina Gavia recommended discontinuing the gabapentin.  He placed the patient back on magnesium oxide 400 mg daily.  He was also started on clopidogrel 75 mg daily, as well as isosorbide mononitrate 30 mg once a day, amlodipine 5 mg once a day, atorvastatin 80 mg once a day and aspirin 81 mg daily.  Chemotherapy was held due to his recent non STEMI and resumed in May 2020.  Bevacizumab was discontinued due to recurrent rectal bleeding in June 2020 and the dose of chemotherapy was reduced by 10% due to worsening thrombocytopenia.  CT imaging in July 2020 revealed a mild interval increase in size of the extensive bilateral pulmonary nodules, as well as increased wall thickening of the rectum.  He also was found to have increasing splenomegaly.  His hemoglobin had also dropped to 7.8, and his platelet count had dropped to 80,000.  We therefore stopped his treatment due to progressive disease, as well as worsening anemia and thrombocytopenia.  He had evidence of recurrent iron deficiency, so received IV iron in July with improvement in his anemia.  Since July of 2020 he has had slow steady progression of his lung metastases but has remained asymptomatic.  His CEA has steadily increased as well.  CT chest, abdomen and pelvis from October 2021 revealed mild progression of the diffuse bilateral pulmonary metastases and a new 11 mm left mediastinal lymph node.  Index nodule in the medial left lower lobe currently measures 3.4 x 2.7 cm, compared to 2.8 x 2.2 cm previously.  Otherwise, the exam was stable.  CEA from October was  457.0.  Dat presented to [facility] on November 22nd due to 5 episodes of bright red blood per rectum with dark clotting that day and was admitted.  His hemoglobin dropped down to 6 during his stay and he received 2 units of PRBCs on November 24th.  He was advised to hold Plavix.  He underwent a flexible sigmoidoscopy on November 25th revealing a single small ulcer with a prominent visble vessel in the distal rectum. For hemostasis, one hemostatic clip was successfully placed. There was no bleeding at the end of the procedure.  A fungating and ulcerated non-obstructing mass was found in the rectum. The mass was traversable, circumferential and measured five cm in length. There was mild contact oozing present. This was biopsied and surgical pathology was consistent with adenocarcinoma.  The sigmoid colon mucosa proximal to the mass was otherwise normal appearing.  Non-bleeding small internal hemorrhoids were found during retroflexion.  His hemoglobin was up to 9.3 when I saw him in December.  At that time, we discussed options of palliation and referred him to Dr. Orlene Erm for some additional radiation.  He had COVID infection is December 2021.  Weyman completed a 3250 cGy course  of radiation, a total of 13 fractions, on January 21st.   INTERVAL HISTORY:  Dakota Bennett is here for follow up and states that he doing well other than residual rectal sorenss from radiaiton.  He also has occasions of chest pain, resolved with nitroglycerin.  CT imaging from February 16th revealed numerous pulmonary masses and nodules, which are significantly increased in size, consistent with worsened pulmonary metastatic disease.  An index mass of the dependent left lower lobe measures 4.0 x 3.2 cm, previously 2.8 x 2.5 cm. Another index mass of the subpleural right upper lobe measures 3.6 x 2.9 cm, previously 2.4 x 2.4 cm.  There is also interval enlargement of a hypodense AP window lymph node now measuring 1.9 x 1.4 cm,  previously 1.8 x 1.1 cm, consistent with worsened nodal metastatic disease.  No significant interval change in the circumferential wall thickening of the distal sigmoid colon and rectum with adjacent fat stranding in the low pelvis, consistent with post treatment appearance of primary colorectal malignancy.  Hemoglobin has mildly increased from 10.6 to 11.2, platelet count has mildly increased from 89,000 to 96,000, and white count is normal.  Chemistries are remarkable for an SGOT of 76, stable, and an SGPT of 74, previously 60.  CEA is 452.0, previously 397.0 in January.  His  appetite is good, and he has lost 2 pounds since his last visit.  He denies fever, chills or other signs of infection.  He denies nausea, vomiting, bowel issues, or abdominal pain.  He denies sore throat, cough, dyspnea, or chest pain.  REVIEW OF SYSTEMS:  Review of Systems  Constitutional: Negative.  Negative for appetite change, chills, fatigue, fever and unexpected weight change.  HENT:  Negative.   Eyes: Negative.   Respiratory: Negative for chest tightness, cough, hemoptysis, shortness of breath and wheezing.   Cardiovascular: Positive for chest pain (only on occasion, resolution with nitroglycerin). Negative for leg swelling and palpitations.  Gastrointestinal: Positive for rectal pain (residual from radiation). Negative for abdominal distention, abdominal pain, blood in stool, constipation, diarrhea, nausea and vomiting.  Endocrine: Negative.   Genitourinary: Negative.  Negative for dysuria, frequency and hematuria.   Musculoskeletal: Negative.  Negative for gait problem.  Skin: Negative.   Neurological: Negative.  Negative for dizziness, extremity weakness, gait problem, headaches, light-headedness, numbness, seizures and speech difficulty.  Hematological: Negative.   Psychiatric/Behavioral: Negative.  Negative for depression and sleep disturbance. The patient is not nervous/anxious.   All other systems reviewed and  are negative.    VITALS:  Blood pressure (!) 178/81, pulse 69, temperature 97.8 F (36.6 C), temperature source Oral, resp. rate 18, height 5' 10"  (1.778 m), weight 180 lb 4.8 oz (81.8 kg), SpO2 95 %.  Wt Readings from Last 3 Encounters:  01/10/21 180 lb 4.8 oz (81.8 kg)  12/19/20 182 lb 3.2 oz (82.6 kg)  11/27/20 178 lb 6.4 oz (80.9 kg)    Body mass index is 25.87 kg/m.  Performance status (ECOG): 1 - Symptomatic but completely ambulatory  PHYSICAL EXAM:  Physical Exam Constitutional:      General: He is not in acute distress.    Appearance: Normal appearance. He is normal weight.  HENT:     Head: Normocephalic and atraumatic.  Eyes:     General: No scleral icterus.    Extraocular Movements: Extraocular movements intact.     Conjunctiva/sclera: Conjunctivae normal.     Pupils: Pupils are equal, round, and reactive to light.  Cardiovascular:     Rate and  Rhythm: Normal rate and regular rhythm.     Pulses: Normal pulses.     Heart sounds: Normal heart sounds. No murmur heard. No friction rub. No gallop.   Pulmonary:     Effort: Pulmonary effort is normal. No respiratory distress.     Breath sounds: Normal breath sounds.  Abdominal:     General: Bowel sounds are normal. There is no distension.     Palpations: Abdomen is soft. There is no mass.     Tenderness: There is no abdominal tenderness.  Musculoskeletal:        General: Normal range of motion.     Cervical back: Normal range of motion and neck supple.     Right lower leg: No edema.     Left lower leg: No edema.  Lymphadenopathy:     Cervical: No cervical adenopathy.  Skin:    General: Skin is warm and dry.  Neurological:     General: No focal deficit present.     Mental Status: He is alert and oriented to person, place, and time. Mental status is at baseline.  Psychiatric:        Mood and Affect: Mood normal.        Behavior: Behavior normal.        Thought Content: Thought content normal.        Judgment:  Judgment normal.    LABS:   CBC Latest Ref Rng & Units 12/19/2020 11/27/2020 10/23/2020  WBC - 4.1 5.6 4.3  Hemoglobin 13.5 - 17.5 10.6(A) 11.2(A) 9.5(A)  Hematocrit 41 - 53 33(A) 35(A) 29(A)  Platelets 150 - 399 89(A) 103(A) 111(A)   CMP Latest Ref Rng & Units 12/19/2020 11/27/2020 10/23/2020  Glucose 70 - 99 mg/dL - - -  BUN 4 - 21 9 12 8   Creatinine 0.6 - 1.3 0.6 0.6 0.7  Sodium 137 - 147 138 137 134(A)  Potassium 3.4 - 5.3 4.7 3.9 3.9  Chloride 99 - 108 107 - 101  CO2 13 - 22 24(A) 24(A) 23(A)  Calcium 8.7 - 10.7 9.0 9.3 8.5(A)  Total Protein 6.5 - 8.1 g/dL - - -  Total Bilirubin 0.3 - 1.2 mg/dL - - -  Alkaline Phos 25 - 125 157(A) 120 135(A)  AST 14 - 40 76(A) 29 40  ALT 10 - 40 60(A) 24 35     Lab Results  Component Value Date   CEA1 397.0 (H) 11/27/2020   /  CEA  Date Value Ref Range Status  11/27/2020 397.0 (H) 0.0 - 4.7 ng/mL Final    Comment:    (NOTE)                             Nonsmokers          <3.9                             Smokers             <5.6 [employer] Electrochemiluminescence Immunoassay (ECLIA) Values obtained with different assay methods or kits cannot be used interchangeably.  Results cannot be interpreted as absolute evidence of the presence or absence of malignant disease. Performed At: [facility] [place], [place] [id number] Rush Farmer MD NF:[id number]      STUDIES:   He underwent CT chest, abdomen and pelvis on 01/08/2021 showing: 1. Numerous bilateral pulmonary masses and nodules,  which are significantly increased in size compared to prior examination, consistent with worsened pulmonary metastatic disease. 2. Interval enlargement of a hypodense AP window lymph node, consistent with worsened nodal metastatic disease. 3. No significant interval change in circumferential wall thickening of the distal sigmoid colon and rectum with adjacent fat stranding in the low pelvis, consistent with post treatment  appearance of primary colorectal malignancy. 4. Stigmata of cirrhosis and portal hypertension, including splenomegaly. 5. Coronary artery disease. 6. Emphysema. 7. Bilateral inguinal hernias, containing a portion of the urinary bladder on the right.  Allergies:  Allergies  Allergen Reactions  . Lisinopril Hives       . Adhesive [Tape] Rash    Bandaids  . Aspirin Other (See Comments)    Burns stomach   . Neosporin [Neomycin-Bacitracin Zn-Polymyx] Rash    Current Medications: Current Outpatient Medications  Medication Sig Dispense Refill  . diphenoxylate-atropine (LOMOTIL) 2.5-0.025 MG tablet TAKE 1 TO 2 TABLETS BY MOUTH 4 TIMES DAILY AS NEEDED FOR DIARRHEA 30 tablet 1  . atorvastatin (LIPITOR) 80 MG tablet Take 1 tablet (80 mg total) by mouth daily at 6 PM. 30 tablet 0  . clopidogrel (PLAVIX) 75 MG tablet Take 1 tablet (75 mg total) by mouth daily with breakfast. 30 tablet 0  . Diphenoxylate-Atropine (LOMOTIL PO) Take by mouth as needed.    . ezetimibe (ZETIA) 10 MG tablet TAKE 1 TABLET BY MOUTH EVERY DAY 90 tablet 3  . famotidine (PEPCID) 20 MG tablet Take 1 tablet (20 mg total) by mouth as needed for heartburn or indigestion. 30 tablet 0  . ferrous sulfate 325 (65 FE) MG tablet Take 1 tablet (325 mg total) by mouth daily. 30 tablet 3  . gabapentin (NEURONTIN) 300 MG capsule Take 300 mg by mouth at bedtime.    . hydrALAZINE (APRESOLINE) 25 MG tablet TAKE 1 TABLET BY MOUTH THREE TIMES A DAY 270 tablet 1  . isosorbide mononitrate (IMDUR) 30 MG 24 hr tablet Take 1 tablet (30 mg total) by mouth daily. 30 tablet 0  . metFORMIN (GLUCOPHAGE-XR) 500 MG 24 hr tablet Take 500 mg by mouth in the morning and at bedtime.     . metoprolol succinate (TOPROL-XL) 100 MG 24 hr tablet Take 100 mg by mouth daily. Take with or immediately following a meal.    . minoxidil (LONITEN) 2.5 MG tablet Take 2 tablets (5 mg total) by mouth 2 (two) times daily. 360 tablet 3  . nitroGLYCERIN (NITROSTAT) 0.4 MG  SL tablet Place 1 tablet (0.4 mg total) under the tongue every 5 (five) minutes as needed for chest pain. 25 tablet 3  . pantoprazole (PROTONIX) 40 MG tablet TAKE 1 TABLET BY MOUTH EVERY DAY (Patient taking differently: as needed.) 90 tablet 1  . polyethylene glycol (MIRALAX / GLYCOLAX) 17 g packet Take 17 g by mouth daily. 14 each 0  . Potassium Chloride ER 20 MEQ TBCR Take 20 mEq by mouth daily.    . prochlorperazine (COMPAZINE) 10 MG tablet Take 1 tablet (10 mg total) by mouth every 6 (six) hours as needed. 60 tablet 5   No current facility-administered medications for this visit.     ASSESSMENT & PLAN:   Assessment:   1. Metastatic colorectal cancer to the lung.  He had mild progression of disease on palliative infusional 5 fluorouracil/leucovorin/bevacizumab every 3 weeks, so was switched back to infusional 5 fluorouracil/leucovorin/bevacizumab every 2 weeks.  He had minimal progression of disease from February to July 2020, so we continued the  same regimen.  Bevacizumab was discontinued in May 2020 due to rectal bleeding.  CT imaging in July 2020 revealed progressive disease both in the lungs and thickening of the rectum, so chemotherapy was discontinued and the patient was placed on observation.  He has had continued slow progression of his disease on imaging, as well as an increasing CEA.  CT imaging in October 2021 revealed progressive pulmonary metastasis and a new 11 mm left mediastinal lymph node.  The patient at that time desired to continue observation, but was willing to get back on treatment if his disease significantly worsened.  He completed palliative radiation on January 21st.  CT imaging from February has shown interval progression of disease, and so I do recommend chemotherapy at this time.  His main option would be FOLFIRI but we could reduce doses due to his age and cytopenias.    2.  Rectal cancer which has not been resected due to the metastatic disease.  He has had  intermittent bleeding which is now palliated by radiation.  3. Persistent neuropathy.  He is using gabapentin.  4. Anemia, which has mildly improved.  He continues ferrous sulfate once daily.  5. Thrombocytopenia, secondary to chemotherapy and splenomegaly, which has mildly improved.  6. Splenomegaly and liver cirrhosis on CT imaging, which is likely contributing to his thrombocytopenia.  I cannot palpate the spleen or liver on physical exam.    7. History of non-ST elevation myocardial infarction secondary to coronary disease.  He had 3 coronary artery stents placed in April 2020.   8.  Hospitalization due to significant GI bleed in November 2021.  His hemoglobin dropped down to 6 at that time, and he received 2 units of PRBCs.  Sigmoidoscopy confirmed a 5 cm fungating and ulcerated non-obstructing mass in the rectum and biopsy was consistent with adenocarcinoma.  Therefore, he has received palliative radiation.  9.  Mildly abnormal liver function tests, fairly stable.  We will continue to monitor this.  Plan: He completed palliative radiation on January 21st and continues to have rectal pain/soreness.  Recent CT imaging does show interval progression of disease and his CEA continues to increase.  We discussed pursing chemotherapy at this time as he feels fairly well, most likely with FOLFIRI at reduced dosing.  If we pursued this therapy first and he did not tolerate, other alternatives could be considered such as oral lonsurf.  He could also stop treatment at any time if the toxicities were too great.  Continued observation was also reviewed if he wished to focus on quality of life.  We will give him some time to make a decision, and he will contact us. The patient understands the plans discussed today and is in agreement with them.  He knows to contact our office if he develops concerns prior to his next appointment.   I provided 25 minutes of face-to-face time during this this encounter and  > 50% was spent counseling as documented under my assessment and plan.    Derwood Kaplan, MD [facility] AT [facility] [address] [place] [address] Dept: [phone] Dept Fax: [phone]   I, Rita Ohara, am acting as scribe for Derwood Kaplan, MD  I have reviewed this report as typed by the medical scribe, and it is complete and accurate.

## 2021-01-10 ENCOUNTER — Inpatient Hospital Stay: Payer: Medicare Other | Attending: Oncology | Admitting: Oncology

## 2021-01-10 ENCOUNTER — Encounter: Payer: Self-pay | Admitting: Oncology

## 2021-01-10 ENCOUNTER — Other Ambulatory Visit: Payer: Self-pay

## 2021-01-10 VITALS — BP 178/81 | HR 69 | Temp 97.8°F | Resp 18 | Ht 70.0 in | Wt 180.3 lb

## 2021-01-10 DIAGNOSIS — C7801 Secondary malignant neoplasm of right lung: Secondary | ICD-10-CM

## 2021-01-10 DIAGNOSIS — C20 Malignant neoplasm of rectum: Secondary | ICD-10-CM

## 2021-01-10 DIAGNOSIS — C7802 Secondary malignant neoplasm of left lung: Secondary | ICD-10-CM

## 2021-01-15 ENCOUNTER — Other Ambulatory Visit: Payer: Self-pay | Admitting: Cardiology

## 2021-01-15 NOTE — Telephone Encounter (Signed)
Refill sent to pharmacy.   

## 2021-01-29 ENCOUNTER — Other Ambulatory Visit: Payer: Self-pay | Admitting: Hematology and Oncology

## 2021-01-29 ENCOUNTER — Telehealth: Payer: Self-pay

## 2021-01-29 DIAGNOSIS — E876 Hypokalemia: Secondary | ICD-10-CM

## 2021-01-29 DIAGNOSIS — C20 Malignant neoplasm of rectum: Secondary | ICD-10-CM

## 2021-01-29 NOTE — Telephone Encounter (Addendum)
I spoke with Santiago Glad. She confirms that he is taking potassium QID. I gave them appt for next Tuesday w/labs & F/U.    Per Vida Roller, PA : How often is he taking potassium? I don't want his level to get too high can cause problems with heart. He doesn't have an appointment because he was supposed to call with a decision about treatment. Can we bring him in next week for labs and f/u? Thank you!

## 2021-02-03 NOTE — Progress Notes (Signed)
[facility]  [address] [place],  [place]  [address] [phone]  Clinic Day:  02/04/2021  Referring physician: Serita Grammes, MD   This document serves as a record of services personally performed by Hosie Poisson, MD. It was created on their behalf by Curry,Lauren E, a trained medical scribe. The creation of this record is based on the scribe's personal observations and the provider's statements to them.  CHIEF COMPLAINT:  CC: Stage IIIA rectosigmoid adenocarcinoma  Current Treatment:  Observation   HISTORY OF PRESENT ILLNESS:  Aditya Nastasi is a 75 y.o. 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 This was found because of a positive Cologuard test, but he did have some rectal bleeding for several months.  Colonoscopy revealed an ulcerated mass at the proximal rectum at 9 cm and extending from the proximal rectum to the rectosigmoid junction.  An endoscopic ultrasound was done and confirmed a nonobstructing medium-sized mass of the proximal rectum involving 2/3 of the circumference and measuring 3 cm long, occurring 10 cm from the anal verge.  He also had 2 rounded suspicious perirectal lymph nodes measuring 5 and 6 mm.  CT scans of the abdomen and pelvis revealed several rounded nodules at the lung bases, which were tiny and largely unchanged from prior scans, so felt to likely be benign. CEA was elevated at 28.3.  He received neoadjuvant chemoradiation with 5 fluorouracil infusion for the 1st and 5th week of radiation and completed treatment the end of June.  He tolerated this fairly well, but had severe dysuria, as well as urinary hesitancy and soft stools.  We scheduled him for CT chest, abdomen and pelvis and referred him to Dr. Amalia Hailey for consideration of surgery.  However, his CT chest revealed several pulmonary nodules, which had enlarged.  There was generalized wall  thickening in the rectum, which could be due to the radiotherapy.  He underwent PET scan, which revealed increased uptake in the pulmonary nodules felt to be consistent with metastatic disease.  There was no evidence of persistence of disease in the rectum or other areas of metastasis.  His surgical plans were canceled.  The CEA was normal at that time. We obtained KRAS testing on his tumor, which unfortunately did show a mutation.  MMR testing of the tumor was normal.  We recommended palliative chemotherapy with FOLFOX/bevacizumab.  The patient had persistent thrombocytopenia, so we reduced his chemotherapy doses by a total of 25%.  Repeat imaging after 6 cycles of FOLFOX/bevacizumab was overall stable, there was some increase in the larger pulmonary nodules, however, they appeared necrotic indicating a response to therapy.   After 12 cycles of FOLFOX/bevacizumab restaging scans were obtained that showed the dominant left lower lobe lesion had substantially decreased in size and no new or progressive pulmonary nodule or masses were seen.  Oxaliplatin was discontinued due to neuropathy in January 2019.  Repeat CT imaging after 20 cycles revealed stable to decreased pulmonary nodules with no other evidence of metastasis.    He was continued on palliative chemotherapy with occasional changes in dosing and schedule.  He has had several episodes of rectal bleeding.  Dr. Lyndel Safe performed sigmoidoscopy in March 2020 which did not reveal any evidence of disease in the rectum.  He had worsening neuropathy of the feet, so was started on gabapentin 300 mg at bedtime and the dose was increased but later stopped.  While having  a 39th cycle of infusional 5-fluorouracil, he contacted [place] to report episodes of chest pain since starting his treatment with radiation down the arms and an episode of emesis.  He was referred to the emergency department and found to have had a non STEM in March 2020I.  His 5 FU chemotherapy pump was  discontinued.  He was transferred to [facility] for further evaluation and treatment, and had 3 stents placed in early April. Per Dr. Joya Gaskins telehealth note, the patient had residual LAD disease and recommended he be treated medically unless he has refractory angina, as he would require atherectomy of a proximal LAD lesion.  Dr. Bettina Gavia recommended discontinuing the gabapentin.  He placed the patient back on magnesium oxide 400 mg daily.  He was also started on clopidogrel 75 mg daily, as well as isosorbide mononitrate 30 mg once a day, amlodipine 5 mg once a day, atorvastatin 80 mg once a day and aspirin 81 mg daily.  Chemotherapy was held due to his recent non STEMI and resumed in May 2020.  Bevacizumab was discontinued due to recurrent rectal bleeding in June 2020 and the dose of chemotherapy was reduced by 10% due to worsening thrombocytopenia.  CT imaging in July 2020 revealed a mild interval increase in size of the extensive bilateral pulmonary nodules, as well as increased wall thickening of the rectum.  He also was found to have increasing splenomegaly.  His hemoglobin had also dropped to 7.8, and his platelet count had dropped to 80,000.  We therefore stopped his treatment due to progressive disease, as well as worsening anemia and thrombocytopenia.  He had evidence of recurrent iron deficiency, so received IV iron in July with improvement in his anemia.  Since July of 2020 he has had slow steady progression of his lung metastases but has remained asymptomatic.  His CEA has steadily increased as well.  CT chest, abdomen and pelvis from October 2021 revealed mild progression of the diffuse bilateral pulmonary metastases and a new 11 mm left mediastinal lymph node.  Index nodule in the medial left lower lobe currently measures 3.4 x 2.7 cm, compared to 2.8 x 2.2 cm previously.  Otherwise, the exam was stable.  CEA from October was 457.0.  Karson presented to [facility] on November 22nd due to 5  episodes of bright red blood per rectum with dark clotting that day and was admitted.  His hemoglobin dropped down to 6 during his stay and he received 2 units of PRBCs on November 24th.  He was advised to hold Plavix.  He underwent a flexible sigmoidoscopy on November 25th revealing a single small ulcer with a prominent visble vessel in the distal rectum. For hemostasis, one hemostatic clip was successfully placed. There was no bleeding at the end of the procedure.  A fungating and ulcerated non-obstructing mass was found in the rectum. The mass was traversable, circumferential and measured five cm in length. There was mild contact oozing present. This was biopsied and surgical pathology was consistent with adenocarcinoma.  The sigmoid colon mucosa proximal to the mass was otherwise normal appearing.  Non-bleeding small internal hemorrhoids were found during retroflexion.  His hemoglobin was up to 9.3 when I saw him in December.  At that time, we discussed options of palliation and referred him to Dr. Orlene Erm for some additional radiation.  He had COVID infection is December 2021.  Kylle completed a 3250 cGy course of radiation, a total of 13 fractions, on January 21st 2022.  CT imaging from February revealed numerous pulmonary masses and nodules, which are significantly increased in size, consistent with worsened pulmonary metastatic disease.  An index mass of the dependent left lower lobe measures 4.0 x 3.2 cm, previously 2.8 x 2.5 cm. Another index mass of the subpleural right upper lobe measures 3.6 x 2.9 cm, previously 2.4 x 2.4 cm.  There is also interval enlargement of a hypodense AP window lymph node now measuring 1.9 x 1.4 cm, previously 1.8 x 1.1 cm, consistent with worsened nodal metastatic disease.  No significant interval change in the circumferential wall thickening of the distal sigmoid colon and rectum with adjacent fat stranding in the low pelvis, consistent with post treatment appearance of  primary colorectal malignancy.   INTERVAL HISTORY:  He is here for routine follow up and states that he continues to do well.  He still has residual soreness of the rectum from radiation, but denies recurrent rectal bleeding.  He has had some occasional diarrhea.  He denies cough or shortness of breath.  As he feels well, he does not wish to start chemotherapy at this time.  His hemoglobin has mildly increase to 11.6, his platelet count has mildly decreased to 92,000, and his white count is normal.  Chemistries are remarkable for worsening liver function tests with an SGOT of 98 and an SGPT of 191.  He has been averaging 2 Tylenol daily, and I advised that he stick with ibuprofen for now.  His  appetite is good, and he has gained 5 pounds since his last visit.  He denies fever, chills or other signs of infection.  He denies nausea, vomiting, bowel issues, or abdominal pain.  He denies sore throat, cough, dyspnea, or chest pain.  REVIEW OF SYSTEMS:  Review of Systems  Constitutional: Negative.  Negative for appetite change, chills, fatigue, fever and unexpected weight change.  HENT:  Negative.   Eyes: Negative.   Respiratory: Negative.  Negative for chest tightness, cough, hemoptysis, shortness of breath and wheezing.   Cardiovascular: Negative.  Negative for chest pain, leg swelling and palpitations.  Gastrointestinal: Positive for diarrhea (intermittent). Negative for abdominal distention, abdominal pain, blood in stool, constipation, nausea and vomiting.       Residual rectal soreness, post radiation therapy  Endocrine: Negative.   Genitourinary: Negative.  Negative for difficulty urinating, dysuria, frequency and hematuria.   Musculoskeletal: Negative.  Negative for arthralgias, back pain, flank pain, gait problem and myalgias.  Skin: Negative.   Neurological: Negative.  Negative for dizziness, extremity weakness, gait problem, headaches, light-headedness, numbness, seizures and speech  difficulty.  Hematological: Negative.   Psychiatric/Behavioral: Negative.  Negative for depression and sleep disturbance. The patient is not nervous/anxious.   All other systems reviewed and are negative.    VITALS:  Blood pressure (!) 167/77, pulse 72, temperature 97.7 F (36.5 C), temperature source Oral, resp. rate 18, height 5' 10"  (1.778 m), weight 185 lb 6.4 oz (84.1 kg), SpO2 95 %.  Wt Readings from Last 3 Encounters:  02/04/21 185 lb 6.4 oz (84.1 kg)  01/10/21 180 lb 4.8 oz (81.8 kg)  12/19/20 182 lb 3.2 oz (82.6 kg)    Body mass index is 26.6 kg/m.  Performance status (ECOG): 1 - Symptomatic but completely ambulatory  PHYSICAL EXAM:  Physical Exam Constitutional:      General: He is not in acute distress.    Appearance: Normal appearance. He is normal weight.  HENT:     Head: Normocephalic and atraumatic.  Eyes:  General: No scleral icterus.    Extraocular Movements: Extraocular movements intact.     Conjunctiva/sclera: Conjunctivae normal.     Pupils: Pupils are equal, round, and reactive to light.  Cardiovascular:     Rate and Rhythm: Normal rate and regular rhythm.     Pulses: Normal pulses.     Heart sounds: Normal heart sounds. No murmur heard. No friction rub. No gallop.   Pulmonary:     Effort: Pulmonary effort is normal. No respiratory distress.     Breath sounds: Normal breath sounds.  Abdominal:     General: Bowel sounds are normal. There is no distension.     Palpations: Abdomen is soft. There is no hepatomegaly, splenomegaly or mass.     Tenderness: There is no abdominal tenderness.  Musculoskeletal:        General: Normal range of motion.     Cervical back: Normal range of motion and neck supple.     Right lower leg: No edema.     Left lower leg: No edema.  Lymphadenopathy:     Cervical: No cervical adenopathy.  Skin:    General: Skin is warm and dry.  Neurological:     General: No focal deficit present.     Mental Status: He is alert and  oriented to person, place, and time. Mental status is at baseline.  Psychiatric:        Mood and Affect: Mood normal.        Behavior: Behavior normal.        Thought Content: Thought content normal.        Judgment: Judgment normal.    LABS:   CBC Latest Ref Rng & Units 12/19/2020 11/27/2020 10/23/2020  WBC - 4.1 5.6 4.3  Hemoglobin 13.5 - 17.5 10.6(A) 11.2(A) 9.5(A)  Hematocrit 41 - 53 33(A) 35(A) 29(A)  Platelets 150 - 399 89(A) 103(A) 111(A)   CMP Latest Ref Rng & Units 12/19/2020 11/27/2020 10/23/2020  Glucose 70 - 99 mg/dL - - -  BUN 4 - 21 9 12 8   Creatinine 0.6 - 1.3 0.6 0.6 0.7  Sodium 137 - 147 138 137 134(A)  Potassium 3.4 - 5.3 4.7 3.9 3.9  Chloride 99 - 108 107 - 101  CO2 13 - 22 24(A) 24(A) 23(A)  Calcium 8.7 - 10.7 9.0 9.3 8.5(A)  Total Protein 6.5 - 8.1 g/dL - - -  Total Bilirubin 0.3 - 1.2 mg/dL - - -  Alkaline Phos 25 - 125 157(A) 120 135(A)  AST 14 - 40 76(A) 29 40  ALT 10 - 40 60(A) 24 35     Lab Results  Component Value Date   CEA1 397.0 (H) 11/27/2020   /  CEA  Date Value Ref Range Status  11/27/2020 397.0 (H) 0.0 - 4.7 ng/mL Final    Comment:    (NOTE)                             Nonsmokers          <3.9                             Smokers             <5.6 [employer] Electrochemiluminescence Immunoassay (ECLIA) Values obtained with different assay methods or kits cannot be used interchangeably.  Results cannot be interpreted as absolute evidence of the presence or absence  of malignant disease. Performed At: [facility] [place], [place] [id number] Rush Farmer MD GO:[id number]      STUDIES:   No current studies  Allergies:  Allergies  Allergen Reactions  . Lisinopril Hives       . Adhesive [Tape] Rash    Bandaids  . Aspirin Other (See Comments)    Burns stomach   . Neosporin [Neomycin-Bacitracin Zn-Polymyx] Rash    Current Medications: Current Outpatient Medications  Medication Sig Dispense  Refill  . atorvastatin (LIPITOR) 80 MG tablet Take 1 tablet (80 mg total) by mouth daily at 6 PM. 30 tablet 0  . clopidogrel (PLAVIX) 75 MG tablet Take 1 tablet (75 mg total) by mouth daily with breakfast. 30 tablet 0  . Diphenoxylate-Atropine (LOMOTIL PO) Take by mouth as needed.    . diphenoxylate-atropine (LOMOTIL) 2.5-0.025 MG tablet TAKE 1 TO 2 TABLETS BY MOUTH 4 TIMES DAILY AS NEEDED FOR DIARRHEA 30 tablet 1  . ezetimibe (ZETIA) 10 MG tablet TAKE 1 TABLET BY MOUTH EVERY DAY 90 tablet 3  . famotidine (PEPCID) 20 MG tablet Take 1 tablet (20 mg total) by mouth as needed for heartburn or indigestion. 30 tablet 0  . ferrous sulfate 325 (65 FE) MG tablet Take 1 tablet (325 mg total) by mouth daily. 30 tablet 3  . gabapentin (NEURONTIN) 300 MG capsule Take 300 mg by mouth at bedtime.    . hydrALAZINE (APRESOLINE) 25 MG tablet TAKE 1 TABLET BY MOUTH THREE TIMES A DAY 270 tablet 1  . isosorbide mononitrate (IMDUR) 30 MG 24 hr tablet Take 1 tablet (30 mg total) by mouth daily. 30 tablet 0  . metFORMIN (GLUCOPHAGE-XR) 500 MG 24 hr tablet Take 500 mg by mouth in the morning and at bedtime.     . metoprolol succinate (TOPROL-XL) 100 MG 24 hr tablet Take 100 mg by mouth daily. Take with or immediately following a meal.    . minoxidil (LONITEN) 2.5 MG tablet Take 2 tablets (5 mg total) by mouth 2 (two) times daily. 360 tablet 3  . nitroGLYCERIN (NITROSTAT) 0.4 MG SL tablet PLACE 1 TABLET (0.4 MG TOTAL) UNDER THE TONGUE EVERY 5 (FIVE) MINUTES AS NEEDED FOR CHEST PAIN. 75 tablet 1  . pantoprazole (PROTONIX) 40 MG tablet TAKE 1 TABLET BY MOUTH EVERY DAY (Patient taking differently: as needed.) 90 tablet 1  . polyethylene glycol (MIRALAX / GLYCOLAX) 17 g packet Take 17 g by mouth daily. 14 each 0  . Potassium Chloride ER 20 MEQ TBCR TAKE 1 TABLET BY MOUTH 4 TIMES A DAY 360 tablet 3  . prochlorperazine (COMPAZINE) 10 MG tablet Take 1 tablet (10 mg total) by mouth every 6 (six) hours as needed. 60 tablet 5   No  current facility-administered medications for this visit.     ASSESSMENT & PLAN:   Assessment:   1. Metastatic colorectal cancer to the lung.  He had mild progression of disease on palliative infusional 5 fluorouracil/leucovorin/bevacizumab every 3 weeks, so was switched back to infusional 5 fluorouracil/leucovorin/bevacizumab every 2 weeks.  He had minimal progression of disease from February to July 2020, so we continued the same regimen.  Bevacizumab was discontinued in May 2020 due to rectal bleeding.  CT imaging in July 2020 revealed progressive disease both in the lungs and thickening of the rectum, so chemotherapy was discontinued and the patient was placed on observation.  He has had continued slow progression of his disease on imaging, as well as an increasing CEA.  CT imaging in October 2021 revealed progressive pulmonary metastasis and a new 11 mm left mediastinal lymph node.  The patient at that time desired to continue observation, but was willing to get back on treatment if his disease significantly worsened.  He completed palliative radiation on January 21st.  CT imaging from February has shown interval progression of disease, and so I did ecommend chemotherapy.  His main option would be FOLFIRI but we could reduce doses due to his age and cytopenias.  He wishes to hold off on therapy for now as he is physically doing well.  2.  Rectal cancer which has not been resected due to the metastatic disease.  He had a hospitalization due to significant GI bleed in November 2021.  His hemoglobin dropped down to 6 at that time, and he received 2 units of PRBCs.  Sigmoidoscopy confirmed a 5 cm fungating and ulcerated non-obstructing mass in the rectum and biopsy was consistent with adenocarcinoma.  Therefore, he has received palliative radiation.   3. Persistent neuropathy.  He is using gabapentin.  4. Anemia, which has mildly improved.  He continues ferrous sulfate once daily.  5.  Thrombocytopenia, secondary to chemotherapy and splenomegaly, which is fairly stable.  6. Splenomegaly and liver cirrhosis on CT imaging, which is likely contributing to his thrombocytopenia.  I cannot palpate the spleen or liver on physical exam.    7. History of non-ST elevation myocardial infarction secondary to coronary disease.  He had 3 coronary artery stents placed in April 2020.   8.  Worsening liver function tests.  I will add a hepatitis panel and CMV to his labs today.  We will continue to monitor this.  Hopefully it does not represent metastatic disease as the liver feels normal and appeared normal on February's scan.  His last CT was read as liver cirrhosis and so it could be related to this, which would explain his splenomegaly.  It would also make it more difficult for him to tolerate chemotherapy.  Plan: As he is doing well physically, he has decided to hold off on chemotherapy for now and wishes to continue with observation.  With his worsening liver function tests, we will obtain a hepatitis panel and CMV today and I will call him with the results.  He knows to discontinue Tylenol for now, but may continue ibuprofen.  We will see him back in 6 weeks with CBC, CMP and CEA for repeat evaluation.  We will plan on repeat CT imaging in June to reassess his disease, unless his liver function continues to worsen.  We would then obtain imaging in May, and include CT of abdomen and pelvis. The patient understands the plans discussed today and is in agreement with them.  He knows to contact our office if he develops concerns prior to his next appointment.   I provided 25 minutes of face-to-face time during this this encounter and > 50% was spent counseling as documented under my assessment and plan.    Derwood Kaplan, MD [facility] AT [facility] [address] [place] [address] Dept: [phone] Dept Fax: [phone]   I,  Rita Ohara, am acting as scribe for Derwood Kaplan, MD  I have reviewed this report as typed by the medical scribe, and it is complete and accurate.

## 2021-02-04 ENCOUNTER — Encounter: Payer: Self-pay | Admitting: Oncology

## 2021-02-04 ENCOUNTER — Telehealth: Payer: Self-pay | Admitting: Oncology

## 2021-02-04 ENCOUNTER — Other Ambulatory Visit: Payer: Self-pay

## 2021-02-04 ENCOUNTER — Inpatient Hospital Stay: Payer: Medicare Other

## 2021-02-04 ENCOUNTER — Other Ambulatory Visit: Payer: Self-pay | Admitting: Hematology and Oncology

## 2021-02-04 ENCOUNTER — Other Ambulatory Visit: Payer: Self-pay | Admitting: Oncology

## 2021-02-04 ENCOUNTER — Inpatient Hospital Stay: Payer: Medicare Other | Attending: Oncology | Admitting: Oncology

## 2021-02-04 VITALS — BP 167/77 | HR 72 | Temp 97.7°F | Resp 18 | Ht 70.0 in | Wt 185.4 lb

## 2021-02-04 DIAGNOSIS — C7801 Secondary malignant neoplasm of right lung: Secondary | ICD-10-CM | POA: Insufficient documentation

## 2021-02-04 DIAGNOSIS — D6959 Other secondary thrombocytopenia: Secondary | ICD-10-CM | POA: Diagnosis not present

## 2021-02-04 DIAGNOSIS — C771 Secondary and unspecified malignant neoplasm of intrathoracic lymph nodes: Secondary | ICD-10-CM | POA: Diagnosis not present

## 2021-02-04 DIAGNOSIS — Z923 Personal history of irradiation: Secondary | ICD-10-CM | POA: Insufficient documentation

## 2021-02-04 DIAGNOSIS — K7460 Unspecified cirrhosis of liver: Secondary | ICD-10-CM | POA: Insufficient documentation

## 2021-02-04 DIAGNOSIS — R7401 Elevation of levels of liver transaminase levels: Secondary | ICD-10-CM

## 2021-02-04 DIAGNOSIS — C19 Malignant neoplasm of rectosigmoid junction: Secondary | ICD-10-CM | POA: Insufficient documentation

## 2021-02-04 DIAGNOSIS — C20 Malignant neoplasm of rectum: Secondary | ICD-10-CM | POA: Diagnosis not present

## 2021-02-04 DIAGNOSIS — R161 Splenomegaly, not elsewhere classified: Secondary | ICD-10-CM | POA: Diagnosis not present

## 2021-02-04 DIAGNOSIS — C7800 Secondary malignant neoplasm of unspecified lung: Secondary | ICD-10-CM | POA: Diagnosis not present

## 2021-02-04 DIAGNOSIS — C7802 Secondary malignant neoplasm of left lung: Secondary | ICD-10-CM | POA: Insufficient documentation

## 2021-02-04 LAB — COMPREHENSIVE METABOLIC PANEL
Albumin: 4 (ref 3.5–5.0)
Calcium: 8.7 (ref 8.7–10.7)

## 2021-02-04 LAB — CBC
MCV: 83 (ref 80–94)
RBC: 4.45 (ref 3.87–5.11)

## 2021-02-04 LAB — BASIC METABOLIC PANEL
BUN: 9 (ref 4–21)
CO2: 21 (ref 13–22)
Chloride: 106 (ref 99–108)
Creatinine: 0.7 (ref 0.6–1.3)
Glucose: 136
Potassium: 4.4 (ref 3.4–5.3)
Sodium: 135 — AB (ref 137–147)

## 2021-02-04 LAB — HEPATIC FUNCTION PANEL
ALT: 191 — AB (ref 10–40)
AST: 98 — AB (ref 14–40)
Alkaline Phosphatase: 161 — AB (ref 25–125)
Bilirubin, Total: 0.8

## 2021-02-04 LAB — HEPATITIS PANEL, ACUTE
HCV Ab: NONREACTIVE
Hep A IgM: NONREACTIVE
Hep B C IgM: NONREACTIVE
Hepatitis B Surface Ag: NONREACTIVE

## 2021-02-04 LAB — CBC AND DIFFERENTIAL
HCT: 37 — AB (ref 41–53)
Hemoglobin: 11.6 — AB (ref 13.5–17.5)
Neutrophils Absolute: 4.1
Platelets: 92 — AB (ref 150–399)
WBC: 5.6

## 2021-02-04 NOTE — Telephone Encounter (Signed)
Per 3/15 LOS, patient scheduled for April Appt's.  Gave patient Appt Summary

## 2021-02-05 ENCOUNTER — Other Ambulatory Visit: Payer: Self-pay | Admitting: Cardiology

## 2021-02-05 LAB — CEA: CEA: 501 ng/mL — ABNORMAL HIGH (ref 0.0–4.7)

## 2021-02-05 NOTE — Telephone Encounter (Signed)
Pharmacy notified Nitroglycerin filled on 01/14/2021

## 2021-02-07 ENCOUNTER — Telehealth: Payer: Self-pay

## 2021-02-07 NOTE — Telephone Encounter (Signed)
Patient notified

## 2021-02-07 NOTE — Telephone Encounter (Signed)
-----   Message from Derwood Kaplan, MD sent at 02/06/2021  7:49 PM EDT ----- Regarding: call pt Tell him CEA is up to 501.  Hepatitis tests are negative.

## 2021-03-18 ENCOUNTER — Other Ambulatory Visit: Payer: Self-pay

## 2021-03-18 ENCOUNTER — Inpatient Hospital Stay: Payer: Medicare Other | Attending: Oncology

## 2021-03-18 ENCOUNTER — Telehealth: Payer: Self-pay | Admitting: Hematology and Oncology

## 2021-03-18 ENCOUNTER — Encounter: Payer: Self-pay | Admitting: Hematology and Oncology

## 2021-03-18 ENCOUNTER — Inpatient Hospital Stay (INDEPENDENT_AMBULATORY_CARE_PROVIDER_SITE_OTHER): Payer: Medicare Other | Admitting: Hematology and Oncology

## 2021-03-18 VITALS — BP 173/74 | HR 76 | Temp 97.8°F | Resp 18 | Ht 70.0 in | Wt 185.7 lb

## 2021-03-18 DIAGNOSIS — C19 Malignant neoplasm of rectosigmoid junction: Secondary | ICD-10-CM | POA: Insufficient documentation

## 2021-03-18 DIAGNOSIS — C7801 Secondary malignant neoplasm of right lung: Secondary | ICD-10-CM | POA: Diagnosis not present

## 2021-03-18 DIAGNOSIS — C7802 Secondary malignant neoplasm of left lung: Secondary | ICD-10-CM | POA: Diagnosis not present

## 2021-03-18 DIAGNOSIS — D649 Anemia, unspecified: Secondary | ICD-10-CM | POA: Diagnosis not present

## 2021-03-18 DIAGNOSIS — C771 Secondary and unspecified malignant neoplasm of intrathoracic lymph nodes: Secondary | ICD-10-CM | POA: Diagnosis not present

## 2021-03-18 DIAGNOSIS — C20 Malignant neoplasm of rectum: Secondary | ICD-10-CM | POA: Diagnosis not present

## 2021-03-18 DIAGNOSIS — C7800 Secondary malignant neoplasm of unspecified lung: Secondary | ICD-10-CM | POA: Diagnosis not present

## 2021-03-18 LAB — BASIC METABOLIC PANEL
BUN: 9 (ref 4–21)
CO2: 22 (ref 13–22)
Chloride: 106 (ref 99–108)
Creatinine: 0.6 (ref 0.6–1.3)
Glucose: 182
Potassium: 4.2 (ref 3.4–5.3)
Sodium: 136 — AB (ref 137–147)

## 2021-03-18 LAB — CBC AND DIFFERENTIAL
HCT: 37 — AB (ref 41–53)
Hemoglobin: 11.8 — AB (ref 13.5–17.5)
Neutrophils Absolute: 3.55
Platelets: 87 — AB (ref 150–399)
WBC: 4.8

## 2021-03-18 LAB — HEPATIC FUNCTION PANEL
ALT: 27 (ref 10–40)
AST: 31 (ref 14–40)
Alkaline Phosphatase: 126 — AB (ref 25–125)
Bilirubin, Total: 0.8

## 2021-03-18 LAB — COMPREHENSIVE METABOLIC PANEL
Albumin: 4 (ref 3.5–5.0)
Calcium: 8.5 — AB (ref 8.7–10.7)

## 2021-03-18 LAB — CBC: RBC: 4.36 (ref 3.87–5.11)

## 2021-03-18 NOTE — Telephone Encounter (Signed)
Per 4/26 los next appt scheduled and given to patient

## 2021-03-18 NOTE — Progress Notes (Signed)
[facility]  [address] [place],  [place]  [address] [phone]  Clinic Day:  03/18/2021  Referring physician: Serita Grammes, MD    CHIEF COMPLAINT:  CC: A 75 year old male with history of stage IIIA rectosigmoid adenocarcinoma here for 6 week evaluation.  Current Treatment:  Observation   HISTORY OF PRESENT ILLNESS:  Dakota Bennett is a 75 y.o. 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 This was found because of a positive Cologuard test, but he did have some rectal bleeding for several months.  Colonoscopy revealed an ulcerated mass at the proximal rectum at 9 cm and extending from the proximal rectum to the rectosigmoid junction.  An endoscopic ultrasound was done and confirmed a nonobstructing medium-sized mass of the proximal rectum involving 2/3 of the circumference and measuring 3 cm long, occurring 10 cm from the anal verge.  He also had 2 rounded suspicious perirectal lymph nodes measuring 5 and 6 mm.  CT scans of the abdomen and pelvis revealed several rounded nodules at the lung bases, which were tiny and largely unchanged from prior scans, so felt to likely be benign. CEA was elevated at 28.3.  He received neoadjuvant chemoradiation with 5 fluorouracil infusion for the 1st and 5th week of radiation and completed treatment the end of June.  He tolerated this fairly well, but had severe dysuria, as well as urinary hesitancy and soft stools.  We scheduled him for CT chest, abdomen and pelvis and referred him to Dr. Amalia Hailey for consideration of surgery.  However, his CT chest revealed several pulmonary nodules, which had enlarged.  There was generalized wall thickening in the rectum, which could be due to the radiotherapy.  He underwent PET scan, which revealed increased uptake in the pulmonary nodules felt to be consistent with metastatic disease.  There was no evidence of  persistence of disease in the rectum or other areas of metastasis.  His surgical plans were canceled.  The CEA was normal at that time. We obtained KRAS testing on his tumor, which unfortunately did show a mutation.  MMR testing of the tumor was normal.  We recommended palliative chemotherapy with FOLFOX/bevacizumab.  The patient had persistent thrombocytopenia, so we reduced his chemotherapy doses by a total of 25%.  Repeat imaging after 6 cycles of FOLFOX/bevacizumab was overall stable, there was some increase in the larger pulmonary nodules, however, they appeared necrotic indicating a response to therapy.   After 12 cycles of FOLFOX/bevacizumab restaging scans were obtained that showed the dominant left lower lobe lesion had substantially decreased in size and no new or progressive pulmonary nodule or masses were seen.  Oxaliplatin was discontinued due to neuropathy in January 2019.  Repeat CT imaging after 20 cycles revealed stable to decreased pulmonary nodules with no other evidence of metastasis.    He was continued on palliative chemotherapy with occasional changes in dosing and schedule.  He has had several episodes of rectal bleeding.  Dr. Lyndel Safe performed sigmoidoscopy in March 2020 which did not reveal any evidence of disease in the rectum.  He had worsening neuropathy of the feet, so was started on gabapentin 300 mg at bedtime and the dose was increased but later stopped.  While having a 39th cycle of infusional 5-fluorouracil, he contacted [place] to report episodes of chest pain since starting his treatment with radiation down the arms and an episode of emesis.  He was referred  to the emergency department and found to have had a non STEM in March 2020I.  His 5 FU chemotherapy pump was discontinued.  He was transferred to [facility] for further evaluation and treatment, and had 3 stents placed in early April. Per Dr. Joya Gaskins telehealth note, the patient had residual LAD disease and recommended he be  treated medically unless he has refractory angina, as he would require atherectomy of a proximal LAD lesion.  Dr. Bettina Gavia recommended discontinuing the gabapentin.  He placed the patient back on magnesium oxide 400 mg daily.  He was also started on clopidogrel 75 mg daily, as well as isosorbide mononitrate 30 mg once a day, amlodipine 5 mg once a day, atorvastatin 80 mg once a day and aspirin 81 mg daily.  Chemotherapy was held due to his recent non STEMI and resumed in May 2020.  Bevacizumab was discontinued due to recurrent rectal bleeding in June 2020 and the dose of chemotherapy was reduced by 10% due to worsening thrombocytopenia.  CT imaging in July 2020 revealed a mild interval increase in size of the extensive bilateral pulmonary nodules, as well as increased wall thickening of the rectum.  He also was found to have increasing splenomegaly.  His hemoglobin had also dropped to 7.8, and his platelet count had dropped to 80,000.  We therefore stopped his treatment due to progressive disease, as well as worsening anemia and thrombocytopenia.  He had evidence of recurrent iron deficiency, so received IV iron in July with improvement in his anemia.  Since July of 2020 he has had slow steady progression of his lung metastases but has remained asymptomatic.  His CEA has steadily increased as well.  CT chest, abdomen and pelvis from October 2021 revealed mild progression of the diffuse bilateral pulmonary metastases and a new 11 mm left mediastinal lymph node.  Index nodule in the medial left lower lobe currently measures 3.4 x 2.7 cm, compared to 2.8 x 2.2 cm previously.  Otherwise, the exam was stable.  CEA from October was 457.0.  Dakota Bennett presented to [facility] on November 22nd due to 5 episodes of bright red blood per rectum with dark clotting that day and was admitted.  His hemoglobin dropped down to 6 during his stay and he received 2 units of PRBCs on November 24th.  He was advised to hold Plavix.  He  underwent a flexible sigmoidoscopy on November 25th revealing a single small ulcer with a prominent visble vessel in the distal rectum. For hemostasis, one hemostatic clip was successfully placed. There was no bleeding at the end of the procedure.  A fungating and ulcerated non-obstructing mass was found in the rectum. The mass was traversable, circumferential and measured five cm in length. There was mild contact oozing present. This was biopsied and surgical pathology was consistent with adenocarcinoma.  The sigmoid colon mucosa proximal to the mass was otherwise normal appearing.  Non-bleeding small internal hemorrhoids were found during retroflexion.  His hemoglobin was up to 9.3 when I saw him in December.  At that time, we discussed options of palliation and referred him to Dr. Orlene Erm for some additional radiation.  He had COVID infection is December 2021.  Dakota Bennett completed a 3250 cGy course of radiation, a total of 13 fractions, on January 21st 2022.  CT imaging from February revealed numerous pulmonary masses and nodules, which are significantly increased in size, consistent with worsened pulmonary metastatic disease.  An index mass of the dependent left lower lobe measures  4.0 x 3.2 cm, previously 2.8 x 2.5 cm. Another index mass of the subpleural right upper lobe measures 3.6 x 2.9 cm, previously 2.4 x 2.4 cm.  There is also interval enlargement of a hypodense AP window lymph node now measuring 1.9 x 1.4 cm, previously 1.8 x 1.1 cm, consistent with worsened nodal metastatic disease.  No significant interval change in the circumferential wall thickening of the distal sigmoid colon and rectum with adjacent fat stranding in the low pelvis, consistent with post treatment appearance of primary colorectal malignancy.   INTERVAL HISTORY:  He is here for routine follow up and states that he continues to do well. He has been well since his last visit. He continues to have some mild rectal pain. He denies fever,  chills, nausea or vomiting. He denies issue with bowel or bladder. He denies shortness of breath, chest pain or cough. CBC today reveals decreased platelet count 87 and CMP reveals improved liver enzymes.   REVIEW OF SYSTEMS:  Review of Systems  Constitutional: Negative for appetite change, chills, diaphoresis, fatigue, fever and unexpected weight change.  HENT:   Negative for hearing loss, lump/mass, mouth sores, nosebleeds, sore throat, tinnitus, trouble swallowing and voice change.   Eyes: Negative for eye problems and icterus.  Respiratory: Negative for chest tightness, cough, hemoptysis, shortness of breath and wheezing.   Cardiovascular: Negative for chest pain, leg swelling and palpitations.  Gastrointestinal: Negative for abdominal distention, abdominal pain, blood in stool, constipation, diarrhea, nausea, rectal pain and vomiting.  Endocrine: Negative for hot flashes.  Genitourinary: Negative for bladder incontinence, difficulty urinating, dyspareunia, dysuria, frequency, hematuria and nocturia.   Musculoskeletal: Negative for arthralgias, back pain, flank pain, gait problem, myalgias, neck pain and neck stiffness.  Skin: Negative for itching, rash and wound.  Neurological: Negative for dizziness, extremity weakness, gait problem, headaches, light-headedness, numbness, seizures and speech difficulty.  Hematological: Negative for adenopathy. Does not bruise/bleed easily.  Psychiatric/Behavioral: Negative for confusion, decreased concentration, depression, sleep disturbance and suicidal ideas. The patient is not nervous/anxious.      VITALS:  Blood pressure (!) 173/74, pulse 76, temperature 97.8 F (36.6 C), temperature source Oral, resp. rate 18, height 5' 10" (1.778 m), weight 185 lb 11.2 oz (84.2 kg), SpO2 94 %.  Wt Readings from Last 3 Encounters:  03/18/21 185 lb 11.2 oz (84.2 kg)  02/04/21 185 lb 6.4 oz (84.1 kg)  01/10/21 180 lb 4.8 oz (81.8 kg)    Body mass index is 26.65  kg/m.  Performance status (ECOG): 1 - Symptomatic but completely ambulatory  PHYSICAL EXAM:  Physical Exam Constitutional:      General: He is not in acute distress.    Appearance: Normal appearance. He is normal weight. He is not ill-appearing, toxic-appearing or diaphoretic.  HENT:     Head: Normocephalic and atraumatic.     Nose: Nose normal. No congestion or rhinorrhea.     Mouth/Throat:     Mouth: Mucous membranes are moist.     Pharynx: Oropharynx is clear. No oropharyngeal exudate or posterior oropharyngeal erythema.  Eyes:     General: No scleral icterus.       Right eye: No discharge.        Left eye: No discharge.     Extraocular Movements: Extraocular movements intact.     Conjunctiva/sclera: Conjunctivae normal.     Pupils: Pupils are equal, round, and reactive to light.  Neck:     Vascular: No carotid bruit.  Cardiovascular:  Rate and Rhythm: Normal rate and regular rhythm.     Pulses: Normal pulses.     Heart sounds: Normal heart sounds. No murmur heard. No friction rub. No gallop.   Pulmonary:     Effort: Pulmonary effort is normal. No respiratory distress.     Breath sounds: Normal breath sounds. No stridor. No wheezing, rhonchi or rales.  Chest:     Chest wall: No tenderness.  Abdominal:     General: Abdomen is flat. Bowel sounds are normal. There is no distension.     Palpations: Abdomen is soft. There is no hepatomegaly, splenomegaly or mass.     Tenderness: There is no abdominal tenderness. There is no right CVA tenderness, left CVA tenderness, guarding or rebound.     Hernia: No hernia is present.  Musculoskeletal:        General: No swelling, tenderness, deformity or signs of injury. Normal range of motion.     Cervical back: Normal range of motion and neck supple. No rigidity or tenderness.     Right lower leg: No edema.     Left lower leg: No edema.  Lymphadenopathy:     Cervical: No cervical adenopathy.  Skin:    General: Skin is warm and  dry.     Capillary Refill: Capillary refill takes less than 2 seconds.     Coloration: Skin is not jaundiced or pale.     Findings: No bruising, erythema, lesion or rash.  Neurological:     General: No focal deficit present.     Mental Status: He is alert and oriented to person, place, and time. Mental status is at baseline.     Cranial Nerves: No cranial nerve deficit.     Sensory: No sensory deficit.     Motor: No weakness.     Coordination: Coordination normal.     Gait: Gait normal.     Deep Tendon Reflexes: Reflexes normal.  Psychiatric:        Mood and Affect: Mood normal.        Behavior: Behavior normal.        Thought Content: Thought content normal.        Judgment: Judgment normal.    LABS:   CBC Latest Ref Rng & Units 03/18/2021 02/04/2021 12/19/2020  WBC - 4.8 5.6 4.1  Hemoglobin 13.5 - 17.5 11.8(A) 11.6(A) 10.6(A)  Hematocrit 41 - 53 37(A) 37(A) 33(A)  Platelets 150 - 399 87(A) 92(A) 89(A)   CMP Latest Ref Rng & Units 03/18/2021 02/04/2021 12/19/2020  Glucose 70 - 99 mg/dL - - -  BUN 4 - _0 Creatinine 0.6 - 1.3 0.6 0.7 0.6  Sodium 137 - 147 136(A) 135(A) 138  Potassium 3.4 - 5.3 4.2 4.4 4.7  Chloride 99 - 108 106 106 107  CO2 13 - _1 24(A)  Calcium 8.7 - 10.7 8.5(A) 8.7 9.0  Total Protein 6.5 - 8.1 g/dL - - -  Total Bilirubin 0.3 - 1.2 mg/dL - - -  Alkaline Phos 25 - 125 126(A) 161(A) 157(A)  AST 14 - 40 31 98(A) 76(A)  ALT 10 - 40 27 191(A) 60(A)     Lab Results  Component Value Date   CEA1 501.0 (H) 02/04/2021   /  CEA  Date Value Ref Range Status  02/04/2021 501.0 (H) 0.0 - 4.7 ng/mL Final    Comment:    (NOTE)  Nonsmokers          <3.9                             Smokers             <5.6 [employer] Electrochemiluminescence Immunoassay (ECLIA) Values obtained with different assay methods or kits cannot be used interchangeably.  Results cannot be interpreted as absolute evidence of the presence  or absence of malignant disease. Performed At: [facility] [address] [place], [place] [id number] Rush Farmer MD LI:[id number]      STUDIES:   No current studies  Allergies:  Allergies  Allergen Reactions  . Lisinopril Hives       . Adhesive [Tape] Rash    Bandaids  . Aspirin Other (See Comments)    Burns stomach   . Neosporin [Neomycin-Bacitracin Zn-Polymyx] Rash    Current Medications: Current Outpatient Medications  Medication Sig Dispense Refill  . vitamin C (ASCORBIC ACID) 500 MG tablet Take 500 mg by mouth. ON OCCASSIONS    . atorvastatin (LIPITOR) 80 MG tablet Take 1 tablet (80 mg total) by mouth daily at 6 PM. 30 tablet 0  . clopidogrel (PLAVIX) 75 MG tablet Take 1 tablet (75 mg total) by mouth daily with breakfast. 30 tablet 0  . Diphenoxylate-Atropine (LOMOTIL PO) Take by mouth as needed.    . diphenoxylate-atropine (LOMOTIL) 2.5-0.025 MG tablet TAKE 1 TO 2 TABLETS BY MOUTH 4 TIMES DAILY AS NEEDED FOR DIARRHEA 30 tablet 1  . ezetimibe (ZETIA) 10 MG tablet TAKE 1 TABLET BY MOUTH EVERY DAY 90 tablet 3  . famotidine (PEPCID) 20 MG tablet Take 1 tablet (20 mg total) by mouth as needed for heartburn or indigestion. 30 tablet 0  . ferrous sulfate 325 (65 FE) MG tablet Take 1 tablet (325 mg total) by mouth daily. 30 tablet 3  . gabapentin (NEURONTIN) 300 MG capsule Take 300 mg by mouth at bedtime.    . hydrALAZINE (APRESOLINE) 25 MG tablet TAKE 1 TABLET BY MOUTH THREE TIMES A DAY 270 tablet 1  . isosorbide mononitrate (IMDUR) 30 MG 24 hr tablet Take 1 tablet (30 mg total) by mouth daily. 30 tablet 0  . metFORMIN (GLUCOPHAGE-XR) 500 MG 24 hr tablet Take 500 mg by mouth in the morning and at bedtime.     . metoprolol succinate (TOPROL-XL) 100 MG 24 hr tablet Take 100 mg by mouth daily. Take with or immediately following a meal.    . minoxidil (LONITEN) 2.5 MG tablet Take 2 tablets (5 mg total) by mouth 2 (two) times daily. 360 tablet 3  . nitroGLYCERIN  (NITROSTAT) 0.4 MG SL tablet PLACE 1 TABLET (0.4 MG TOTAL) UNDER THE TONGUE EVERY 5 (FIVE) MINUTES AS NEEDED FOR CHEST PAIN. 75 tablet 1  . pantoprazole (PROTONIX) 40 MG tablet TAKE 1 TABLET BY MOUTH EVERY DAY (Patient taking differently: as needed.) 90 tablet 1  . polyethylene glycol (MIRALAX / GLYCOLAX) 17 g packet Take 17 g by mouth daily. 14 each 0  . Potassium Chloride ER 20 MEQ TBCR TAKE 1 TABLET BY MOUTH 4 TIMES A DAY 360 tablet 3  . prochlorperazine (COMPAZINE) 10 MG tablet Take 1 tablet (10 mg total) by mouth every 6 (six) hours as needed. 60 tablet 5   No current facility-administered medications for this visit.     ASSESSMENT & PLAN:   Assessment:   1. Metastatic colorectal cancer to the lung.  He  had mild progression of disease on palliative infusional 5 fluorouracil/leucovorin/bevacizumab every 3 weeks, so was switched back to infusional 5 fluorouracil/leucovorin/bevacizumab every 2 weeks.  He had minimal progression of disease from February to July 2020, so we continued the same regimen.  Bevacizumab was discontinued in May 2020 due to rectal bleeding.  CT imaging in July 2020 revealed progressive disease both in the lungs and thickening of the rectum, so chemotherapy was discontinued and the patient was placed on observation.  He has had continued slow progression of his disease on imaging, as well as an increasing CEA.  CT imaging in October 2021 revealed progressive pulmonary metastasis and a new 11 mm left mediastinal lymph node.  The patient at that time desired to continue observation, but was willing to get back on treatment if his disease significantly worsened.  He completed palliative radiation on January 21st.  CT imaging from February has shown interval progression of disease, and so we did ecommend chemotherapy.  His main option would be FOLFIRI but we could reduce doses due to his age and cytopenias.  He wishes to hold off on therapy for now as he is physically doing well. He  continues to do well.   2.  Rectal cancer which has not been resected due to the metastatic disease.  He had a hospitalization due to significant GI bleed in November 2021.  His hemoglobin dropped down to 6 at that time, and he received 2 units of PRBCs.  Sigmoidoscopy confirmed a 5 cm fungating and ulcerated non-obstructing mass in the rectum and biopsy was consistent with adenocarcinoma.  Therefore, he has received palliative radiation.   3. Thrombocytopenia, secondary to chemotherapy and splenomegaly, which is fairly stable.  6. Splenomegaly and liver cirrhosis on CT imaging, which is likely contributing to his thrombocytopenia.  Physical exam is benign   Plan: He continues to do well and wishes to remain off of therapy. His liver enzymes have normalized. We will continue to monitor his platelets. We will obtain CT imaging at his next appointment in June.   He verbalizes understanding of and agreement to the plans discussed today. He knows to call the office should any new questions or concerns arise.    [name], NP [facility] [address] [place] [address] Dept: [phone] Dept Fax: [phone]

## 2021-03-19 LAB — CEA: CEA: 398 ng/mL — ABNORMAL HIGH (ref 0.0–4.7)

## 2021-03-26 DIAGNOSIS — D61818 Other pancytopenia: Secondary | ICD-10-CM | POA: Diagnosis not present

## 2021-03-26 DIAGNOSIS — C7800 Secondary malignant neoplasm of unspecified lung: Secondary | ICD-10-CM | POA: Diagnosis not present

## 2021-03-26 DIAGNOSIS — E1165 Type 2 diabetes mellitus with hyperglycemia: Secondary | ICD-10-CM | POA: Diagnosis not present

## 2021-03-26 DIAGNOSIS — C20 Malignant neoplasm of rectum: Secondary | ICD-10-CM | POA: Diagnosis not present

## 2021-04-19 ENCOUNTER — Other Ambulatory Visit: Payer: Self-pay | Admitting: Hematology and Oncology

## 2021-04-19 DIAGNOSIS — R197 Diarrhea, unspecified: Secondary | ICD-10-CM

## 2021-04-29 ENCOUNTER — Inpatient Hospital Stay: Payer: Medicare Other | Attending: Oncology

## 2021-04-29 ENCOUNTER — Other Ambulatory Visit: Payer: Self-pay

## 2021-04-29 ENCOUNTER — Encounter: Payer: Self-pay | Admitting: Hematology and Oncology

## 2021-04-29 ENCOUNTER — Inpatient Hospital Stay: Payer: Medicare Other | Admitting: Hematology and Oncology

## 2021-04-29 DIAGNOSIS — C7800 Secondary malignant neoplasm of unspecified lung: Secondary | ICD-10-CM | POA: Diagnosis not present

## 2021-04-29 DIAGNOSIS — C20 Malignant neoplasm of rectum: Secondary | ICD-10-CM | POA: Diagnosis not present

## 2021-04-29 DIAGNOSIS — D649 Anemia, unspecified: Secondary | ICD-10-CM | POA: Diagnosis not present

## 2021-04-29 LAB — HEPATIC FUNCTION PANEL
ALT: 23 (ref 10–40)
AST: 30 (ref 14–40)
Alkaline Phosphatase: 136 — AB (ref 25–125)
Bilirubin, Total: 0.7

## 2021-04-29 LAB — CBC AND DIFFERENTIAL
HCT: 40 — AB (ref 41–53)
Hemoglobin: 12.9 — AB (ref 13.5–17.5)
Neutrophils Absolute: 4.66
Platelets: 97 — AB (ref 150–399)
WBC: 6.3

## 2021-04-29 LAB — COMPREHENSIVE METABOLIC PANEL
Albumin: 4.2 (ref 3.5–5.0)
Calcium: 9 (ref 8.7–10.7)

## 2021-04-29 LAB — BASIC METABOLIC PANEL
BUN: 13 (ref 4–21)
CO2: 23 — AB (ref 13–22)
Chloride: 105 (ref 99–108)
Creatinine: 0.6 (ref 0.6–1.3)
Glucose: 116
Potassium: 5 (ref 3.4–5.3)
Sodium: 135 — AB (ref 137–147)

## 2021-04-29 LAB — CBC: RBC: 4.64 (ref 3.87–5.11)

## 2021-04-29 NOTE — Progress Notes (Deleted)
[facility]  [address] [place],  [place]  [address] [phone]  Clinic Day:  04/29/2021  Referring physician: Serita Grammes, MD    CHIEF COMPLAINT:  CC: A 75 year old male with history of stage IIIA rectosigmoid adenocarcinoma here for 6 week evaluation.  Current Treatment:  Observation   HISTORY OF PRESENT ILLNESS:  Bence Trapp is a 75 y.o. 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 This was found because of a positive Cologuard test, but he did have some rectal bleeding for several months.  Colonoscopy revealed an ulcerated mass at the proximal rectum at 9 cm and extending from the proximal rectum to the rectosigmoid junction.  An endoscopic ultrasound was done and confirmed a nonobstructing medium-sized mass of the proximal rectum involving 2/3 of the circumference and measuring 3 cm long, occurring 10 cm from the anal verge.  He also had 2 rounded suspicious perirectal lymph nodes measuring 5 and 6 mm.  CT scans of the abdomen and pelvis revealed several rounded nodules at the lung bases, which were tiny and largely unchanged from prior scans, so felt to likely be benign. CEA was elevated at 28.3.  He received neoadjuvant chemoradiation with 5 fluorouracil infusion for the 1st and 5th week of radiation and completed treatment the end of June.  He tolerated this fairly well, but had severe dysuria, as well as urinary hesitancy and soft stools.  We scheduled him for CT chest, abdomen and pelvis and referred him to Dr. Amalia Hailey for consideration of surgery.  However, his CT chest revealed several pulmonary nodules, which had enlarged.  There was generalized wall thickening in the rectum, which could be due to the radiotherapy.  He underwent PET scan, which revealed increased uptake in the pulmonary nodules felt to be consistent with metastatic disease.  There was no evidence of  persistence of disease in the rectum or other areas of metastasis.  His surgical plans were canceled.  The CEA was normal at that time. We obtained KRAS testing on his tumor, which unfortunately did show a mutation.  MMR testing of the tumor was normal.  We recommended palliative chemotherapy with FOLFOX/bevacizumab.  The patient had persistent thrombocytopenia, so we reduced his chemotherapy doses by a total of 25%.  Repeat imaging after 6 cycles of FOLFOX/bevacizumab was overall stable, there was some increase in the larger pulmonary nodules, however, they appeared necrotic indicating a response to therapy.   After 12 cycles of FOLFOX/bevacizumab restaging scans were obtained that showed the dominant left lower lobe lesion had substantially decreased in size and no new or progressive pulmonary nodule or masses were seen.  Oxaliplatin was discontinued due to neuropathy in January 2019.  Repeat CT imaging after 20 cycles revealed stable to decreased pulmonary nodules with no other evidence of metastasis.    He was continued on palliative chemotherapy with occasional changes in dosing and schedule.  He has had several episodes of rectal bleeding.  Dr. Lyndel Safe performed sigmoidoscopy in March 2020 which did not reveal any evidence of disease in the rectum.  He had worsening neuropathy of the feet, so was started on gabapentin 300 mg at bedtime and the dose was increased but later stopped.  While having a 39th cycle of infusional 5-fluorouracil, he contacted [place] to report episodes of chest pain since starting his treatment with radiation down the arms and an episode of emesis.  He was referred  to the emergency department and found to have had a non STEM in March 2020I.  His 5 FU chemotherapy pump was discontinued.  He was transferred to [facility] for further evaluation and treatment, and had 3 stents placed in early April. Per Dr. Joya Gaskins telehealth note, the patient had residual LAD disease and recommended he be  treated medically unless he has refractory angina, as he would require atherectomy of a proximal LAD lesion.  Dr. Bettina Gavia recommended discontinuing the gabapentin.  He placed the patient back on magnesium oxide 400 mg daily.  He was also started on clopidogrel 75 mg daily, as well as isosorbide mononitrate 30 mg once a day, amlodipine 5 mg once a day, atorvastatin 80 mg once a day and aspirin 81 mg daily.  Chemotherapy was held due to his recent non STEMI and resumed in May 2020.  Bevacizumab was discontinued due to recurrent rectal bleeding in June 2020 and the dose of chemotherapy was reduced by 10% due to worsening thrombocytopenia.  CT imaging in July 2020 revealed a mild interval increase in size of the extensive bilateral pulmonary nodules, as well as increased wall thickening of the rectum.  He also was found to have increasing splenomegaly.  His hemoglobin had also dropped to 7.8, and his platelet count had dropped to 80,000.  We therefore stopped his treatment due to progressive disease, as well as worsening anemia and thrombocytopenia.  He had evidence of recurrent iron deficiency, so received IV iron in July with improvement in his anemia.  Since July of 2020 he has had slow steady progression of his lung metastases but has remained asymptomatic.  His CEA has steadily increased as well.  CT chest, abdomen and pelvis from October 2021 revealed mild progression of the diffuse bilateral pulmonary metastases and a new 11 mm left mediastinal lymph node.  Index nodule in the medial left lower lobe currently measures 3.4 x 2.7 cm, compared to 2.8 x 2.2 cm previously.  Otherwise, the exam was stable.  CEA from October was 457.0.  Vasilios presented to [facility] on November 22nd due to 5 episodes of bright red blood per rectum with dark clotting that day and was admitted.  His hemoglobin dropped down to 6 during his stay and he received 2 units of PRBCs on November 24th.  He was advised to hold Plavix.  He  underwent a flexible sigmoidoscopy on November 25th revealing a single small ulcer with a prominent visble vessel in the distal rectum. For hemostasis, one hemostatic clip was successfully placed. There was no bleeding at the end of the procedure.  A fungating and ulcerated non-obstructing mass was found in the rectum. The mass was traversable, circumferential and measured five cm in length. There was mild contact oozing present. This was biopsied and surgical pathology was consistent with adenocarcinoma.  The sigmoid colon mucosa proximal to the mass was otherwise normal appearing.  Non-bleeding small internal hemorrhoids were found during retroflexion.  His hemoglobin was up to 9.3 when I saw him in December.  At that time, we discussed options of palliation and referred him to Dr. Orlene Erm for some additional radiation.  He had COVID infection is December 2021.  Zaeem completed a 3250 cGy course of radiation, a total of 13 fractions, on January 21st 2022.  CT imaging from February revealed numerous pulmonary masses and nodules, which are significantly increased in size, consistent with worsened pulmonary metastatic disease.  An index mass of the dependent left lower lobe measures  4.0 x 3.2 cm, previously 2.8 x 2.5 cm. Another index mass of the subpleural right upper lobe measures 3.6 x 2.9 cm, previously 2.4 x 2.4 cm.  There is also interval enlargement of a hypodense AP window lymph node now measuring 1.9 x 1.4 cm, previously 1.8 x 1.1 cm, consistent with worsened nodal metastatic disease.  No significant interval change in the circumferential wall thickening of the distal sigmoid colon and rectum with adjacent fat stranding in the low pelvis, consistent with post treatment appearance of primary colorectal malignancy.   INTERVAL HISTORY:  He is here for routine follow up and states that he continues to do well. He has been well since his last visit. He continues to have some mild rectal pain. He denies fever,  chills, nausea or vomiting. He denies issue with bowel or bladder. He denies shortness of breath, chest pain or cough. CBC today reveals decreased platelet count 87 and CMP reveals improved liver enzymes.   REVIEW OF SYSTEMS:  Review of Systems  Constitutional: Negative for appetite change, chills, diaphoresis, fatigue, fever and unexpected weight change.  HENT:   Negative for hearing loss, lump/mass, mouth sores, nosebleeds, sore throat, tinnitus, trouble swallowing and voice change.   Eyes: Negative for eye problems and icterus.  Respiratory: Negative for chest tightness, cough, hemoptysis, shortness of breath and wheezing.   Cardiovascular: Negative for chest pain, leg swelling and palpitations.  Gastrointestinal: Negative for abdominal distention, abdominal pain, blood in stool, constipation, diarrhea, nausea, rectal pain and vomiting.  Endocrine: Negative for hot flashes.  Genitourinary: Negative for bladder incontinence, difficulty urinating, dyspareunia, dysuria, frequency, hematuria and nocturia.   Musculoskeletal: Negative for arthralgias, back pain, flank pain, gait problem, myalgias, neck pain and neck stiffness.  Skin: Negative for itching, rash and wound.  Neurological: Negative for dizziness, extremity weakness, gait problem, headaches, light-headedness, numbness, seizures and speech difficulty.  Hematological: Negative for adenopathy. Does not bruise/bleed easily.  Psychiatric/Behavioral: Negative for confusion, decreased concentration, depression, sleep disturbance and suicidal ideas. The patient is not nervous/anxious.      VITALS:  There were no vitals taken for this visit.  Wt Readings from Last 3 Encounters:  03/18/21 185 lb 11.2 oz (84.2 kg)  02/04/21 185 lb 6.4 oz (84.1 kg)  01/10/21 180 lb 4.8 oz (81.8 kg)    There is no height or weight on file to calculate BMI.  Performance status (ECOG): 1 - Symptomatic but completely ambulatory  PHYSICAL EXAM:  Physical  Exam Constitutional:      General: He is not in acute distress.    Appearance: Normal appearance. He is normal weight. He is not ill-appearing, toxic-appearing or diaphoretic.  HENT:     Head: Normocephalic and atraumatic.     Nose: Nose normal. No congestion or rhinorrhea.     Mouth/Throat:     Mouth: Mucous membranes are moist.     Pharynx: Oropharynx is clear. No oropharyngeal exudate or posterior oropharyngeal erythema.  Eyes:     General: No scleral icterus.       Right eye: No discharge.        Left eye: No discharge.     Extraocular Movements: Extraocular movements intact.     Conjunctiva/sclera: Conjunctivae normal.     Pupils: Pupils are equal, round, and reactive to light.  Neck:     Vascular: No carotid bruit.  Cardiovascular:     Rate and Rhythm: Normal rate and regular rhythm.     Pulses: Normal pulses.  Heart sounds: Normal heart sounds. No murmur heard. No friction rub. No gallop.   Pulmonary:     Effort: Pulmonary effort is normal. No respiratory distress.     Breath sounds: Normal breath sounds. No stridor. No wheezing, rhonchi or rales.  Chest:     Chest wall: No tenderness.  Abdominal:     General: Abdomen is flat. Bowel sounds are normal. There is no distension.     Palpations: Abdomen is soft. There is no hepatomegaly, splenomegaly or mass.     Tenderness: There is no abdominal tenderness. There is no right CVA tenderness, left CVA tenderness, guarding or rebound.     Hernia: No hernia is present.  Musculoskeletal:        General: No swelling, tenderness, deformity or signs of injury. Normal range of motion.     Cervical back: Normal range of motion and neck supple. No rigidity or tenderness.     Right lower leg: No edema.     Left lower leg: No edema.  Lymphadenopathy:     Cervical: No cervical adenopathy.  Skin:    General: Skin is warm and dry.     Capillary Refill: Capillary refill takes less than 2 seconds.     Coloration: Skin is not jaundiced  or pale.     Findings: No bruising, erythema, lesion or rash.  Neurological:     General: No focal deficit present.     Mental Status: He is alert and oriented to person, place, and time. Mental status is at baseline.     Cranial Nerves: No cranial nerve deficit.     Sensory: No sensory deficit.     Motor: No weakness.     Coordination: Coordination normal.     Gait: Gait normal.     Deep Tendon Reflexes: Reflexes normal.  Psychiatric:        Mood and Affect: Mood normal.        Behavior: Behavior normal.        Thought Content: Thought content normal.        Judgment: Judgment normal.    LABS:   CBC Latest Ref Rng & Units 03/18/2021 02/04/2021 12/19/2020  WBC - 4.8 5.6 4.1  Hemoglobin 13.5 - 17.5 11.8(A) 11.6(A) 10.6(A)  Hematocrit 41 - 53 37(A) 37(A) 33(A)  Platelets 150 - 399 87(A) 92(A) 89(A)   CMP Latest Ref Rng & Units 03/18/2021 02/04/2021 12/19/2020  Glucose 70 - 99 mg/dL - - -  BUN 4 - _0 Creatinine 0.6 - 1.3 0.6 0.7 0.6  Sodium 137 - 147 136(A) 135(A) 138  Potassium 3.4 - 5.3 4.2 4.4 4.7  Chloride 99 - 108 106 106 107  CO2 13 - _1 24(A)  Calcium 8.7 - 10.7 8.5(A) 8.7 9.0  Total Protein 6.5 - 8.1 g/dL - - -  Total Bilirubin 0.3 - 1.2 mg/dL - - -  Alkaline Phos 25 - 125 126(A) 161(A) 157(A)  AST 14 - 40 31 98(A) 76(A)  ALT 10 - 40 27 191(A) 60(A)     Lab Results  Component Value Date   CEA1 398.0 (H) 03/18/2021   /  CEA  Date Value Ref Range Status  03/18/2021 398.0 (H) 0.0 - 4.7 ng/mL Final    Comment:    (NOTE)                             Nonsmokers          <  3.9                             Smokers             <5.6 [employer] Electrochemiluminescence Immunoassay (ECLIA) Values obtained with different assay methods or kits cannot be used interchangeably.  Results cannot be interpreted as absolute evidence of the presence or absence of malignant disease. Performed At: [facility] [place], [place]  [id number] Rush Farmer MD PT:[phone]      STUDIES:   No current studies  Allergies:  Allergies  Allergen Reactions  . Lisinopril Hives       . Adhesive [Tape] Rash    Bandaids  . Aspirin Other (See Comments)    Burns stomach   . Neosporin [Neomycin-Bacitracin Zn-Polymyx] Rash    Current Medications: Current Outpatient Medications  Medication Sig Dispense Refill  . diphenoxylate-atropine (LOMOTIL) 2.5-0.025 MG tablet TAKE 1 TO 2 TABLETS BY MOUTH 4 TIMES DAILY AS NEEDED FOR DIARRHEA 30 tablet 1  . atorvastatin (LIPITOR) 80 MG tablet Take 1 tablet (80 mg total) by mouth daily at 6 PM. 30 tablet 0  . clopidogrel (PLAVIX) 75 MG tablet Take 1 tablet (75 mg total) by mouth daily with breakfast. 30 tablet 0  . Diphenoxylate-Atropine (LOMOTIL PO) Take by mouth as needed.    . ezetimibe (ZETIA) 10 MG tablet TAKE 1 TABLET BY MOUTH EVERY DAY 90 tablet 3  . famotidine (PEPCID) 20 MG tablet Take 1 tablet (20 mg total) by mouth as needed for heartburn or indigestion. 30 tablet 0  . ferrous sulfate 325 (65 FE) MG tablet Take 1 tablet (325 mg total) by mouth daily. 30 tablet 3  . gabapentin (NEURONTIN) 300 MG capsule Take 300 mg by mouth at bedtime.    . hydrALAZINE (APRESOLINE) 25 MG tablet TAKE 1 TABLET BY MOUTH THREE TIMES A DAY 270 tablet 1  . isosorbide mononitrate (IMDUR) 30 MG 24 hr tablet Take 1 tablet (30 mg total) by mouth daily. 30 tablet 0  . metFORMIN (GLUCOPHAGE-XR) 500 MG 24 hr tablet Take 500 mg by mouth in the morning and at bedtime.     . metoprolol succinate (TOPROL-XL) 100 MG 24 hr tablet Take 100 mg by mouth daily. Take with or immediately following a meal.    . minoxidil (LONITEN) 2.5 MG tablet Take 2 tablets (5 mg total) by mouth 2 (two) times daily. 360 tablet 3  . nitroGLYCERIN (NITROSTAT) 0.4 MG SL tablet PLACE 1 TABLET (0.4 MG TOTAL) UNDER THE TONGUE EVERY 5 (FIVE) MINUTES AS NEEDED FOR CHEST PAIN. 75 tablet 1  . pantoprazole (PROTONIX) 40 MG tablet TAKE 1  TABLET BY MOUTH EVERY DAY (Patient taking differently: as needed.) 90 tablet 1  . polyethylene glycol (MIRALAX / GLYCOLAX) 17 g packet Take 17 g by mouth daily. 14 each 0  . Potassium Chloride ER 20 MEQ TBCR TAKE 1 TABLET BY MOUTH 4 TIMES A DAY 360 tablet 3  . prochlorperazine (COMPAZINE) 10 MG tablet Take 1 tablet (10 mg total) by mouth every 6 (six) hours as needed. 60 tablet 5  . vitamin C (ASCORBIC ACID) 500 MG tablet Take 500 mg by mouth. ON OCCASSIONS     No current facility-administered medications for this visit.     ASSESSMENT & PLAN:   Assessment:   1. Metastatic colorectal cancer to the lung.  He had mild progression of disease on palliative infusional 5 fluorouracil/leucovorin/bevacizumab  every 3 weeks, so was switched back to infusional 5 fluorouracil/leucovorin/bevacizumab every 2 weeks.  He had minimal progression of disease from February to July 2020, so we continued the same regimen.  Bevacizumab was discontinued in May 2020 due to rectal bleeding.  CT imaging in July 2020 revealed progressive disease both in the lungs and thickening of the rectum, so chemotherapy was discontinued and the patient was placed on observation.  He has had continued slow progression of his disease on imaging, as well as an increasing CEA.  CT imaging in October 2021 revealed progressive pulmonary metastasis and a new 11 mm left mediastinal lymph node.  The patient at that time desired to continue observation, but was willing to get back on treatment if his disease significantly worsened.  He completed palliative radiation on January 21st.  CT imaging from February has shown interval progression of disease, and so we did ecommend chemotherapy.  His main option would be FOLFIRI but we could reduce doses due to his age and cytopenias.  He wishes to hold off on therapy for now as he is physically doing well. He continues to do well.   2.  Rectal cancer which has not been resected due to the metastatic disease.   He had a hospitalization due to significant GI bleed in November 2021.  His hemoglobin dropped down to 6 at that time, and he received 2 units of PRBCs.  Sigmoidoscopy confirmed a 5 cm fungating and ulcerated non-obstructing mass in the rectum and biopsy was consistent with adenocarcinoma.  Therefore, he has received palliative radiation.   3. Thrombocytopenia, secondary to chemotherapy and splenomegaly, which is fairly stable.  6. Splenomegaly and liver cirrhosis on CT imaging, which is likely contributing to his thrombocytopenia.  Physical exam is benign   Plan: He continues to do well and wishes to remain off of therapy. His liver enzymes have normalized. We will continue to monitor his platelets. We will obtain CT imaging at his next appointment in June.   He verbalizes understanding of and agreement to the plans discussed today. He knows to call the office should any new questions or concerns arise.    [name], NP [facility] AT [facility] [address] [place] [address] Dept: [phone] Dept Fax: [phone]

## 2021-04-30 DIAGNOSIS — C20 Malignant neoplasm of rectum: Secondary | ICD-10-CM | POA: Diagnosis not present

## 2021-04-30 DIAGNOSIS — Z85038 Personal history of other malignant neoplasm of large intestine: Secondary | ICD-10-CM | POA: Diagnosis not present

## 2021-04-30 DIAGNOSIS — I2510 Atherosclerotic heart disease of native coronary artery without angina pectoris: Secondary | ICD-10-CM | POA: Diagnosis not present

## 2021-04-30 DIAGNOSIS — K4020 Bilateral inguinal hernia, without obstruction or gangrene, not specified as recurrent: Secondary | ICD-10-CM | POA: Diagnosis not present

## 2021-04-30 DIAGNOSIS — K6389 Other specified diseases of intestine: Secondary | ICD-10-CM | POA: Diagnosis not present

## 2021-04-30 DIAGNOSIS — Z85118 Personal history of other malignant neoplasm of bronchus and lung: Secondary | ICD-10-CM | POA: Diagnosis not present

## 2021-04-30 DIAGNOSIS — C7800 Secondary malignant neoplasm of unspecified lung: Secondary | ICD-10-CM | POA: Diagnosis not present

## 2021-04-30 LAB — CEA: CEA: 442 ng/mL — ABNORMAL HIGH (ref 0.0–4.7)

## 2021-05-01 ENCOUNTER — Inpatient Hospital Stay (INDEPENDENT_AMBULATORY_CARE_PROVIDER_SITE_OTHER): Payer: Medicare Other | Admitting: Hematology and Oncology

## 2021-05-01 ENCOUNTER — Other Ambulatory Visit: Payer: Self-pay | Admitting: Cardiology

## 2021-05-01 ENCOUNTER — Other Ambulatory Visit: Payer: Self-pay | Admitting: Hematology and Oncology

## 2021-05-01 ENCOUNTER — Other Ambulatory Visit: Payer: Self-pay

## 2021-05-01 ENCOUNTER — Encounter: Payer: Self-pay | Admitting: Hematology and Oncology

## 2021-05-01 ENCOUNTER — Telehealth: Payer: Self-pay | Admitting: Oncology

## 2021-05-01 VITALS — BP 166/74 | HR 76 | Temp 98.1°F | Resp 18 | Ht 70.0 in | Wt 180.5 lb

## 2021-05-01 DIAGNOSIS — C20 Malignant neoplasm of rectum: Secondary | ICD-10-CM

## 2021-05-01 DIAGNOSIS — K219 Gastro-esophageal reflux disease without esophagitis: Secondary | ICD-10-CM

## 2021-05-01 NOTE — Progress Notes (Signed)
Dakota Bennett  [address] [place],  [place]  [address] [phone]  Clinic Day:  05/01/2021  Referring physician: Serita Grammes, MD    CHIEF COMPLAINT:  CC: A 75 year old male with history of stage IIIA rectosigmoid adenocarcinoma here for 6 week evaluation.  Current Treatment:  Observation   HISTORY OF PRESENT ILLNESS:  Dakota Bennett is a 75 y.o. 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 This was found because of a positive Cologuard test, but he did have some rectal bleeding for several months.  Colonoscopy revealed an ulcerated mass at the proximal rectum at 9 cm and extending from the proximal rectum to the rectosigmoid junction.  An endoscopic ultrasound was done and confirmed a nonobstructing medium-sized mass of the proximal rectum involving 2/3 of the circumference and measuring 3 cm long, occurring 10 cm from the anal verge.  He also had 2 rounded suspicious perirectal lymph nodes measuring 5 and 6 mm.  CT scans of the abdomen and pelvis revealed several rounded nodules at the lung bases, which were tiny and largely unchanged from prior scans, so felt to likely be benign. CEA was elevated at 28.3.  He received neoadjuvant chemoradiation with 5 fluorouracil infusion for the 1st and 5th week of radiation and completed treatment the end of June.  He tolerated this fairly well, but had severe dysuria, as well as urinary hesitancy and soft stools.  We scheduled him for CT chest, abdomen and pelvis and referred him to Dr. Amalia Hailey for consideration of surgery.  However, his CT chest revealed several pulmonary nodules, which had enlarged.  There was generalized wall thickening in the rectum, which could be due to the radiotherapy.  He underwent PET scan, which revealed increased uptake in the pulmonary nodules felt to be consistent with metastatic disease.  There was no evidence of  persistence of disease in the rectum or other areas of metastasis.  His surgical plans were canceled.  The CEA was normal at that time. We obtained KRAS testing on his tumor, which unfortunately did show a mutation.  MMR testing of the tumor was normal.  We recommended palliative chemotherapy with FOLFOX/bevacizumab.  The patient had persistent thrombocytopenia, so we reduced his chemotherapy doses by a total of 25%.  Repeat imaging after 6 cycles of FOLFOX/bevacizumab was overall stable, there was some increase in the larger pulmonary nodules, however, they appeared necrotic indicating a response to therapy.   After 12 cycles of FOLFOX/bevacizumab restaging scans were obtained that showed the dominant left lower lobe lesion had substantially decreased in size and no new or progressive pulmonary nodule or masses were seen.  Oxaliplatin was discontinued due to neuropathy in January 2019.  Repeat CT imaging after 20 cycles revealed stable to decreased pulmonary nodules with no other evidence of metastasis.    He was continued on palliative chemotherapy with occasional changes in dosing and schedule.  He has had several episodes of rectal bleeding.  Dr. Lyndel Safe performed sigmoidoscopy in March 2020 which did not reveal any evidence of disease in the rectum.  He had worsening neuropathy of the feet, so was started on gabapentin 300 mg at bedtime and the dose was increased but later stopped.  While having a 39th cycle of infusional 5-fluorouracil, he contacted [place] to report episodes of chest pain since starting his treatment with radiation down the arms and an episode of emesis.  He was referred  to the emergency department and found to have had a non STEM in March 2020I.  His 5 FU chemotherapy pump was discontinued.  He was transferred to [facility] for further evaluation and treatment, and had 3 stents placed in early April. Per Dr. Joya Gaskins telehealth note, the patient had residual LAD disease and recommended he be  treated medically unless he has refractory angina, as he would require atherectomy of a proximal LAD lesion.  Dr. Bettina Gavia recommended discontinuing the gabapentin.  He placed the patient back on magnesium oxide 400 mg daily.  He was also started on clopidogrel 75 mg daily, as well as isosorbide mononitrate 30 mg once a day, amlodipine 5 mg once a day, atorvastatin 80 mg once a day and aspirin 81 mg daily.  Chemotherapy was held due to his recent non STEMI and resumed in May 2020.  Bevacizumab was discontinued due to recurrent rectal bleeding in June 2020 and the dose of chemotherapy was reduced by 10% due to worsening thrombocytopenia.  CT imaging in July 2020 revealed a mild interval increase in size of the extensive bilateral pulmonary nodules, as well as increased wall thickening of the rectum.  He also was found to have increasing splenomegaly.  His hemoglobin had also dropped to 7.8, and his platelet count had dropped to 80,000.  We therefore stopped his treatment due to progressive disease, as well as worsening anemia and thrombocytopenia.  He had evidence of recurrent iron deficiency, so received IV iron in July with improvement in his anemia.  Since July of 2020 he has had slow steady progression of his lung metastases but has remained asymptomatic.  His CEA has steadily increased as well.  CT chest, abdomen and pelvis from October 2021 revealed mild progression of the diffuse bilateral pulmonary metastases and a new 11 mm left mediastinal lymph node.  Index nodule in the medial left lower lobe currently measures 3.4 x 2.7 cm, compared to 2.8 x 2.2 cm previously.  Otherwise, the exam was stable.  CEA from October was 457.0.  Dakota Bennett presented to [facility] on November 22nd due to 5 episodes of bright red blood per rectum with dark clotting that day and was admitted.  His hemoglobin dropped down to 6 during his stay and he received 2 units of PRBCs on November 24th.  He was advised to hold Plavix.  He  underwent a flexible sigmoidoscopy on November 25th revealing a single small ulcer with a prominent visble vessel in the distal rectum. For hemostasis, one hemostatic clip was successfully placed. There was no bleeding at the end of the procedure.  A fungating and ulcerated non-obstructing mass was found in the rectum. The mass was traversable, circumferential and measured five cm in length. There was mild contact oozing present. This was biopsied and surgical pathology was consistent with adenocarcinoma.  The sigmoid colon mucosa proximal to the mass was otherwise normal appearing.  Non-bleeding small internal hemorrhoids were found during retroflexion.  His hemoglobin was up to 9.3 when I saw him in December.  At that time, we discussed options of palliation and referred him to Dr. Orlene Erm for some additional radiation.  He had COVID infection is December 2021.  Dakota Bennett completed a 3250 cGy course of radiation, a total of 13 fractions, on January 21st 2022.  CT imaging from February revealed numerous pulmonary masses and nodules, which are significantly increased in size, consistent with worsened pulmonary metastatic disease.  An index mass of the dependent left lower lobe measures  4.0 x 3.2 cm, previously 2.8 x 2.5 cm. Another index mass of the subpleural right upper lobe measures 3.6 x 2.9 cm, previously 2.4 x 2.4 cm.  There is also interval enlargement of a hypodense AP window lymph node now measuring 1.9 x 1.4 cm, previously 1.8 x 1.1 cm, consistent with worsened nodal metastatic disease.  No significant interval change in the circumferential wall thickening of the distal sigmoid colon and rectum with adjacent fat stranding in the low pelvis, consistent with post treatment appearance of primary colorectal malignancy.   INTERVAL HISTORY:  He is here for evaluation and review of recent CT imaging. He has been well since last visit. He remains active, although he states he is unable to lift or do as strenuous  work as he has done in the past. He denies fever, chills, nausea or vomiting. He denies shortness of breath, chest pain or cough. He continues to have bouts of diarrhea. CBC revealed a platelet count of 97. CMP was unremarkable. CEA was elevated at 442 from 398 at last visit. CT imaging revealed mildly increased size to some pulmonary nodules; similar appearance of circumferential wall thickening of the distal sigmoid colon and rectum with surrounding soft tissue stranding; no sign of abdominopelvic adenopathy or solid organ metastasis within the abdomen.  REVIEW OF SYSTEMS:  Review of Systems  Constitutional:  Negative for appetite change, chills, diaphoresis, fatigue, fever and unexpected weight change.  HENT:   Negative for hearing loss, lump/mass, mouth sores, nosebleeds, sore throat, tinnitus, trouble swallowing and voice change.   Eyes:  Negative for eye problems and icterus.  Respiratory:  Negative for chest tightness, cough, hemoptysis, shortness of breath and wheezing.   Cardiovascular:  Negative for chest pain, leg swelling and palpitations.  Gastrointestinal:  Negative for abdominal distention, abdominal pain, blood in stool, constipation, diarrhea, nausea, rectal pain and vomiting.  Endocrine: Negative for hot flashes.  Genitourinary:  Negative for bladder incontinence, difficulty urinating, dyspareunia, dysuria, frequency, hematuria and nocturia.   Musculoskeletal:  Negative for arthralgias, back pain, flank pain, gait problem, myalgias, neck pain and neck stiffness.  Skin:  Negative for itching, rash and wound.  Neurological:  Negative for dizziness, extremity weakness, gait problem, headaches, light-headedness, numbness, seizures and speech difficulty.  Hematological:  Negative for adenopathy. Does not bruise/bleed easily.  Psychiatric/Behavioral:  Negative for confusion, decreased concentration, depression, sleep disturbance and suicidal ideas. The patient is not nervous/anxious.      VITALS:  Blood pressure (!) 166/74, pulse 76, temperature 98.1 F (36.7 C), temperature source Oral, resp. rate 18, height $RemoveBe'5\' 10"'GeLEWMsGq$  (1.778 m), weight 180 lb 8 oz (81.9 kg), SpO2 94 %.  Wt Readings from Last 3 Encounters:  05/01/21 180 lb 8 oz (81.9 kg)  03/18/21 185 lb 11.2 oz (84.2 kg)  02/04/21 185 lb 6.4 oz (84.1 kg)    Body mass index is 25.9 kg/m.  Performance status (ECOG): 1 - Symptomatic but completely ambulatory  PHYSICAL EXAM:  Physical Exam Constitutional:      General: He is not in acute distress.    Appearance: Normal appearance. He is normal weight. He is not ill-appearing, toxic-appearing or diaphoretic.  HENT:     Head: Normocephalic and atraumatic.     Nose: Nose normal. No congestion or rhinorrhea.     Mouth/Throat:     Mouth: Mucous membranes are moist.     Pharynx: Oropharynx is clear. No oropharyngeal exudate or posterior oropharyngeal erythema.  Eyes:     General: No scleral icterus.  Right eye: No discharge.        Left eye: No discharge.     Extraocular Movements: Extraocular movements intact.     Conjunctiva/sclera: Conjunctivae normal.     Pupils: Pupils are equal, round, and reactive to light.  Neck:     Vascular: No carotid bruit.  Cardiovascular:     Rate and Rhythm: Normal rate and regular rhythm.     Pulses: Normal pulses.     Heart sounds: Normal heart sounds. No murmur heard.   No friction rub. No gallop.  Pulmonary:     Effort: Pulmonary effort is normal. No respiratory distress.     Breath sounds: Normal breath sounds. No stridor. No wheezing, rhonchi or rales.  Chest:     Chest wall: No tenderness.  Abdominal:     General: Abdomen is flat. Bowel sounds are normal. There is no distension.     Palpations: Abdomen is soft. There is no hepatomegaly, splenomegaly or mass.     Tenderness: There is no abdominal tenderness. There is no right CVA tenderness, left CVA tenderness, guarding or rebound.     Hernia: No hernia is present.   Musculoskeletal:        General: No swelling, tenderness, deformity or signs of injury. Normal range of motion.     Cervical back: Normal range of motion and neck supple. No rigidity or tenderness.     Right lower leg: No edema.     Left lower leg: No edema.  Lymphadenopathy:     Cervical: No cervical adenopathy.  Skin:    General: Skin is warm and dry.     Capillary Refill: Capillary refill takes less than 2 seconds.     Coloration: Skin is not jaundiced or pale.     Findings: No bruising, erythema, lesion or rash.  Neurological:     General: No focal deficit present.     Mental Status: He is alert and oriented to person, place, and time. Mental status is at baseline.     Cranial Nerves: No cranial nerve deficit.     Sensory: No sensory deficit.     Motor: No weakness.     Coordination: Coordination normal.     Gait: Gait normal.     Deep Tendon Reflexes: Reflexes normal.  Psychiatric:        Mood and Affect: Mood normal.        Behavior: Behavior normal.        Thought Content: Thought content normal.        Judgment: Judgment normal.   LABS:   CBC Latest Ref Rng & Units 04/29/2021 03/18/2021 02/04/2021  WBC - 6.3 4.8 5.6  Hemoglobin 13.5 - 17.5 12.9(A) 11.8(A) 11.6(A)  Hematocrit 41 - 53 40(A) 37(A) 37(A)  Platelets 150 - 399 97(A) 87(A) 92(A)   CMP Latest Ref Rng & Units 04/29/2021 03/18/2021 02/04/2021  Glucose 70 - 99 mg/dL - - -  BUN 4 - $R'21 13 9 9  'ei$ Creatinine 0.6 - 1.3 0.6 0.6 0.7  Sodium 137 - 147 135(A) 136(A) 135(A)  Potassium 3.4 - 5.3 5.0 4.2 4.4  Chloride 99 - 108 105 106 106  CO2 13 - 22 23(A) 22 21  Calcium 8.7 - 10.7 9.0 8.5(A) 8.7  Total Protein 6.5 - 8.1 g/dL - - -  Total Bilirubin 0.3 - 1.2 mg/dL - - -  Alkaline Phos 25 - 125 136(A) 126(A) 161(A)  AST 14 - 40 30 31 98(A)  ALT 10 - 40  23 27 191(A)     Lab Results  Component Value Date   CEA1 442.0 (H) 04/29/2021   /  CEA  Date Value Ref Range Status  04/29/2021 442.0 (H) 0.0 - 4.7 ng/mL Final     Comment:    (NOTE)                             Nonsmokers          <3.9                             Smokers             <5.6 [employer] Electrochemiluminescence Immunoassay (ECLIA) Values obtained with different assay methods or kits cannot be used interchangeably.  Results cannot be interpreted as absolute evidence of the presence or absence of malignant disease. Performed At: [facility] [place], [place] [id number] Rush Farmer MD EX:[id number]      STUDIES:   Exam(s): [id number] CT/CT CHEST-ABD-PELV W/IV CM CLINICAL DATA:  History of colon cancer with lung metastases.  EXAM: CT CHEST, ABDOMEN, AND PELVIS WITH CONTRAST  TECHNIQUE: Multidetector CT imaging of the chest, abdomen and pelvis was performed following the standard protocol during bolus administration of intravenous contrast.  CONTRAST:  100 cc Isovue 370  COMPARISON:  01/08/2021  FINDINGS: CT CHEST FINDINGS  Cardiovascular: The heart size appears within normal limits. Aortic atherosclerosis. Coronary artery calcifications. No pericardial effusion.  Mediastinum/Nodes: Index AP window lymph node measures 1.4 cm, image 25/2. Previously 1.4 cm. No new enlarged lymph nodes.  Normal appearance of the thyroid gland. The trachea appears patent and is midline. Normal appearance of the esophagus.  Lungs/Pleura: No pleural effusion. Multiple pulmonary nodules/masses are again noted bilaterally including:  Index nodule within the posterior, subpleural left lower lobe measures 4.6 cm, image 59/4. Previously 4.0 cm.  Index nodule within the subpleural aspect of the right middle lobe measures 4.6 cm, image 67/4. Previously 3.6 cm.  Index nodule within the left upper lobe measures 1.7 cm, image 72/4. Previously 1.8 cm.  Index nodule within the anterolateral right lower lobe measures 1.8 cm, image 83/4. Previously 1.7 cm.  Index nodule within anteromedial left upper lobe nodule  measures 2.6 cm, image 63/4. Previously 2.3 cm.  Musculoskeletal: No chest wall mass or suspicious bone lesions identified.  CT ABDOMEN PELVIS FINDINGS  Hepatobiliary: The liver has a nodular contour and there is recanalization of the umbilical vein. No focal liver lesion. Gallbladder normal. No bile duct dilatation.  Pancreas: Unremarkable. No pancreatic ductal dilatation or surrounding inflammatory changes.  Spleen: Measures 19.1 by 11.9 x 6.3 cm (volume = 750 cm^3). No focal splenic lesion.  Adrenals/Urinary Tract: Normal appearance of the adrenal glands. Asymmetric atrophy of the left kidney. No mass or hydronephrosis. Dome of bladder extends into large right inguinal hernia as noted previously. No focal bladder abnormality..  Stomach/Bowel: Stomach is nondistended. The appendix is visualized and appears normal. Circumferential wall thickening of the distal sigmoid colon and rectum with surrounding soft tissue stranding is again noted. Tumor measures approximately 8.9 cm in length with a maximum thickness of 3.3 cm, image 66/602. Previously 8.7 cm in length with a thickness of 3.4 cm. Mild to moderate stool burden identified within the colon proximal to the tumor.  Vascular/Lymphatic: Aortic atherosclerosis. No aneurysm. No abdominopelvic adenopathy.  Reproductive: Prostate is unremarkable.  Other: Bilateral inguinal  hernias are again noted, right greater than left. The left hernia contains fat only.  No abdominopelvic ascites or focal fluid collections.  Musculoskeletal: No acute or significant osseous findings.  IMPRESSION: 1. Again seen are multiple bilateral pulmonary nodules. Several of the index nodules are mildly increased in size in the interval. Others remain stable. 2. Similar appearance of circumferential wall thickening of the distal sigmoid colon and rectum with surrounding soft tissue stranding. 3. No signs of abdominopelvic adenopathy or solid  organ metastasis within the abdomen. 4. Morphologic features of the liver compatible with cirrhosis. Stigmata of portal venous hypertension including splenomegaly and recanalization of the umbilical vein. 5. Bilateral inguinal hernias, right greater than left. 6. Aortic atherosclerosis and coronary artery calcifications.  Aortic Atherosclerosis (ICD10-I70.0).   Electronically Signed   By: Kerby Moors M.D.   On: 05/01/2021 10:07  Electronically Signed By: Angelita Ingles MD  Electronically Signed Date/Time: 06/09/221010 Dictate Date/Time: 05/01/21 0949  Technologist: Waymon Amato A Transcribed By: Odie Sera Transcribed Date/Time: 05/01/21 1007 Allergies:  Allergies  Allergen Reactions   Lisinopril Hives        Adhesive [Tape] Rash    Bandaids   Aspirin Other (See Comments)    Burns stomach    Neosporin [Neomycin-Bacitracin Zn-Polymyx] Rash    Current Medications: Current Outpatient Medications  Medication Sig Dispense Refill   diphenoxylate-atropine (LOMOTIL) 2.5-0.025 MG tablet TAKE 1 TO 2 TABLETS BY MOUTH 4 TIMES DAILY AS NEEDED FOR DIARRHEA 30 tablet 1   atorvastatin (LIPITOR) 80 MG tablet Take 1 tablet (80 mg total) by mouth daily at 6 PM. 30 tablet 0   clopidogrel (PLAVIX) 75 MG tablet Take 1 tablet (75 mg total) by mouth daily with breakfast. 30 tablet 0   Diphenoxylate-Atropine (LOMOTIL PO) Take by mouth as needed.     ezetimibe (ZETIA) 10 MG tablet TAKE 1 TABLET BY MOUTH EVERY DAY 90 tablet 3   famotidine (PEPCID) 20 MG tablet Take 1 tablet (20 mg total) by mouth as needed for heartburn or indigestion. 30 tablet 0   ferrous sulfate 325 (65 FE) MG tablet Take 1 tablet (325 mg total) by mouth daily. 30 tablet 3   gabapentin (NEURONTIN) 300 MG capsule Take 300 mg by mouth at bedtime.     hydrALAZINE (APRESOLINE) 25 MG tablet TAKE 1 TABLET BY MOUTH THREE TIMES A DAY 270 tablet 1   isosorbide mononitrate (IMDUR) 30 MG 24 hr tablet Take 1 tablet (30 mg total) by mouth  daily. 30 tablet 0   metFORMIN (GLUCOPHAGE-XR) 500 MG 24 hr tablet Take 500 mg by mouth in the morning and at bedtime.      metoprolol succinate (TOPROL-XL) 100 MG 24 hr tablet Take 100 mg by mouth daily. Take with or immediately following a meal.     minoxidil (LONITEN) 2.5 MG tablet Take 2 tablets (5 mg total) by mouth 2 (two) times daily. 360 tablet 3   nitroGLYCERIN (NITROSTAT) 0.4 MG SL tablet PLACE 1 TABLET (0.4 MG TOTAL) UNDER THE TONGUE EVERY 5 (FIVE) MINUTES AS NEEDED FOR CHEST PAIN. 75 tablet 1   pantoprazole (PROTONIX) 40 MG tablet TAKE 1 TABLET BY MOUTH EVERY DAY (Patient taking differently: as needed.) 90 tablet 1   polyethylene glycol (MIRALAX / GLYCOLAX) 17 g packet Take 17 g by mouth daily. 14 each 0   Potassium Chloride ER 20 MEQ TBCR TAKE 1 TABLET BY MOUTH 4 TIMES A DAY 360 tablet 3   prochlorperazine (COMPAZINE) 10 MG tablet Take 1 tablet (10 mg total)  by mouth every 6 (six) hours as needed. 60 tablet 5   vitamin C (ASCORBIC ACID) 500 MG tablet Take 500 mg by mouth. ON OCCASSIONS     No current facility-administered medications for this visit.     ASSESSMENT & PLAN:   Assessment:   1. Metastatic colorectal cancer to the lung.  He had mild progression of disease on palliative infusional 5 fluorouracil/leucovorin/bevacizumab every 3 weeks, so was switched back to infusional 5 fluorouracil/leucovorin/bevacizumab every 2 weeks.  He had minimal progression of disease from February to July 2020, so we continued the same regimen.  Bevacizumab was discontinued in May 2020 due to rectal bleeding.  CT imaging in July 2020 revealed progressive disease both in the lungs and thickening of the rectum, so chemotherapy was discontinued and the patient was placed on observation.  He has had continued slow progression of his disease on imaging, as well as an increasing CEA.  CT imaging in October 2021 revealed progressive pulmonary metastasis and a new 11 mm left mediastinal lymph node.  The  patient at that time desired to continue observation, but was willing to get back on treatment if his disease significantly worsened.  He completed palliative radiation on January 21st.  CT imaging from February has shown interval progression of disease, and so we did ecommend chemotherapy.  His main option would be FOLFIRI but we could reduce doses due to his age and cytopenias. CT from yesterday remains fairly stable, yet CEA continues to rise and is 442 this visit. He wishes to hold off on therapy for now as he is physically doing well.   2.  Rectal cancer which has not been resected due to the metastatic disease.  He had a hospitalization due to significant GI bleed in November 2021.  His hemoglobin dropped down to 6 at that time, and he received 2 units of PRBCs.  Sigmoidoscopy confirmed a 5 cm fungating and ulcerated non-obstructing mass in the rectum and biopsy was consistent with adenocarcinoma.  Therefore, he has received palliative radiation.   3. Thrombocytopenia, secondary to chemotherapy and splenomegaly, which is fairly stable.  6. Splenomegaly and liver cirrhosis on CT imaging, which is likely contributing to his thrombocytopenia.  Physical exam is benign   Plan: He continues to do well and wishes to remain off of therapy. His liver enzymes have normalized. We will continue to monitor his platelets. We will see him back in 3 months for repeat CBC, CMP, CEA and evaluation.   He verbalizes understanding of and agreement to the plans discussed today. He knows to call the office should any new questions or concerns arise.    [name], NP [facility] [address] [place] [address] Dept: [phone] Dept Fax: [phone]

## 2021-05-01 NOTE — Telephone Encounter (Signed)
Per 6/9 LOS, patient scheduled for Sept Appt's.  Gave patient Appt Summary

## 2021-05-08 ENCOUNTER — Ambulatory Visit (INDEPENDENT_AMBULATORY_CARE_PROVIDER_SITE_OTHER): Payer: Medicare Other | Admitting: Cardiology

## 2021-05-08 ENCOUNTER — Encounter: Payer: Self-pay | Admitting: Cardiology

## 2021-05-08 ENCOUNTER — Other Ambulatory Visit: Payer: Self-pay

## 2021-05-08 VITALS — BP 138/60 | HR 76 | Ht 70.6 in | Wt 181.2 lb

## 2021-05-08 DIAGNOSIS — E785 Hyperlipidemia, unspecified: Secondary | ICD-10-CM | POA: Diagnosis not present

## 2021-05-08 DIAGNOSIS — I119 Hypertensive heart disease without heart failure: Secondary | ICD-10-CM

## 2021-05-08 DIAGNOSIS — I2510 Atherosclerotic heart disease of native coronary artery without angina pectoris: Secondary | ICD-10-CM

## 2021-05-08 DIAGNOSIS — Z85048 Personal history of other malignant neoplasm of rectum, rectosigmoid junction, and anus: Secondary | ICD-10-CM

## 2021-05-08 MED ORDER — PRAVASTATIN SODIUM 20 MG PO TABS: 20.0000 mg | ORAL_TABLET | Freq: Every evening | ORAL | 3 refills | Status: AC

## 2021-05-08 NOTE — Progress Notes (Signed)
Cardiology Office Note:    Date:  05/08/2021   ID:  Dakota Bennett, DOB 1945-11-27, MRN [id number]  PCP:  Serita Grammes, MD  Cardiologist:  Shirlee More, MD    Referring MD: Serita Grammes, MD    ASSESSMENT:    1. CAD in native artery   2. Hypertensive heart disease, unspecified whether heart failure present   3. Hyperlipidemia LDL goal <70   4. History of rectal cancer    PLAN:    In order of problems listed above:  Overall is doing well has a good quality of life and no longer is being actively treated for his colorectal cancer.  He is not on clopidogrel with GI bleeding cirrhosis and thrombocytopenia.  I would continue active medical treatment cardiac including nitroglycerin his beta-blocker restart a low intensity statin and would not place back on antiplatelet therapy or do an ischemia evaluation at this time. Stable BP at target continue treatment beta-blocker Resume statin low-dose of low intensity pravastatin generally much better tolerated and high intensity statins Stable at this point time no active treatment Left bundle branch block EKG   Next appointment: 1 year   Medication Adjustments/Labs and Tests Ordered: Current medicines are reviewed at length with the patient today.  Concerns regarding medicines are outlined above.  No orders of the defined types were placed in this encounter.  Meds ordered this encounter  Medications   pravastatin (PRAVACHOL) 20 MG tablet    Sig: Take 1 tablet (20 mg total) by mouth every evening.    Dispense:  90 tablet    Refill:  3    Chief Complaint  Patient presents with   Follow-up   Coronary Artery Disease    History of Present Illness:    Dakota Bennett is a 75 y.o. male with a hx of CAD with non-ST elevation MI and PCI and stent right coronary artery 81/11/7508 complicated by dissection, hypertensive heart disease hyperlipidemia and colorectal cancer.  He was last seen 11/14/2020.  He is followed by the  cancer program with his metastatic colorectal cancer and is no longer being treated actively with chemotherapy he also has splenomegaly and cirrhosis on CT imaging and thrombocytopenia.  He is no longer on clopidogrel and statins were stopped when he had abnormal liver function.  He rarely has anginal discomfort relieved with nitroglycerin he is an active man is given right up with pool and this weekend no shortness of breath edema palpitation or syncope. Compliance with diet, lifestyle and medications: Yes Past Medical History:  Diagnosis Date   Acute lower GI bleeding 10/15/2020   Allergic rhinitis    CAD in native artery 03/02/2019   Cancer ([place])    rectal cancer and found spots on his lungs also and is on chemo currently    Diabetes mellitus type 2, noninsulin dependent ([place]) 02/24/2019   Diabetes mellitus without complication ([place])    type 2   Edema 04/20/2019   He is on a CCB and gabapentin normal pro BNP   Essential hypertension 09/29/2017   GERD (gastroesophageal reflux disease)    Headache    hx of migraines yrs ago   Hemorrhoids    History of rectal cancer    Hyperlipidemia    Hyperlipidemia LDL goal <70    Hypertension    Hypertensive heart disease 05/24/2019   Hyponatremia    Iron deficiency anemia secondary to blood loss (chronic) 10/15/2020   Localized edema 05/25/2019   Malignant neoplasm of rectum ([place]) 03/29/2017  MVP (mitral valve prolapse)    dx 40 yrs no cardiologist   NSTEMI (non-ST elevated myocardial infarction) ([place]) 02/24/2019   02/27/2019 troponin peaked 0.25 he had PCI and stent to the right coronary artery complicated with an acute dissection requiring 3 drug-eluting stents for control he had residual proximal LAD disease 70% first marginal 50% of recommendation to treat medically unless he has refractory angina as he would require atherectomy for the proximal LAD lesion.  Also of note is that his ventricular function    Rectal bleeding    Status post coronary artery  stent placement    Thrombocytopenia [facility])     Past Surgical History:  Procedure Laterality Date   BIOPSY  10/17/2020   Procedure: BIOPSY;  Surgeon: Lavena Bullion, DO;  Location: [facility];  Service: Gastroenterology;;   COLONOSCOPY  03/03/2017   Proximal rectal mass (biopsied). Transverse colonic polyps status post polypectomy. Mild sigmoid diverticulosis   CORONARY STENT INTERVENTION N/A 02/27/2019   Procedure: CORONARY STENT INTERVENTION;  Surgeon: Martinique, Peter M, MD;  Location: [facility];  Service: Cardiovascular;  Laterality: N/A;   EUS N/A 03/18/2017   Procedure: LOWER ENDOSCOPIC ULTRASOUND (EUS);  Surgeon: Milus Banister, MD;  Location: [facility];  Service: Endoscopy;  Laterality: N/A;   extensive dental work     FLEXIBLE SIGMOIDOSCOPY N/A 02/21/2019   Procedure: FLEXIBLE SIGMOIDOSCOPY;  Surgeon: Jackquline Denmark, MD;  Location: [facility];  Service: Endoscopy;  Laterality: N/A;   FLEXIBLE SIGMOIDOSCOPY N/A 10/17/2020   Procedure: FLEXIBLE SIGMOIDOSCOPY;  Surgeon: Lavena Bullion, DO;  Location: [facility];  Service: Gastroenterology;  Laterality: N/A;   HEMOSTASIS CLIP PLACEMENT  10/17/2020   Procedure: HEMOSTASIS CLIP PLACEMENT;  Surgeon: Lavena Bullion, DO;  Location: [facility];  Service: Gastroenterology;;   ingrown toenail removed  age 4 or 49   LEFT HEART CATH AND CORONARY ANGIOGRAPHY N/A 02/27/2019   Procedure: LEFT HEART CATH AND CORONARY ANGIOGRAPHY;  Surgeon: Martinique, Peter M, MD;  Location: [facility];  Service: Cardiovascular;  Laterality: N/A;    Current Medications: Current Meds  Medication Sig   diphenoxylate-atropine (LOMOTIL) 2.5-0.025 MG tablet Take 1-2 tablets by mouth 4 (four) times daily as needed for diarrhea or loose stools.   doxycycline (VIBRA-TABS) 100 MG tablet Take 100 mg by mouth as needed (rash).   furosemide (LASIX) 20 MG tablet Take 20 mg by mouth as needed for fluid or edema.   metoprolol succinate (TOPROL-XL)  100 MG 24 hr tablet Take 100 mg by mouth 2 (two) times daily. Take with or immediately following a meal.   nitroGLYCERIN (NITROSTAT) 0.4 MG SL tablet Place 0.4 mg under the tongue every 5 (five) minutes as needed for chest pain.   omeprazole (PRILOSEC) 20 MG capsule Take 20 mg by mouth daily.   pravastatin (PRAVACHOL) 20 MG tablet Take 1 tablet (20 mg total) by mouth every evening.   spironolactone (ALDACTONE) 25 MG tablet Take 25 mg by mouth daily.     Allergies:   Lisinopril, Adhesive [tape], Aspirin, and Neosporin [neomycin-bacitracin zn-polymyx]   Social History   Socioeconomic History   Marital status: Married    Spouse name: Not on file   Number of children: 3   Years of education: Not on file   Highest education level: Not on file  Occupational History   Not on file  Tobacco Use   Smoking status: Former    Packs/day: 2.00    Years: 20.00    Pack years:  40.00    Types: Cigarettes   Smokeless tobacco: Former    Quit date: 11/23/1998   Tobacco comments:    quit 2000  Vaping Use   Vaping Use: Never used  Substance and Sexual Activity   Alcohol use: No   Drug use: No   Sexual activity: Not on file  Other Topics Concern   Not on file  Social History Narrative   Not on file   Social Determinants of Health   Financial Resource Strain: Not on file  Food Insecurity: Not on file  Transportation Needs: Not on file  Physical Activity: Not on file  Stress: Not on file  Social Connections: Not on file     Family History: The patient's family history includes Heart attack in his father; Prostate cancer in his father. There is no history of Colon cancer or Esophageal cancer. ROS:   Please see the history of present illness.    All other systems reviewed and are negative.  EKGs/Labs/Other Studies Reviewed:    The following studies were reviewed today:  EKG:  EKG last visit 11/14/2020 showed sinus rhythm left bundle branch block  Recent Labs: 10/16/2020: Magnesium  1.7 04/29/2021: ALT 23; BUN 13; Creatinine 0.6; Hemoglobin 12.9; Platelets 97; Potassium 5.0; Sodium 135  Recent Lipid Panel    Component Value Date/Time   CHOL 150 02/26/2019 0348   TRIG 88 02/26/2019 0348   HDL 35 (L) 02/26/2019 0348   CHOLHDL 4.3 02/26/2019 0348   VLDL 18 02/26/2019 0348   LDLCALC 97 02/26/2019 0348    Physical Exam:    VS:  BP 138/60   Pulse 76   Ht 5' 10.6" (1.793 m)   Wt 181 lb 3.2 oz (82.2 kg)   SpO2 93%   BMI 25.56 kg/m     Wt Readings from Last 3 Encounters:  05/08/21 181 lb 3.2 oz (82.2 kg)  05/01/21 180 lb 8 oz (81.9 kg)  03/18/21 185 lb 11.2 oz (84.2 kg)     GEN:  Well nourished, well developed in no acute distress HEENT: Normal NECK: No JVD; No carotid bruits LYMPHATICS: No lymphadenopathy CARDIAC: RRR, no murmurs, rubs, gallops RESPIRATORY:  Clear to auscultation without rales, wheezing or rhonchi  ABDOMEN: Soft, non-tender, non-distended MUSCULOSKELETAL:  No edema; No deformity  SKIN: Warm and dry NEUROLOGIC:  Alert and oriented x 3 PSYCHIATRIC:  Normal affect    Signed, Shirlee More, MD  05/08/2021 2:18 PM    [facility]

## 2021-05-08 NOTE — Patient Instructions (Signed)
Medication Instructions:  Your physician has recommended you make the following change in your medication:   Start Pravastatin 20 mg daily.  *If you need a refill on your cardiac medications before your next appointment, please call your pharmacy*   Lab Work: None ordered If you have labs (blood work) drawn today and your tests are completely normal, you will receive your results only by: Jeff (if you have MyChart) OR A paper copy in the mail If you have any lab test that is abnormal or we need to change your treatment, we will call you to review the results.   Testing/Procedures: None ordered   Follow-Up: At [facility], you and your health needs are our priority.  As part of our continuing mission to provide you with exceptional heart care, we have created designated Provider Care Teams.  These Care Teams include your primary Cardiologist (physician) and Advanced Practice Providers (APPs -  Physician Assistants and Nurse Practitioners) who all work together to provide you with the care you need, when you need it.  We recommend signing up for the patient portal called "MyChart".  Sign up information is provided on this After Visit Summary.  MyChart is used to connect with patients for Virtual Visits (Telemedicine).  Patients are able to view lab/test results, encounter notes, upcoming appointments, etc.  Non-urgent messages can be sent to your provider as well.   To learn more about what you can do with MyChart, go to [email or web address].    Your next appointment:   12 month(s)  The format for your next appointment:   In Person  Provider:   Shirlee More, MD   Other Instructions Pravastatin Tablets What is this medication? PRAVASTATIN (PRA va sta tin) treats high cholesterol and reduces the risk of heart attack and stroke. It works by decreasing bad cholesterol and fats (such as LDL, triglycerides), and increasing good cholesterol (HDL) in your blood. It  belongs to a group of medications called statins. Changes to diet and exerciseare often combined with this medication. This medicine may be used for other purposes; ask your health care provider orpharmacist if you have questions. COMMON BRAND NAME(S): Pravachol What should I tell my care team before I take this medication? They need to know if you have any of these conditions: Diabetes (high blood sugar) If you often drink alcohol Kidney disease Liver disease Muscle cramps, pain Stroke Thyroid disease An unusual or allergic reaction to pravastatin, other medications, foods, dyes, or preservatives Pregnant or trying to get pregnant Breast-feeding How should I use this medication? Take this medication by mouth. Take it as directed on the prescription label at the same time every day. You can take it with or without food. If it upsets your stomach, take it with food. Keep taking it unless your care team tells youto stop. Talk to your care team about the use of this medication in children. While it may be prescribed for children as young as 8 for selected conditions,precautions do apply. Overdosage: If you think you have taken too much of this medicine contact apoison control center or emergency room at once. NOTE: This medicine is only for you. Do not share this medicine with others. What if I miss a dose? If you miss a dose, take it as soon as you can. If it is almost time for yournext dose, take only that dose. Do not take double or extra doses. What may interact with this medication? This medication may interact with the following: Colchicine  Cyclosporine Other medications for high cholesterol Some antibiotics like azithromycin, clarithromycin, erythromycin, and telithromycin This list may not describe all possible interactions. Give your health care provider a list of all the medicines, herbs, non-prescription drugs, or dietary supplements you use. Also tell them if you smoke, drink  alcohol, or use illegaldrugs. Some items may interact with your medicine. What should I watch for while using this medication? Visit your health care provider for regular checks on your progress. Tell your health care provider if your symptoms do not start to get better or if they getworse. Your health care provider may tell you to stop taking this medication if you develop muscle problems. If your muscle problems do not go away after stoppingthis medication, contact your health care provider. Do not become pregnant while taking this medication. Women should inform their health care provider if they wish to become pregnant or think they might be pregnant. There is potential for serious harm to an unborn child. Talk to your health care provider for more information. Do not breast-feed an infant whiletaking this medication. This medication may increase blood sugar. Ask your health care provider ifchanges in diet or medications are needed if you have diabetes. If you are going to need surgery or other procedure, tell your health careprovider that you are using this medication. Taking this medication is only part of a total heart healthy program. Your health care provider may give you a special diet to follow. Avoid alcohol.Avoid smoking. Ask your health care provider how much you should exercise. What side effects may I notice from receiving this medication? Side effects that you should report to your care team as soon as possible: Allergic reactions-skin rash, itching, hives, swelling of the face, lips, tongue, or throat High blood sugar (hyperglycemia)-increased thirst or amount of urine, unusual weakness, fatigue, blurry vision Liver injury-right upper belly pain, loss of appetite, nausea, light-colored stool, dark yellow or brown urine, yellowing skin or eyes, unusual weakness, fatigue Muscle injury-unusual weakness, fatigue, muscle pain, dark yellow or brown urine, decrease in amount of  urine Redness, blistering, peeling or loosening of the skin, including inside the mouth Side effects that usually do not require medical attention (report to your careteam if they continue or are bothersome): Diarrhea Headache Nausea Vomiting This list may not describe all possible side effects. Call your doctor for medical advice about side effects. You may report side effects to FDA at1-800-FDA-1088. Where should I keep my medication? Keep out of the reach of children and pets. Store at room temperature between 15 and 30 degrees C (59 and 86 degrees F). Protect from light and moisture. Keep the container tightly closed. Get rid ofany unused medication after the expiration date. To get rid of medications that are no longer needed or expired: Take the medication to a medication take-back program. Check with your pharmacy or law enforcement to find a location. If you cannot return the medication, check the label or package insert to see if the medication should be thrown out in the garbage or flushed down the toilet. If you are not sure, ask your care team. If it is safe to put in the trash, empty the medication out of the container. Mix the medication with cat litter, dirt, coffee grounds, or other unwanted substance. Seal the mixture in a bag or container. Put it in the trash. NOTE: This sheet is a summary. It may not cover all possible information. If you have questions about this medicine, talk to your doctor,  pharmacist, orhealth care provider.  2022 Elsevier/Gold Standard (2020-12-06 12:27:05)

## 2021-05-17 ENCOUNTER — Other Ambulatory Visit: Payer: Self-pay | Admitting: Oncology

## 2021-05-17 DIAGNOSIS — C218 Malignant neoplasm of overlapping sites of rectum, anus and anal canal: Secondary | ICD-10-CM

## 2021-06-16 ENCOUNTER — Telehealth: Payer: Self-pay

## 2021-06-16 DIAGNOSIS — D630 Anemia in neoplastic disease: Secondary | ICD-10-CM | POA: Diagnosis present

## 2021-06-16 DIAGNOSIS — I2510 Atherosclerotic heart disease of native coronary artery without angina pectoris: Secondary | ICD-10-CM | POA: Diagnosis present

## 2021-06-16 DIAGNOSIS — W19XXXA Unspecified fall, initial encounter: Secondary | ICD-10-CM | POA: Diagnosis not present

## 2021-06-16 DIAGNOSIS — Z79891 Long term (current) use of opiate analgesic: Secondary | ICD-10-CM | POA: Diagnosis not present

## 2021-06-16 DIAGNOSIS — Z79899 Other long term (current) drug therapy: Secondary | ICD-10-CM | POA: Diagnosis not present

## 2021-06-16 DIAGNOSIS — D696 Thrombocytopenia, unspecified: Secondary | ICD-10-CM | POA: Diagnosis present

## 2021-06-16 DIAGNOSIS — C20 Malignant neoplasm of rectum: Secondary | ICD-10-CM | POA: Diagnosis not present

## 2021-06-16 DIAGNOSIS — Z7189 Other specified counseling: Secondary | ICD-10-CM | POA: Diagnosis not present

## 2021-06-16 DIAGNOSIS — E871 Hypo-osmolality and hyponatremia: Secondary | ICD-10-CM | POA: Diagnosis present

## 2021-06-16 DIAGNOSIS — I252 Old myocardial infarction: Secondary | ICD-10-CM | POA: Diagnosis not present

## 2021-06-16 DIAGNOSIS — Z85118 Personal history of other malignant neoplasm of bronchus and lung: Secondary | ICD-10-CM | POA: Diagnosis not present

## 2021-06-16 DIAGNOSIS — K6289 Other specified diseases of anus and rectum: Secondary | ICD-10-CM | POA: Diagnosis not present

## 2021-06-16 DIAGNOSIS — Z85038 Personal history of other malignant neoplasm of large intestine: Secondary | ICD-10-CM | POA: Diagnosis not present

## 2021-06-16 DIAGNOSIS — G893 Neoplasm related pain (acute) (chronic): Secondary | ICD-10-CM | POA: Diagnosis present

## 2021-06-16 DIAGNOSIS — C218 Malignant neoplasm of overlapping sites of rectum, anus and anal canal: Secondary | ICD-10-CM | POA: Diagnosis not present

## 2021-06-16 DIAGNOSIS — I2699 Other pulmonary embolism without acute cor pulmonale: Secondary | ICD-10-CM | POA: Diagnosis not present

## 2021-06-16 DIAGNOSIS — M545 Low back pain, unspecified: Secondary | ICD-10-CM | POA: Diagnosis not present

## 2021-06-16 DIAGNOSIS — M549 Dorsalgia, unspecified: Secondary | ICD-10-CM | POA: Diagnosis not present

## 2021-06-16 DIAGNOSIS — I517 Cardiomegaly: Secondary | ICD-10-CM | POA: Diagnosis not present

## 2021-06-16 DIAGNOSIS — I10 Essential (primary) hypertension: Secondary | ICD-10-CM | POA: Diagnosis present

## 2021-06-16 DIAGNOSIS — C7801 Secondary malignant neoplasm of right lung: Secondary | ICD-10-CM | POA: Diagnosis not present

## 2021-06-16 DIAGNOSIS — C720 Malignant neoplasm of spinal cord: Secondary | ICD-10-CM | POA: Diagnosis not present

## 2021-06-16 DIAGNOSIS — R918 Other nonspecific abnormal finding of lung field: Secondary | ICD-10-CM | POA: Diagnosis not present

## 2021-06-16 DIAGNOSIS — K4090 Unilateral inguinal hernia, without obstruction or gangrene, not specified as recurrent: Secondary | ICD-10-CM | POA: Diagnosis not present

## 2021-06-16 DIAGNOSIS — D497 Neoplasm of unspecified behavior of endocrine glands and other parts of nervous system: Secondary | ICD-10-CM | POA: Diagnosis not present

## 2021-06-16 DIAGNOSIS — R0602 Shortness of breath: Secondary | ICD-10-CM | POA: Diagnosis not present

## 2021-06-16 DIAGNOSIS — E785 Hyperlipidemia, unspecified: Secondary | ICD-10-CM | POA: Diagnosis present

## 2021-06-16 DIAGNOSIS — Z955 Presence of coronary angioplasty implant and graft: Secondary | ICD-10-CM | POA: Diagnosis not present

## 2021-06-16 DIAGNOSIS — E119 Type 2 diabetes mellitus without complications: Secondary | ICD-10-CM | POA: Diagnosis present

## 2021-06-16 DIAGNOSIS — C7949 Secondary malignant neoplasm of other parts of nervous system: Secondary | ICD-10-CM | POA: Diagnosis not present

## 2021-06-16 DIAGNOSIS — Z87891 Personal history of nicotine dependence: Secondary | ICD-10-CM | POA: Diagnosis not present

## 2021-06-16 DIAGNOSIS — R5383 Other fatigue: Secondary | ICD-10-CM | POA: Diagnosis not present

## 2021-06-16 DIAGNOSIS — R059 Cough, unspecified: Secondary | ICD-10-CM | POA: Diagnosis not present

## 2021-06-16 NOTE — Telephone Encounter (Signed)
DAUGHTER (TONYA) CALLED STATING THAT PT FELL Saturday NIGHT AND REFUSED TO GO TO ED.  WANTED TO WAIT AND TALK TO DR. MCCARTY.  IS RATING PAIN 10/10.  IS HAVING SOB THIS AM.  I ADVISED DAUGHTER TO CALL 911 AND HAVE PT EVALUATED IN ED.  DR. Hinton Rao IS AWARE,

## 2021-06-17 ENCOUNTER — Telehealth: Payer: Self-pay

## 2021-06-17 DIAGNOSIS — C20 Malignant neoplasm of rectum: Secondary | ICD-10-CM | POA: Diagnosis not present

## 2021-06-17 DIAGNOSIS — C218 Malignant neoplasm of overlapping sites of rectum, anus and anal canal: Secondary | ICD-10-CM | POA: Diagnosis not present

## 2021-06-17 DIAGNOSIS — C7949 Secondary malignant neoplasm of other parts of nervous system: Secondary | ICD-10-CM | POA: Diagnosis not present

## 2021-06-17 NOTE — Telephone Encounter (Signed)
-----   Message from Derwood Kaplan, MD sent at 06/16/2021  2:39 PM EDT ----- Regarding: appt Went to ER after fall, noted SOB, back pain CXR shows numerous lung mets, CT abd/pelvis pending If admitted, I will see there.  If not needs appt this week with me (not after 1:30 on Tues)

## 2021-06-17 NOTE — Telephone Encounter (Signed)
Patient was admitted inpatient at [facility].

## 2021-06-18 DIAGNOSIS — C20 Malignant neoplasm of rectum: Secondary | ICD-10-CM | POA: Diagnosis not present

## 2021-06-18 DIAGNOSIS — C7949 Secondary malignant neoplasm of other parts of nervous system: Secondary | ICD-10-CM | POA: Diagnosis not present

## 2021-06-18 DIAGNOSIS — C218 Malignant neoplasm of overlapping sites of rectum, anus and anal canal: Secondary | ICD-10-CM | POA: Diagnosis not present

## 2021-06-19 DIAGNOSIS — C20 Malignant neoplasm of rectum: Secondary | ICD-10-CM | POA: Diagnosis not present

## 2021-06-19 DIAGNOSIS — C218 Malignant neoplasm of overlapping sites of rectum, anus and anal canal: Secondary | ICD-10-CM | POA: Diagnosis not present

## 2021-06-19 DIAGNOSIS — C7949 Secondary malignant neoplasm of other parts of nervous system: Secondary | ICD-10-CM | POA: Diagnosis not present

## 2021-06-20 DIAGNOSIS — C218 Malignant neoplasm of overlapping sites of rectum, anus and anal canal: Secondary | ICD-10-CM | POA: Diagnosis not present

## 2021-06-20 DIAGNOSIS — Z510 Encounter for antineoplastic radiation therapy: Secondary | ICD-10-CM | POA: Diagnosis not present

## 2021-06-20 DIAGNOSIS — C7800 Secondary malignant neoplasm of unspecified lung: Secondary | ICD-10-CM | POA: Diagnosis not present

## 2021-06-20 DIAGNOSIS — C7949 Secondary malignant neoplasm of other parts of nervous system: Secondary | ICD-10-CM | POA: Diagnosis not present

## 2021-06-23 DIAGNOSIS — C7949 Secondary malignant neoplasm of other parts of nervous system: Secondary | ICD-10-CM | POA: Diagnosis not present

## 2021-06-23 DIAGNOSIS — C7800 Secondary malignant neoplasm of unspecified lung: Secondary | ICD-10-CM | POA: Diagnosis not present

## 2021-06-23 DIAGNOSIS — C218 Malignant neoplasm of overlapping sites of rectum, anus and anal canal: Secondary | ICD-10-CM | POA: Diagnosis not present

## 2021-06-23 DIAGNOSIS — Z510 Encounter for antineoplastic radiation therapy: Secondary | ICD-10-CM | POA: Diagnosis not present

## 2021-06-24 DIAGNOSIS — C7949 Secondary malignant neoplasm of other parts of nervous system: Secondary | ICD-10-CM | POA: Diagnosis not present

## 2021-06-24 DIAGNOSIS — C7800 Secondary malignant neoplasm of unspecified lung: Secondary | ICD-10-CM | POA: Diagnosis not present

## 2021-06-24 DIAGNOSIS — Z510 Encounter for antineoplastic radiation therapy: Secondary | ICD-10-CM | POA: Diagnosis not present

## 2021-06-24 DIAGNOSIS — C218 Malignant neoplasm of overlapping sites of rectum, anus and anal canal: Secondary | ICD-10-CM | POA: Diagnosis not present

## 2021-06-25 DIAGNOSIS — C7949 Secondary malignant neoplasm of other parts of nervous system: Secondary | ICD-10-CM | POA: Diagnosis not present

## 2021-06-25 DIAGNOSIS — C218 Malignant neoplasm of overlapping sites of rectum, anus and anal canal: Secondary | ICD-10-CM | POA: Diagnosis not present

## 2021-06-25 DIAGNOSIS — Z510 Encounter for antineoplastic radiation therapy: Secondary | ICD-10-CM | POA: Diagnosis not present

## 2021-06-25 DIAGNOSIS — C7800 Secondary malignant neoplasm of unspecified lung: Secondary | ICD-10-CM | POA: Diagnosis not present

## 2021-06-26 DIAGNOSIS — Z510 Encounter for antineoplastic radiation therapy: Secondary | ICD-10-CM | POA: Diagnosis not present

## 2021-06-26 DIAGNOSIS — C7949 Secondary malignant neoplasm of other parts of nervous system: Secondary | ICD-10-CM | POA: Diagnosis not present

## 2021-06-26 DIAGNOSIS — C20 Malignant neoplasm of rectum: Secondary | ICD-10-CM | POA: Diagnosis not present

## 2021-06-26 DIAGNOSIS — C7800 Secondary malignant neoplasm of unspecified lung: Secondary | ICD-10-CM | POA: Diagnosis not present

## 2021-06-26 DIAGNOSIS — C218 Malignant neoplasm of overlapping sites of rectum, anus and anal canal: Secondary | ICD-10-CM | POA: Diagnosis not present

## 2021-06-26 DIAGNOSIS — Z7689 Persons encountering health services in other specified circumstances: Secondary | ICD-10-CM | POA: Diagnosis not present

## 2021-06-27 DIAGNOSIS — C218 Malignant neoplasm of overlapping sites of rectum, anus and anal canal: Secondary | ICD-10-CM | POA: Diagnosis not present

## 2021-06-27 DIAGNOSIS — C7949 Secondary malignant neoplasm of other parts of nervous system: Secondary | ICD-10-CM | POA: Diagnosis not present

## 2021-06-27 DIAGNOSIS — Z510 Encounter for antineoplastic radiation therapy: Secondary | ICD-10-CM | POA: Diagnosis not present

## 2021-06-27 DIAGNOSIS — C7800 Secondary malignant neoplasm of unspecified lung: Secondary | ICD-10-CM | POA: Diagnosis not present

## 2021-06-30 DIAGNOSIS — C7800 Secondary malignant neoplasm of unspecified lung: Secondary | ICD-10-CM | POA: Diagnosis not present

## 2021-06-30 DIAGNOSIS — Z510 Encounter for antineoplastic radiation therapy: Secondary | ICD-10-CM | POA: Diagnosis not present

## 2021-06-30 DIAGNOSIS — C7949 Secondary malignant neoplasm of other parts of nervous system: Secondary | ICD-10-CM | POA: Diagnosis not present

## 2021-06-30 DIAGNOSIS — C218 Malignant neoplasm of overlapping sites of rectum, anus and anal canal: Secondary | ICD-10-CM | POA: Diagnosis not present

## 2021-07-01 DIAGNOSIS — Z510 Encounter for antineoplastic radiation therapy: Secondary | ICD-10-CM | POA: Diagnosis not present

## 2021-07-01 DIAGNOSIS — C7949 Secondary malignant neoplasm of other parts of nervous system: Secondary | ICD-10-CM | POA: Diagnosis not present

## 2021-07-01 DIAGNOSIS — C7800 Secondary malignant neoplasm of unspecified lung: Secondary | ICD-10-CM | POA: Diagnosis not present

## 2021-07-01 DIAGNOSIS — C218 Malignant neoplasm of overlapping sites of rectum, anus and anal canal: Secondary | ICD-10-CM | POA: Diagnosis not present

## 2021-07-02 ENCOUNTER — Other Ambulatory Visit: Payer: Self-pay | Admitting: Hematology and Oncology

## 2021-07-02 DIAGNOSIS — C7800 Secondary malignant neoplasm of unspecified lung: Secondary | ICD-10-CM | POA: Diagnosis not present

## 2021-07-02 DIAGNOSIS — Z510 Encounter for antineoplastic radiation therapy: Secondary | ICD-10-CM | POA: Diagnosis not present

## 2021-07-02 DIAGNOSIS — C218 Malignant neoplasm of overlapping sites of rectum, anus and anal canal: Secondary | ICD-10-CM | POA: Diagnosis not present

## 2021-07-02 DIAGNOSIS — C7949 Secondary malignant neoplasm of other parts of nervous system: Secondary | ICD-10-CM | POA: Diagnosis not present

## 2021-07-02 MED ORDER — OXYCODONE HCL 5 MG PO TABS: 5.0000 mg | ORAL_TABLET | ORAL | 0 refills | Status: DC | PRN

## 2021-07-03 DIAGNOSIS — C7800 Secondary malignant neoplasm of unspecified lung: Secondary | ICD-10-CM | POA: Diagnosis not present

## 2021-07-03 DIAGNOSIS — Z510 Encounter for antineoplastic radiation therapy: Secondary | ICD-10-CM | POA: Diagnosis not present

## 2021-07-03 DIAGNOSIS — C218 Malignant neoplasm of overlapping sites of rectum, anus and anal canal: Secondary | ICD-10-CM | POA: Diagnosis not present

## 2021-07-03 DIAGNOSIS — C7949 Secondary malignant neoplasm of other parts of nervous system: Secondary | ICD-10-CM | POA: Diagnosis not present

## 2021-07-04 DIAGNOSIS — C7949 Secondary malignant neoplasm of other parts of nervous system: Secondary | ICD-10-CM | POA: Diagnosis not present

## 2021-07-04 DIAGNOSIS — C7800 Secondary malignant neoplasm of unspecified lung: Secondary | ICD-10-CM | POA: Diagnosis not present

## 2021-07-04 DIAGNOSIS — C218 Malignant neoplasm of overlapping sites of rectum, anus and anal canal: Secondary | ICD-10-CM | POA: Diagnosis not present

## 2021-07-04 DIAGNOSIS — Z510 Encounter for antineoplastic radiation therapy: Secondary | ICD-10-CM | POA: Diagnosis not present

## 2021-07-07 DIAGNOSIS — C7800 Secondary malignant neoplasm of unspecified lung: Secondary | ICD-10-CM | POA: Diagnosis not present

## 2021-07-07 DIAGNOSIS — Z510 Encounter for antineoplastic radiation therapy: Secondary | ICD-10-CM | POA: Diagnosis not present

## 2021-07-07 DIAGNOSIS — C7949 Secondary malignant neoplasm of other parts of nervous system: Secondary | ICD-10-CM | POA: Diagnosis not present

## 2021-07-07 DIAGNOSIS — C218 Malignant neoplasm of overlapping sites of rectum, anus and anal canal: Secondary | ICD-10-CM | POA: Diagnosis not present

## 2021-07-08 DIAGNOSIS — C701 Malignant neoplasm of spinal meninges: Secondary | ICD-10-CM | POA: Insufficient documentation

## 2021-07-09 NOTE — Progress Notes (Signed)
[facility]  [address] [place],  [place]  [address] [phone]  Clinic Day:  07/17/2021  Referring physician: Serita Grammes, MD   This document serves as a record of services personally performed by Hosie Poisson, MD. It was created on their behalf by Curry,Lauren E, a trained medical scribe. The creation of this record is based on the scribe's personal observations and the provider's statements to them.  CHIEF COMPLAINT:  CC: Stage IIIA rectosigmoid adenocarcinoma   Current Treatment:  Observation   HISTORY OF PRESENT ILLNESS:  Dakota Bennett is a 75 y.o. 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 This was found because of a positive Cologuard test, but he did have some rectal bleeding for several months.  Colonoscopy revealed an ulcerated mass at the proximal rectum at 9 cm and extending from the proximal rectum to the rectosigmoid junction.  An endoscopic ultrasound was done and confirmed a nonobstructing medium-sized mass of the proximal rectum involving 2/3 of the circumference and measuring 3 cm long, occurring 10 cm from the anal verge.  He also had 2 rounded suspicious perirectal lymph nodes measuring 5 and 6 mm.  CT scans of the abdomen and pelvis revealed several rounded nodules at the lung bases, which were tiny and largely unchanged from prior scans, so felt to likely be benign. CEA was elevated at 28.3.  He received neoadjuvant chemoradiation with 5 fluorouracil infusion for the 1st and 5th week of radiation and completed treatment the end of June.  He tolerated this fairly well, but had severe dysuria, as well as urinary hesitancy and soft stools.  CT chest revealed several pulmonary nodules, which had enlarged.  There was generalized wall thickening in the rectum, which could be due to the radiotherapy.  He underwent PET scan, which revealed increased uptake in  the pulmonary nodules felt to be consistent with metastatic disease.  There was no evidence of persistence of disease in the rectum or other areas of metastasis.  His surgical plans were canceled.  The CEA was normal at that time. We obtained KRAS testing on his tumor, which did show a mutation.  MMR testing of the tumor was normal.  We recommended palliative chemotherapy with FOLFOX/bevacizumab.  The patient had persistent thrombocytopenia, so we reduced his chemotherapy doses by a total of 25%.  Repeat imaging after 6 cycles of FOLFOX/bevacizumab was overall stable, there was some increase in the larger pulmonary nodules, however, they appeared necrotic indicating a response to therapy.   After 12 cycles of FOLFOX/bevacizumab restaging scans were obtained that showed the dominant left lower lobe lesion had substantially decreased in size and no new or progressive pulmonary nodule or masses were seen.  Oxaliplatin was discontinued due to neuropathy in January 2019.  Repeat CT imaging after 20 cycles revealed stable to decreased pulmonary nodules with no other evidence of metastasis.    He was continued on palliative chemotherapy with occasional changes in dosing and schedule.  He has had several episodes of rectal bleeding.  Dr. Lyndel Safe performed sigmoidoscopy in March 2020 which did not reveal any evidence of disease in the rectum.  He had worsening neuropathy of the feet, so was started on gabapentin 300 mg at bedtime and the dose was increased but later stopped.  While having a 39th cycle of infusional 5-fluorouracil, he contacted [place] to report episodes of chest pain since starting his treatment with radiation down  the arms and an episode of emesis.  He was referred to the emergency department and found to have had a non STEM in March 2020I.  His 5 FU chemotherapy pump was discontinued.  He was transferred to [facility] for further evaluation and treatment, and had 3 stents placed in early April. Per Dr.  Joya Gaskins telehealth note, the patient had residual LAD disease and recommended he be treated medically unless he has refractory angina, as he would require atherectomy of a proximal LAD lesion.  Dr. Bettina Gavia recommended discontinuing the gabapentin.  He placed the patient back on magnesium oxide 400 mg daily.  He was also started on clopidogrel 75 mg daily, as well as isosorbide mononitrate 30 mg once a day, amlodipine 5 mg once a day, atorvastatin 80 mg once a day and aspirin 81 mg daily.  Chemotherapy was held due to his recent non STEMI and resumed in May 2020.  Bevacizumab was discontinued due to recurrent rectal bleeding in June 2020 and the dose of chemotherapy was reduced by 10% due to worsening thrombocytopenia.  CT imaging in July 2020 revealed a mild interval increase in size of the extensive bilateral pulmonary nodules, as well as increased wall thickening of the rectum.  He also was found to have increasing splenomegaly.  His hemoglobin had also dropped to 7.8, and his platelet count had dropped to 80,000.  We therefore stopped his treatment due to progressive disease, as well as worsening anemia and thrombocytopenia.  He had evidence of recurrent iron deficiency, so received IV iron in July with improvement in his anemia.  Since July of 2020 he has had slow steady progression of his lung metastases but has remained asymptomatic.  His CEA has steadily increased as well.  CT chest, abdomen and pelvis from October 2021 revealed mild progression of the diffuse bilateral pulmonary metastases and a new 11 mm left mediastinal lymph node.  Index nodule in the medial left lower lobe currently measures 3.4 x 2.7 cm, compared to 2.8 x 2.2 cm previously.  Otherwise, the exam was stable.  CEA from October was 457.0.  Dakota Bennett presented to [facility] on November 22nd due to 5 episodes of bright red blood per rectum with dark clotting that day and was admitted.  His hemoglobin dropped down to 6 during his stay  and he received 2 units of PRBCs on November 24th.  He was advised to hold Plavix.  He underwent a flexible sigmoidoscopy on November 25th revealing a single small ulcer with a prominent visble vessel in the distal rectum. For hemostasis, one hemostatic clip was successfully placed. There was no bleeding at the end of the procedure.  A fungating and ulcerated non-obstructing mass was found in the rectum. The mass was traversable, circumferential and measured five cm in length. There was mild contact oozing present. This was biopsied and surgical pathology was consistent with adenocarcinoma.  The sigmoid colon mucosa proximal to the mass was otherwise normal appearing.  Non-bleeding small internal hemorrhoids were found during retroflexion.  His hemoglobin was up to 9.3 when I saw him in December.  At that time, we discussed options of palliation and referred him to Dr. Orlene Erm for some additional radiation.  He had COVID infection is December 2021.  Osei completed a 3250 cGy course of radiation, a total of 13 fractions, on January 21st 2022.  CT imaging from February revealed numerous pulmonary masses and nodules, which are significantly increased in size, consistent with worsened pulmonary metastatic disease.  An index mass of the dependent left lower lobe measures 4.0 x 3.2 cm, previously 2.8 x 2.5 cm. Another index mass of the subpleural right upper lobe measures 3.6 x 2.9 cm, previously 2.4 x 2.4 cm.  There is also interval enlargement of a hypodense AP window lymph node now measuring 1.9 x 1.4 cm, previously 1.8 x 1.1 cm, consistent with worsened nodal metastatic disease.  No significant interval change in the circumferential wall thickening of the distal sigmoid colon and rectum with adjacent fat stranding in the low pelvis, consistent with post treatment appearance of primary colorectal malignancy.  CT imaging from June revealed mildly increased size to some pulmonary nodules; similar appearance of  circumferential wall thickening of the distal sigmoid colon and rectum with surrounding soft tissue stranding; no sign of abdominopelvic adenopathy or solid organ metastasis within the abdomen.   INTERVAL HISTORY:  Rollen is here for routine follow up after he was found to have intradural metastasis.  MRI of the lumber spine revealed intradural tumor. The most prominent finding is a 2.8 x 1.5 x 1.9 cm intradural mass at the L1-L2 region, filling the thecal sac and displacing the nerves, more towards the left. There are some other tiny enhancing nodules within the other nerves of the cauda equina and there is some enhancement at the tip of the thecal sac.   CTA of the chest and CT abdomen and pelvis were stable, and revealed no further metastatic disease, but he does still have chronic thickening of the rectal wall.  He has received radiation therapy to the lumbar spine.  He notes rectal pain, and did have some rectal bleeding after taking an ibuprofen.  He knows to avoid ibuprofen, and the bleeding has since resolved.  For his pain, he is taking two Tylenol every 6 hours and an oxycodone at bedtime.  He notes his back pain is nearly resolved, but he does have extremity weakness and uses a walker to ambulate.  He is in bed more than 50% of the day.  Hemoglobin has improved from 10.5 at time of discharge to 11.5 today, and his platelet count is normal at 145,000.  Chemistries are unremarkable except for a mildly low sodium of 134.  His  appetite is worse, and he has lost 13 pounds since his last visit.  He denies fever, chills or other signs of infection.  He denies nausea, vomiting, bowel issues, or abdominal pain.  He denies sore throat, cough, dyspnea, or chest pain.  REVIEW OF SYSTEMS:  Review of Systems  Constitutional:  Positive for appetite change (poor), fatigue and unexpected weight change (13 pound weight loss). Negative for chills and fever.  HENT:  Negative.    Eyes: Negative.   Respiratory:  Negative.  Negative for chest tightness, cough, hemoptysis, shortness of breath and wheezing.   Cardiovascular: Negative.  Negative for chest pain, leg swelling and palpitations.  Gastrointestinal:  Positive for rectal pain. Negative for abdominal distention, abdominal pain, blood in stool, constipation, diarrhea, nausea and vomiting.  Endocrine: Negative.   Genitourinary: Negative.  Negative for difficulty urinating, dysuria, frequency and hematuria.   Musculoskeletal:  Positive for back pain (nearly resolved). Negative for arthralgias, flank pain, gait problem and myalgias.  Skin: Negative.   Neurological:  Positive for extremity weakness (using a walker). Negative for dizziness, gait problem, headaches, light-headedness, numbness, seizures and speech difficulty.  Hematological: Negative.   Psychiatric/Behavioral: Negative.  Negative for depression and sleep disturbance. The patient is not nervous/anxious.   All  other systems reviewed and are negative.   VITALS:  Blood pressure (!) 161/70, pulse 80, temperature 98.5 F (36.9 C), temperature source Oral, resp. rate 18, height 5' 10.6" (1.793 m), weight 168 lb (76.2 kg), SpO2 94 %.  Wt Readings from Last 3 Encounters:  07/17/21 168 lb (76.2 kg)  05/08/21 181 lb 3.2 oz (82.2 kg)  05/01/21 180 lb 8 oz (81.9 kg)    Body mass index is 23.7 kg/m.  Performance status (ECOG): 3 - Symptomatic, >50% confined to bed  PHYSICAL EXAM:  Physical Exam Constitutional:      General: He is not in acute distress.    Appearance: Normal appearance. He is normal weight.  HENT:     Head: Normocephalic and atraumatic.  Eyes:     General: No scleral icterus.    Extraocular Movements: Extraocular movements intact.     Conjunctiva/sclera: Conjunctivae normal.     Pupils: Pupils are equal, round, and reactive to light.  Cardiovascular:     Rate and Rhythm: Normal rate and regular rhythm.     Pulses: Normal pulses.     Heart sounds: Normal heart sounds.  No murmur heard.   No friction rub. No gallop.  Pulmonary:     Effort: Pulmonary effort is normal. No respiratory distress.     Breath sounds: Normal breath sounds.  Abdominal:     General: Bowel sounds are normal. There is no distension.     Palpations: Abdomen is soft. There is no hepatomegaly, splenomegaly or mass.     Tenderness: There is no abdominal tenderness.  Musculoskeletal:        General: Normal range of motion.     Cervical back: Normal range of motion and neck supple.     Right lower leg: No edema.     Left lower leg: No edema.  Lymphadenopathy:     Cervical: No cervical adenopathy.  Skin:    General: Skin is warm and dry.  Neurological:     General: No focal deficit present.     Mental Status: He is alert and oriented to person, place, and time. Mental status is at baseline.  Psychiatric:        Mood and Affect: Mood normal.        Behavior: Behavior normal.        Thought Content: Thought content normal.        Judgment: Judgment normal.   LABS:   CBC Latest Ref Rng & Units 07/17/2021 04/29/2021 03/18/2021  WBC - 5.3 6.3 4.8  Hemoglobin 13.5 - 17.5 11.5(A) 12.9(A) 11.8(A)  Hematocrit 41 - 53 34(A) 40(A) 37(A)  Platelets 150 - 399 145(A) 97(A) 87(A)   CMP Latest Ref Rng & Units 07/17/2021 04/29/2021 03/18/2021  Glucose 70 - 99 mg/dL - - -  BUN 4 - _0 Creatinine 0.6 - 1.3 0.6 0.6 0.6  Sodium 137 - 147 134(A) 135(A) 136(A)  Potassium 3.4 - 5.3 4.1 5.0 4.2  Chloride 99 - 108 102 105 106  CO2 13 - 22 22 23(A) 22  Calcium 8.7 - 10.7 8.7 9.0 8.5(A)  Total Protein 6.5 - 8.1 g/dL - - -  Total Bilirubin 0.3 - 1.2 mg/dL - - -  Alkaline Phos 25 - 125 181(A) 136(A) 126(A)  AST 14 - 40 34 30 31  ALT 10 - 40 _1 Lab Results  Component Value Date   CEA1 442.0 (H) 04/29/2021   /  CEA  Date Value Ref Range Status  04/29/2021 442.0 (H) 0.0 - 4.7 ng/mL Final    Comment:    (NOTE)                             Nonsmokers          <3.9                              Smokers             <5.6 [employer] Electrochemiluminescence Immunoassay (ECLIA) Values obtained with different assay methods or kits cannot be used interchangeably.  Results cannot be interpreted as absolute evidence of the presence or absence of malignant disease. Performed At: [facility] [place], [place] [id number] Rush Farmer MD MV:[phone]      STUDIES:   EXAM: 06/16/2021 CT ANGIOGRAPHY CHEST WITH CONTRAST  TECHNIQUE: Multidetector CT imaging of the chest was performed using the standard protocol during bolus administration of intravenous contrast. Multiplanar CT image reconstructions and MIPs were obtained to evaluate the vascular anatomy.  CONTRAST:  100 mL Isovue 370 iodinated contrast IV  COMPARISON:  CT chest abdomen pelvis, 04/30/2021  FINDINGS: Cardiovascular: Satisfactory opacification of the pulmonary arteries to the segmental level. No evidence of pulmonary embolism. Mild cardiomegaly. Three-vessel coronary artery calcifications. No pericardial effusion. Aortic atherosclerosis. Left chest port catheter.  Mediastinum/Nodes: Enlarged AP window lymph node, not significantly changed (series 4, image 46). No other enlarged mediastinal, hilar, or axillary lymph nodes. Thyroid gland, trachea, and esophagus demonstrate no significant findings.  Lungs/Pleura: Numerous bilateral pulmonary masses and nodules, not significantly changed compared to prior staging CT dated 04/30/2021. Mild underlying centrilobular emphysema. No pleural effusion or pneumothorax.  Upper Abdomen: Please see separately reported examination of the abdomen and pelvis.  Musculoskeletal: No chest wall abnormality. No acute or significant osseous findings.  Review of the MIP images confirms the above findings.  IMPRESSION: 1. Negative examination for pulmonary embolism. 2. Numerous bilateral pulmonary masses and nodules, not significantly  changed compared to prior staging CT dated 04/30/2021 and consistent with pulmonary metastatic disease. 3. Unchanged enlarged AP window lymph node, consistent with nodal metastatic disease. 4. Emphysema. 5. Coronary artery disease.  Aortic Atherosclerosis (ICD10-I70.0) and Emphysema (ICD10-J43.9).  EXAM: 06/16/2021 CT ABDOMEN AND PELVIS WITH CONTRAST  TECHNIQUE: Multidetector CT imaging of the abdomen and pelvis was performed using the standard protocol following bolus administration of intravenous contrast.  CONTRAST:  100 mL Isovue 370 iodinated contrast IV  COMPARISON:  CT chest abdomen pelvis, 04/30/2021  FINDINGS: Lower chest: Please see separately reported examination of the chest.  Hepatobiliary: No solid liver abnormality is seen. No gallstones, gallbladder wall thickening, or biliary dilatation.  Pancreas: Unremarkable. No pancreatic ductal dilatation or surrounding inflammatory changes.  Spleen: Gross splenomegaly, maximum coronal span 19.0 cm, unchanged.  Adrenals/Urinary Tract: Adrenal glands are unremarkable. Kidneys are normal, without renal calculi, solid lesion, or hydronephrosis. Bladder is unremarkable.  Stomach/Bowel: Stomach is within normal limits. Incidental diverticulum of the descending portion of the duodenum. Appendix appears normal. Generally large burden of stool throughout the colon sigmoid diverticula. Redemonstrated circumferential thickening of the rectum (series 5, image 79).  Vascular/Lymphatic: Aortic atherosclerosis. No enlarged abdominal or pelvic lymph nodes.  Reproductive: No mass or other significant abnormality.  Other: Large right inguinal hernia, containing a portion of the urinary bladder (series 5, image 88). No abdominopelvic ascites.  Musculoskeletal:  No acute or significant osseous findings.  IMPRESSION: 1. Redemonstrated circumferential thickening of the rectum, consistent with reported history of rectal  malignancy. 2. No acute abnormality of the abdomen or pelvis. No evidence of acute traumatic injury. 3. Generally large burden of stool throughout the colon. 4. Large right inguinal hernia, containing a portion of the urinary bladder. 5. Gross splenomegaly, maximum coronal span 19.0 cm, unchanged.  Aortic Atherosclerosis (ICD10-I70.0).  EXAM: 06/16/2021 MRI LUMBAR SPINE WITHOUT AND WITH CONTRAST  TECHNIQUE: Multiplanar and multiecho pulse sequences of the lumbar spine were obtained without and with intravenous contrast.  CONTRAST:  7 cc Gadavist  COMPARISON:  CT abdomen 03/08/2017 and 03/12/2020.  FINDINGS: Segmentation:  5 lumbar type vertebral bodies.  Alignment:  Normal  Vertebrae: No evidence of fracture or osseous metastatic disease in the region.  Conus medullaris and cauda equina: Conus extends to the T12-L1 level. There is an intradural mass lesion extending from the inferior endplate of L1 to the lower portion of the L2 vertebral body, extending over a length of 2.8 cm. Transverse measurements show complete filling of the canal, approximately 15 x 19 mm, displacing the nerve roots more towards the left. Slight heterogeneity of this enhancing mass, suggesting that there could be some internal microhemorrhage. There is low level enhancement at the tip of the thecal sac and there are a few tiny nodular enhancing foci seen elsewhere within the nerve roots including at the L3, L4 and L5 levels. In this clinical setting, intradural metastasis is most likely. Ependymoma is possible based on the imaging.  Paraspinal and other soft tissues: Chronic volume loss of the left kidney.  Disc levels:  Minimal, non-compressive disc bulges throughout the lumbar region. At L4-5, there is right foraminal stenosis due to asymmetric protrusion of the disc in combination with mild facet arthritis. The L4 nerve does not appear frankly compressed however. No pre-existing stenosis  or focal nerve compression otherwise.  IMPRESSION: Intradural tumor as outlined above. The most prominent finding is a 2.8 x 1.5 x 1.9 cm intradural mass at the L1-L2 region, filling the thecal sac and displacing the nerves, more towards the left. There are some other tiny enhancing nodules within the other nerves of the cauda equina and there is some enhancement at the tip of the thecal sac. All these findings are most consistent with intradural metastatic disease. Ependymoma is also possible on the basis of the imaging, but less likely given the patient's history.  Allergies:  Allergies  Allergen Reactions   Lisinopril Hives        Adhesive [Tape] Rash    Bandaids   Aspirin Other (See Comments)    Burns stomach    Neosporin [Neomycin-Bacitracin Zn-Polymyx] Rash    Current Medications: Current Outpatient Medications  Medication Sig Dispense Refill   diphenoxylate-atropine (LOMOTIL) 2.5-0.025 MG tablet Take 1-2 tablets by mouth 4 (four) times daily as needed for diarrhea or loose stools.     doxycycline (VIBRA-TABS) 100 MG tablet Take 100 mg by mouth as needed (rash).     furosemide (LASIX) 20 MG tablet Take 20 mg by mouth as needed for fluid or edema.     gabapentin (NEURONTIN) 300 MG capsule TAKE 1 CAPSULE BY MOUTH TWICE A DAY 60 capsule 5   metoprolol succinate (TOPROL-XL) 100 MG 24 hr tablet Take 100 mg by mouth 2 (two) times daily. Take with or immediately following a meal.     nitroGLYCERIN (NITROSTAT) 0.4 MG SL tablet Place 0.4 mg under the tongue every  5 (five) minutes as needed for chest pain.     omeprazole (PRILOSEC) 20 MG capsule Take 20 mg by mouth daily.     oxyCODONE (OXY IR/ROXICODONE) 5 MG immediate release tablet Take 1 tablet (5 mg total) by mouth every 4 (four) hours as needed for severe pain. 120 tablet 0   pravastatin (PRAVACHOL) 20 MG tablet Take 1 tablet (20 mg total) by mouth every evening. 90 tablet 3   spironolactone (ALDACTONE) 25 MG tablet Take 25 mg  by mouth daily.     vitamin C (ASCORBIC ACID) 500 MG tablet Take 500 mg by mouth daily.     No current facility-administered medications for this visit.     ASSESSMENT & PLAN:   Assessment:   1. Metastatic colorectal cancer to the lung.  He had mild progression of disease on palliative infusional 5 fluorouracil/leucovorin/bevacizumab every 3 weeks, so was switched back to infusional 5 fluorouracil/leucovorin/bevacizumab every 2 weeks.  Bevacizumab was discontinued in May 2020 due to rectal bleeding. In July 2020 chemotherapy was discontinued.  He has had continued slow steady progression of his disease on imaging, as well as an increasing CEA.    2.  Rectal cancer which has not been resected due to the metastatic disease.  In November 2021 sigmoidoscopy confirmed a 5 cm fungating and ulcerated non-obstructing mass in the rectum and biopsy was consistent with adenocarcinoma.  Therefore, he has received palliative radiation.   3. Thrombocytopenia, secondary to chemotherapy and splenomegaly, which is resolved today.  4. Splenomegaly and liver cirrhosis on CT imaging, which is likely contributing to his thrombocytopenia.  Physical exam is benign  5.  Intradural metastases.  The most prominent finding is a 2.8 x 1.5 x 1.9 cm intradural mass at the L1-L2 region, filling the thecal sac and displacing the nerves, more towards the left. There are some other tiny enhancing nodules within the other nerves of the cauda equina and there is some enhancement at the tip of the thecal sac. We will not pursue further chemotherapy and will focus on quality of life.  6.  Poor appetite and weight loss.  This is likely secondary to his malignancy.  We reviewed possible appetite stimulant with Remeron or corticosteroids, but will hold off for now.  7.  Lower extremity weakness, he is using a walker.  Plan: With his progressive findings, we reviewed whether or not to pursue further chemotherapy. With his current  performance status, and the patients wishes, we will not pursue chemotherapy.  We confirmed DNR status.  We discussed Hospice services but at this time there are no symptoms to manage.  We will see him back in 1 month for repeat CBC, CMP and evaluation.  He verbalizes understanding of and agreement to the plans discussed today. He knows to call the office should any new questions or concerns arise.    Derwood Kaplan, MD [facility] [address] [place] [address] Dept: [phone] Dept Fax: [phone]   I, Rita Ohara, am acting as scribe for Derwood Kaplan, MD  I have reviewed this report as typed by the medical scribe, and it is complete and accurate.

## 2021-07-17 ENCOUNTER — Telehealth: Payer: Self-pay | Admitting: Oncology

## 2021-07-17 ENCOUNTER — Encounter: Payer: Self-pay | Admitting: Oncology

## 2021-07-17 ENCOUNTER — Inpatient Hospital Stay (INDEPENDENT_AMBULATORY_CARE_PROVIDER_SITE_OTHER): Payer: Medicare Other | Admitting: Oncology

## 2021-07-17 ENCOUNTER — Inpatient Hospital Stay: Payer: Medicare Other | Attending: Oncology

## 2021-07-17 ENCOUNTER — Other Ambulatory Visit: Payer: Self-pay | Admitting: Oncology

## 2021-07-17 ENCOUNTER — Other Ambulatory Visit: Payer: Self-pay

## 2021-07-17 VITALS — BP 161/70 | HR 80 | Temp 98.5°F | Resp 18 | Ht 70.6 in | Wt 168.0 lb

## 2021-07-17 DIAGNOSIS — C701 Malignant neoplasm of spinal meninges: Secondary | ICD-10-CM

## 2021-07-17 DIAGNOSIS — C20 Malignant neoplasm of rectum: Secondary | ICD-10-CM

## 2021-07-17 DIAGNOSIS — C7802 Secondary malignant neoplasm of left lung: Secondary | ICD-10-CM

## 2021-07-17 DIAGNOSIS — D500 Iron deficiency anemia secondary to blood loss (chronic): Secondary | ICD-10-CM

## 2021-07-17 DIAGNOSIS — C7801 Secondary malignant neoplasm of right lung: Secondary | ICD-10-CM

## 2021-07-17 DIAGNOSIS — C7800 Secondary malignant neoplasm of unspecified lung: Secondary | ICD-10-CM | POA: Diagnosis not present

## 2021-07-17 DIAGNOSIS — D649 Anemia, unspecified: Secondary | ICD-10-CM | POA: Diagnosis not present

## 2021-07-17 DIAGNOSIS — K625 Hemorrhage of anus and rectum: Secondary | ICD-10-CM

## 2021-07-17 LAB — CBC AND DIFFERENTIAL
HCT: 34 — AB (ref 41–53)
Hemoglobin: 11.5 — AB (ref 13.5–17.5)
Neutrophils Absolute: 4.03
Platelets: 145 — AB (ref 150–399)
WBC: 5.3

## 2021-07-17 LAB — HEPATIC FUNCTION PANEL
ALT: 28 (ref 10–40)
AST: 34 (ref 14–40)
Alkaline Phosphatase: 181 — AB (ref 25–125)
Bilirubin, Total: 0.8

## 2021-07-17 LAB — COMPREHENSIVE METABOLIC PANEL
Albumin: 3.7 (ref 3.5–5.0)
Calcium: 8.7 (ref 8.7–10.7)

## 2021-07-17 LAB — CBC: RBC: 3.95 (ref 3.87–5.11)

## 2021-07-17 LAB — BASIC METABOLIC PANEL
BUN: 7 (ref 4–21)
CO2: 22 (ref 13–22)
Chloride: 102 (ref 99–108)
Creatinine: 0.6 (ref 0.6–1.3)
Glucose: 107
Potassium: 4.1 (ref 3.4–5.3)
Sodium: 134 — AB (ref 137–147)

## 2021-07-17 NOTE — Telephone Encounter (Signed)
Per 8/25 LOS, patient scheduled for Sept Appt's.  Gave patient Appt Calendar 

## 2021-07-24 ENCOUNTER — Telehealth: Payer: Self-pay

## 2021-07-24 DIAGNOSIS — C20 Malignant neoplasm of rectum: Secondary | ICD-10-CM | POA: Diagnosis not present

## 2021-07-24 DIAGNOSIS — C7800 Secondary malignant neoplasm of unspecified lung: Secondary | ICD-10-CM | POA: Diagnosis not present

## 2021-07-24 DIAGNOSIS — S4991XA Unspecified injury of right shoulder and upper arm, initial encounter: Secondary | ICD-10-CM | POA: Diagnosis not present

## 2021-07-24 DIAGNOSIS — C7951 Secondary malignant neoplasm of bone: Secondary | ICD-10-CM | POA: Diagnosis not present

## 2021-07-24 NOTE — Telephone Encounter (Signed)
I notified Dakota Bennett of Dr Remi Deter response below via her confidential voice mail.   Per Dr Hinton Rao - yes, he is appropriate for hospice, I have discussed with him and family a few times.  I will be glad to be attending if he wishes.    Received call from [facility]. They received a referral for Hospice on this pt from Dr Jeryl Columbia today. They wanted to know if you agreed with this as well. Do you want to be attending or no?

## 2021-07-29 ENCOUNTER — Telehealth: Payer: Self-pay

## 2021-07-29 NOTE — Telephone Encounter (Signed)
-----   Message from Derwood Kaplan, MD sent at 07/25/2021  6:19 PM EDT ----- Regarding: Home PT? Rec'd call earlier that Dr. Jeryl Columbia had ref to Hospice and I agreed.  But I had call from [facility] with hospice that wife plans on chemo when "feeling better" so they will close case for now.  I called wife this afternoon and reviewed the fact that he is declining, with weight loss, inc.mets. and she acknowledges that he is sleeping much of the time.  I explained he is not likely able to take chemo again but he wishes to keep his options open and see how he does.  We disc. that FOLFIRI is not an easy chemo.  He is very weak and had a fall in the shower.  She did not realize that couldn't have active treatment if on hospice, just would like more help.  Can we see if he qualifies for Home PT?  They would like to try but not sure how much he will be able to do.  He is homebound.

## 2021-07-29 NOTE — Telephone Encounter (Signed)
Referral for home health PT sent to [facility].

## 2021-07-31 ENCOUNTER — Other Ambulatory Visit: Payer: Self-pay

## 2021-07-31 DIAGNOSIS — Z7984 Long term (current) use of oral hypoglycemic drugs: Secondary | ICD-10-CM | POA: Diagnosis not present

## 2021-07-31 DIAGNOSIS — C7802 Secondary malignant neoplasm of left lung: Secondary | ICD-10-CM | POA: Diagnosis not present

## 2021-07-31 DIAGNOSIS — R161 Splenomegaly, not elsewhere classified: Secondary | ICD-10-CM | POA: Diagnosis not present

## 2021-07-31 DIAGNOSIS — E119 Type 2 diabetes mellitus without complications: Secondary | ICD-10-CM | POA: Diagnosis not present

## 2021-07-31 DIAGNOSIS — C7801 Secondary malignant neoplasm of right lung: Secondary | ICD-10-CM | POA: Diagnosis not present

## 2021-07-31 DIAGNOSIS — Z8616 Personal history of COVID-19: Secondary | ICD-10-CM | POA: Diagnosis not present

## 2021-07-31 DIAGNOSIS — C19 Malignant neoplasm of rectosigmoid junction: Secondary | ICD-10-CM | POA: Diagnosis not present

## 2021-07-31 DIAGNOSIS — K7460 Unspecified cirrhosis of liver: Secondary | ICD-10-CM | POA: Diagnosis not present

## 2021-07-31 DIAGNOSIS — C7949 Secondary malignant neoplasm of other parts of nervous system: Secondary | ICD-10-CM | POA: Diagnosis not present

## 2021-07-31 DIAGNOSIS — G620 Drug-induced polyneuropathy: Secondary | ICD-10-CM | POA: Diagnosis not present

## 2021-07-31 DIAGNOSIS — T451X5D Adverse effect of antineoplastic and immunosuppressive drugs, subsequent encounter: Secondary | ICD-10-CM | POA: Diagnosis not present

## 2021-08-01 ENCOUNTER — Telehealth: Payer: Self-pay

## 2021-08-01 ENCOUNTER — Other Ambulatory Visit: Payer: Medicare Other

## 2021-08-01 ENCOUNTER — Ambulatory Visit: Payer: Medicare Other | Admitting: Oncology

## 2021-08-01 NOTE — Telephone Encounter (Signed)
Jason with [facility] called to inform us about patients PT eval recommendations. PT recommends PT 2x a week for 4 weeks then 1x a week for 4 weeks for balance and fall prevention due to increase weakness in the right leg. Also states that OT evals is recommended for bathroom and bedroom safety. Corene Cornea said they will be contacting patient PCP (Dr. Jeryl Columbia) regarding BS checking and how often and management of BS medications. They will send a list of current medications that patient is currently taking via fax later today. If any questions feel free to contact [facility].  Jason cell #: [phone].

## 2021-08-04 ENCOUNTER — Other Ambulatory Visit: Payer: Self-pay

## 2021-08-05 ENCOUNTER — Telehealth: Payer: Self-pay

## 2021-08-05 NOTE — Telephone Encounter (Signed)
Dr. Hinton Rao agrees with all recommendation Corene Cornea PT made.  Verbal order given.

## 2021-08-06 DIAGNOSIS — C7949 Secondary malignant neoplasm of other parts of nervous system: Secondary | ICD-10-CM | POA: Diagnosis not present

## 2021-08-06 DIAGNOSIS — C7802 Secondary malignant neoplasm of left lung: Secondary | ICD-10-CM | POA: Diagnosis not present

## 2021-08-06 DIAGNOSIS — C7801 Secondary malignant neoplasm of right lung: Secondary | ICD-10-CM | POA: Diagnosis not present

## 2021-08-06 DIAGNOSIS — K7460 Unspecified cirrhosis of liver: Secondary | ICD-10-CM | POA: Diagnosis not present

## 2021-08-06 DIAGNOSIS — C19 Malignant neoplasm of rectosigmoid junction: Secondary | ICD-10-CM | POA: Diagnosis not present

## 2021-08-06 DIAGNOSIS — E119 Type 2 diabetes mellitus without complications: Secondary | ICD-10-CM | POA: Diagnosis not present

## 2021-08-07 DIAGNOSIS — C20 Malignant neoplasm of rectum: Secondary | ICD-10-CM | POA: Diagnosis not present

## 2021-08-07 DIAGNOSIS — I10 Essential (primary) hypertension: Secondary | ICD-10-CM | POA: Diagnosis not present

## 2021-08-07 DIAGNOSIS — C7800 Secondary malignant neoplasm of unspecified lung: Secondary | ICD-10-CM | POA: Diagnosis not present

## 2021-08-07 DIAGNOSIS — C7949 Secondary malignant neoplasm of other parts of nervous system: Secondary | ICD-10-CM | POA: Diagnosis not present

## 2021-08-08 ENCOUNTER — Encounter: Payer: Self-pay | Admitting: Oncology

## 2021-08-08 DIAGNOSIS — E119 Type 2 diabetes mellitus without complications: Secondary | ICD-10-CM | POA: Diagnosis not present

## 2021-08-08 DIAGNOSIS — C7949 Secondary malignant neoplasm of other parts of nervous system: Secondary | ICD-10-CM | POA: Diagnosis not present

## 2021-08-08 DIAGNOSIS — C19 Malignant neoplasm of rectosigmoid junction: Secondary | ICD-10-CM | POA: Diagnosis not present

## 2021-08-08 DIAGNOSIS — K7460 Unspecified cirrhosis of liver: Secondary | ICD-10-CM | POA: Diagnosis not present

## 2021-08-08 DIAGNOSIS — C7802 Secondary malignant neoplasm of left lung: Secondary | ICD-10-CM | POA: Diagnosis not present

## 2021-08-08 DIAGNOSIS — C7801 Secondary malignant neoplasm of right lung: Secondary | ICD-10-CM | POA: Diagnosis not present

## 2021-08-12 DIAGNOSIS — C7802 Secondary malignant neoplasm of left lung: Secondary | ICD-10-CM | POA: Diagnosis not present

## 2021-08-12 DIAGNOSIS — C7801 Secondary malignant neoplasm of right lung: Secondary | ICD-10-CM | POA: Diagnosis not present

## 2021-08-12 DIAGNOSIS — E119 Type 2 diabetes mellitus without complications: Secondary | ICD-10-CM | POA: Diagnosis not present

## 2021-08-12 DIAGNOSIS — K7460 Unspecified cirrhosis of liver: Secondary | ICD-10-CM | POA: Diagnosis not present

## 2021-08-12 DIAGNOSIS — C7949 Secondary malignant neoplasm of other parts of nervous system: Secondary | ICD-10-CM | POA: Diagnosis not present

## 2021-08-12 DIAGNOSIS — C19 Malignant neoplasm of rectosigmoid junction: Secondary | ICD-10-CM | POA: Diagnosis not present

## 2021-08-12 NOTE — Progress Notes (Signed)
[facility]  [address] [place],  [place]  [address] [phone]  Clinic Day:  08/19/2021  Referring physician: Serita Grammes, MD   This document serves as a record of services personally performed by Hosie Poisson, MD. It was created on their behalf by Curry,Lauren E, a trained medical scribe. The creation of this record is based on the scribe's personal observations and the provider's statements to them.  CHIEF COMPLAINT:  CC: Stage IIIA rectosigmoid adenocarcinoma   Current Treatment:  Observation   HISTORY OF PRESENT ILLNESS:  Dakota Bennett is a 75 y.o. 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 This was found because of a positive Cologuard test, but he did have some rectal bleeding for several months.  Colonoscopy revealed an ulcerated mass at the proximal rectum at 9 cm and extending from the proximal rectum to the rectosigmoid junction.  An endoscopic ultrasound was done and confirmed a nonobstructing medium-sized mass of the proximal rectum involving 2/3 of the circumference and measuring 3 cm long, occurring 10 cm from the anal verge.  He also had 2 rounded suspicious perirectal lymph nodes measuring 5 and 6 mm.  CT scans of the abdomen and pelvis revealed several rounded nodules at the lung bases, which were tiny and largely unchanged from prior scans, so felt to likely be benign. CEA was elevated at 28.3.  He received neoadjuvant chemoradiation with 5 fluorouracil infusion for the 1st and 5th week of radiation and completed treatment the end of June.  He tolerated this fairly well, but had severe dysuria, as well as urinary hesitancy and soft stools.  CT chest revealed several pulmonary nodules, which had enlarged.  There was generalized wall thickening in the rectum, which could be due to the radiotherapy.  He underwent PET scan, which revealed increased uptake in  the pulmonary nodules felt to be consistent with metastatic disease.  There was no evidence of persistence of disease in the rectum or other areas of metastasis.  His surgical plans were canceled.  The CEA was normal at that time. We obtained KRAS testing on his tumor, which did show a mutation.  MMR testing of the tumor was normal.  We recommended palliative chemotherapy with FOLFOX/bevacizumab.  The patient had persistent thrombocytopenia, so we reduced his chemotherapy doses by a total of 25%.  Repeat imaging after 6 cycles of FOLFOX/bevacizumab was overall stable, there was some increase in the larger pulmonary nodules, however, they appeared necrotic indicating a response to therapy.   After 12 cycles of FOLFOX/bevacizumab restaging scans were obtained that showed the dominant left lower lobe lesion had substantially decreased in size and no new or progressive pulmonary nodule or masses were seen.  Oxaliplatin was discontinued due to neuropathy in January 2019.  Repeat CT imaging after 20 cycles revealed stable to decreased pulmonary nodules with no other evidence of metastasis.    He was continued on palliative chemotherapy with occasional changes in dosing and schedule.  He has had several episodes of rectal bleeding.  Dr. Lyndel Safe performed sigmoidoscopy in March 2020 which did not reveal any evidence of disease in the rectum.  He had worsening neuropathy of the feet, so was started on gabapentin 300 mg at bedtime and the dose was increased but later stopped.  While having a 39th cycle of infusional 5-fluorouracil, he contacted [place] to report episodes of chest pain since starting his treatment with radiation down  the arms and an episode of emesis.  He was referred to the emergency department and found to have had a non STEM in March 2020I.  His 5 FU chemotherapy pump was discontinued.  He was transferred to [facility] for further evaluation and treatment, and had 3 stents placed in early April. Per Dr.  Joya Gaskins telehealth note, the patient had residual LAD disease and recommended he be treated medically unless he has refractory angina, as he would require atherectomy of a proximal LAD lesion.  Dr. Bettina Gavia recommended discontinuing the gabapentin.  He placed the patient back on magnesium oxide 400 mg daily.  He was also started on clopidogrel 75 mg daily, as well as isosorbide mononitrate 30 mg once a day, amlodipine 5 mg once a day, atorvastatin 80 mg once a day and aspirin 81 mg daily.  Chemotherapy was held due to his recent non STEMI and resumed in May 2020.  Bevacizumab was discontinued due to recurrent rectal bleeding in June 2020 and the dose of chemotherapy was reduced by 10% due to worsening thrombocytopenia.  CT imaging in July 2020 revealed a mild interval increase in size of the extensive bilateral pulmonary nodules, as well as increased wall thickening of the rectum.  He also was found to have increasing splenomegaly.  His hemoglobin had also dropped to 7.8, and his platelet count had dropped to 80,000.  We therefore stopped his treatment due to progressive disease, as well as worsening anemia and thrombocytopenia.  He had evidence of recurrent iron deficiency, so received IV iron in July with improvement in his anemia.  Since July of 2020 he has had slow steady progression of his lung metastases but has remained asymptomatic.  His CEA has steadily increased as well.  CT chest, abdomen and pelvis from October 2021 revealed mild progression of the diffuse bilateral pulmonary metastases and a new 11 mm left mediastinal lymph node.  Index nodule in the medial left lower lobe currently measures 3.4 x 2.7 cm, compared to 2.8 x 2.2 cm previously.  Otherwise, the exam was stable.  CEA from October was 457.0.  Dakota Bennett presented to [facility] on November 22nd due to 5 episodes of bright red blood per rectum with dark clotting that day and was admitted.  His hemoglobin dropped down to 6 during his stay  and he received 2 units of PRBCs on November 24th.  He was advised to hold Plavix.  He underwent a flexible sigmoidoscopy on November 25th revealing a single small ulcer with a prominent visble vessel in the distal rectum. For hemostasis, one hemostatic clip was successfully placed. There was no bleeding at the end of the procedure.  A fungating and ulcerated non-obstructing mass was found in the rectum. The mass was traversable, circumferential and measured five cm in length. There was mild contact oozing present. This was biopsied and surgical pathology was consistent with adenocarcinoma.  The sigmoid colon mucosa proximal to the mass was otherwise normal appearing.  Non-bleeding small internal hemorrhoids were found during retroflexion.  His hemoglobin was up to 9.3 when I saw him in December.  At that time, we discussed options of palliation and referred him to Dr. Orlene Erm for some additional radiation.  He had COVID infection is December 2021.  Osei completed a 3250 cGy course of radiation, a total of 13 fractions, on January 21st 2022.  CT imaging from February revealed numerous pulmonary masses and nodules, which are significantly increased in size, consistent with worsened pulmonary metastatic disease.  An index mass of the dependent left lower lobe measures 4.0 x 3.2 cm, previously 2.8 x 2.5 cm. Another index mass of the subpleural right upper lobe measures 3.6 x 2.9 cm, previously 2.4 x 2.4 cm.  There is also interval enlargement of a hypodense AP window lymph node now measuring 1.9 x 1.4 cm, previously 1.8 x 1.1 cm, consistent with worsened nodal metastatic disease.  No significant interval change in the circumferential wall thickening of the distal sigmoid colon and rectum with adjacent fat stranding in the low pelvis, consistent with post treatment appearance of primary colorectal malignancy.  CT imaging from June revealed mildly increased size to some pulmonary nodules; similar appearance of  circumferential wall thickening of the distal sigmoid colon and rectum with surrounding soft tissue stranding; no sign of abdominopelvic adenopathy or solid organ metastasis within the abdomen.   In July 2022, MRI of the lumber spine revealed intradural tumor. The most prominent finding is a 2.8 x 1.5 x 1.9 cm intradural mass at the L1-L2 region, filling the thecal sac and displacing the nerves, more towards the left. There are some other tiny enhancing nodules within the other nerves of the cauda equina and there is some enhancement at the tip of the thecal sac.   CTA of the chest and CT abdomen and pelvis were stable, and revealed no further metastatic disease, but he does still have chronic thickening of the rectal wall.  He has received radiation therapy to the lumbar spine.    INTERVAL HISTORY:  Dakota Bennett is here for routine follow up. He rates his rectal pain as a 3/10. His back pain is nearly resolved and he states that this only occurs intermittently. He continues to use a walker to ambulate at home, but comes into the clinic in a wheelchair. He continues physical therapy twice weekly with improvement. He still has some lower extremity weakness, worse on the right. Hemoglobin is mildly improved from 11.5 to 11.9, platelet count has decreased from 145,000 to 107,000, and white count is normal. Chemistries are unremarkable except for a sodium of 132. His  appetite is fair but he notes early satiety, and he has lost another 7 pounds since his last visit.  He denies fever, chills or other signs of infection.  He denies nausea, vomiting, bowel issues, or abdominal pain.  He denies sore throat, cough, dyspnea, or chest pain.  REVIEW OF SYSTEMS:  Review of Systems  Constitutional:  Positive for appetite change (fair but with early satiety) and unexpected weight change (7 pound weight loss). Negative for chills, fatigue and fever.  HENT:  Negative.    Eyes: Negative.   Respiratory: Negative.  Negative for  chest tightness, cough, hemoptysis, shortness of breath and wheezing.   Cardiovascular: Negative.  Negative for chest pain, leg swelling and palpitations.  Gastrointestinal:  Positive for rectal pain. Negative for abdominal distention, abdominal pain, blood in stool, constipation, diarrhea, nausea and vomiting.  Endocrine: Negative.   Genitourinary: Negative.  Negative for difficulty urinating, dysuria, frequency and hematuria.   Musculoskeletal:  Positive for back pain (nearly resolved). Negative for arthralgias, flank pain, gait problem and myalgias.  Skin: Negative.   Neurological:  Positive for extremity weakness (using a walker, worse of the right leg) and headaches (only once). Negative for dizziness, gait problem, light-headedness, numbness, seizures and speech difficulty.  Hematological: Negative.   Psychiatric/Behavioral: Negative.  Negative for depression and sleep disturbance. The patient is not nervous/anxious.   All other systems reviewed and are negative.  VITALS:  Blood pressure (!) 146/66, pulse 62, temperature 97.8 F (36.6 C), temperature source Oral, resp. rate 18, height 5' 10.5" (1.791 m), weight 160 lb 14.4 oz (73 kg), SpO2 95 %.  Wt Readings from Last 3 Encounters:  08/19/21 160 lb 14.4 oz (73 kg)  07/17/21 168 lb (76.2 kg)  05/08/21 181 lb 3.2 oz (82.2 kg)    Body mass index is 22.76 kg/m.  Performance status (ECOG): 2 - Symptomatic, <50% confined to bed  PHYSICAL EXAM:  Physical Exam Constitutional:      General: He is not in acute distress.    Appearance: Normal appearance. He is normal weight.  HENT:     Head: Normocephalic and atraumatic.  Eyes:     General: No scleral icterus.    Extraocular Movements: Extraocular movements intact.     Conjunctiva/sclera: Conjunctivae normal.     Pupils: Pupils are equal, round, and reactive to light.  Cardiovascular:     Rate and Rhythm: Normal rate and regular rhythm.     Pulses: Normal pulses.     Heart sounds:  Normal heart sounds. No murmur heard.   No friction rub. No gallop.  Pulmonary:     Effort: Pulmonary effort is normal. No respiratory distress.     Breath sounds: Normal breath sounds.  Abdominal:     General: Bowel sounds are normal. There is no distension.     Palpations: Abdomen is soft. There is no hepatomegaly, splenomegaly or mass.     Tenderness: There is no abdominal tenderness.  Musculoskeletal:        General: Normal range of motion.     Cervical back: Normal range of motion and neck supple.     Right lower leg: No edema.     Left lower leg: No edema.  Lymphadenopathy:     Cervical: No cervical adenopathy.  Skin:    General: Skin is warm and dry.  Neurological:     General: No focal deficit present.     Mental Status: He is alert and oriented to person, place, and time. Mental status is at baseline.  Psychiatric:        Mood and Affect: Mood normal.        Behavior: Behavior normal.        Thought Content: Thought content normal.        Judgment: Judgment normal.   LABS:   CBC Latest Ref Rng & Units 08/19/2021 07/17/2021 04/29/2021  WBC - 6.0 5.3 6.3  Hemoglobin 13.5 - 17.5 11.9(A) 11.5(A) 12.9(A)  Hematocrit 41 - 53 36(A) 34(A) 40(A)  Platelets 150 - 399 107(A) 145(A) 97(A)   CMP Latest Ref Rng & Units 08/19/2021 07/17/2021 04/29/2021  Glucose 70 - 99 mg/dL - - -  BUN 4 - $R'21 9 7 13  'FC$ Creatinine 0.6 - 1.3 0.6 0.6 0.6  Sodium 137 - 147 132(A) 134(A) 135(A)  Potassium 3.4 - 5.3 4.3 4.1 5.0  Chloride 99 - 108 101 102 105  CO2 13 - 22 23(A) 22 23(A)  Calcium 8.7 - 10.7 8.8 8.7 9.0  Total Protein 6.5 - 8.1 g/dL - - -  Total Bilirubin 0.3 - 1.2 mg/dL - - -  Alkaline Phos 25 - 125 127(A) 181(A) 136(A)  AST 14 - 40 44(A) 34 30  ALT 10 - 40 $Re'27 28 23     'Pkv$ Lab Results  Component Value Date   CEA1 442.0 (H) 04/29/2021   /  CEA  Date Value Ref Range Status  04/29/2021 442.0 (H) 0.0 - 4.7 ng/mL Final    Comment:    (NOTE)                             Nonsmokers           <3.9                             Smokers             <5.6 Roche [employer] Electrochemiluminescence Immunoassay (ECLIA) Values obtained with different assay methods or kits cannot be used interchangeably.  Results cannot be interpreted as absolute evidence of the presence or absence of malignant disease. Performed At: [facility] [place], [place] [id number] Rush Farmer MD YV:[id number]      STUDIES:   Allergies:  Allergies  Allergen Reactions   Lisinopril Hives        Adhesive [Tape] Rash    Bandaids   Aspirin Other (See Comments)    Burns stomach    Neosporin [Neomycin-Bacitracin Zn-Polymyx] Rash    Current Medications: Current Outpatient Medications  Medication Sig Dispense Refill   diphenoxylate-atropine (LOMOTIL) 2.5-0.025 MG tablet Take 1-2 tablets by mouth 4 (four) times daily as needed for diarrhea or loose stools.     doxycycline (VIBRA-TABS) 100 MG tablet Take 100 mg by mouth as needed (rash).     famotidine (PEPCID) 20 MG tablet Take 20 mg by mouth daily.     gabapentin (NEURONTIN) 300 MG capsule TAKE 1 CAPSULE BY MOUTH TWICE A DAY 60 capsule 5   minoxidil (LONITEN) 2.5 MG tablet Take 5 mg by mouth 2 (two) times daily.     nitroGLYCERIN (NITROSTAT) 0.4 MG SL tablet Place 0.4 mg under the tongue every 5 (five) minutes as needed for chest pain.     oxyCODONE (OXY IR/ROXICODONE) 5 MG immediate release tablet Take 1 tablet (5 mg total) by mouth every 4 (four) hours as needed for severe pain. 120 tablet 0   Potassium Chloride ER 20 MEQ TBCR Take 1 tablet by mouth daily.     pravastatin (PRAVACHOL) 20 MG tablet Take 1 tablet (20 mg total) by mouth every evening. 90 tablet 3   rOPINIRole (REQUIP) 0.25 MG tablet Take 0.25 mg by mouth at bedtime.     No current facility-administered medications for this visit.     ASSESSMENT & PLAN:   Assessment:   1. Metastatic colorectal cancer to the lung.  He had mild progression of disease on  palliative infusional 5 fluorouracil/leucovorin/bevacizumab every 3 weeks, so was switched back to infusional 5 fluorouracil/leucovorin/bevacizumab every 2 weeks.  Bevacizumab was discontinued in May 2020 due to rectal bleeding. In July 2020 chemotherapy was discontinued.  He has had continued slow steady progression of his disease on imaging, as well as an increasing CEA.    2.  Rectal cancer which has not been resected due to the metastatic disease.  In November 2021 sigmoidoscopy confirmed a 5 cm fungating and ulcerated non-obstructing mass in the rectum and biopsy was consistent with adenocarcinoma.  Therefore, he has received palliative radiation x2.   3. Thrombocytopenia, secondary to chemotherapy and splenomegaly.  4. Splenomegaly and liver cirrhosis on CT imaging, which is likely contributing to his thrombocytopenia.  Physical exam is benign  5.  Intradural metastases.  The most prominent finding is a 2.8 x 1.5 x 1.9 cm intradural mass at the  L1-L2 region, filling the thecal sac and displacing the nerves, more towards the left. There are some other tiny enhancing nodules within the other nerves of the cauda equina and there is some enhancement at the tip of the thecal sac. We will not pursue further chemotherapy and will focus on quality of life. He has received palliative radiation.  6.  Poor appetite and weight loss.  This is likely secondary to his malignancy.  We reviewed possible appetite stimulant with Remeron or corticosteroids, but will hold off for now.  7.  Lower extremity weakness, he is using a walker and continuing physical therapy.  8.  His wife notes that he has some decrease in memory and is repeating himself. He denies headaches, blurred vision or other neurologic symptoms. He is certainly at higher risk for brain metastasis due to the dural involvement of the lumbar spine. At this time, I do not have a strong suspicion of brain metastases and so we will hold off on brain  imaging.  Plan: He continues physical therapy and is making good improvement, however, I still doubt that he would be able to pursue further chemotherapy in his future. I explained that we do not want to degrade his quality of life, and his quality of life currently is fair. He will continue with physical therapy twice weekly. If he declines, we could consider Hospice services but at this time there are no symptoms to manage.  We will see him back in 1 month for repeat CBC, CMP and port flush.  He does have a DNR in place. He verbalizes understanding of and agreement to the plans discussed today. He knows to call the office should any new questions or concerns arise.    Derwood Kaplan, MD [facility] AT [facility] [address] [place] [address] Dept: [phone] Dept Fax: [phone]   I, Rita Ohara, am acting as scribe for Derwood Kaplan, MD  I have reviewed this report as typed by the medical scribe, and it is complete and accurate.

## 2021-08-14 DIAGNOSIS — E119 Type 2 diabetes mellitus without complications: Secondary | ICD-10-CM | POA: Diagnosis not present

## 2021-08-14 DIAGNOSIS — C19 Malignant neoplasm of rectosigmoid junction: Secondary | ICD-10-CM | POA: Diagnosis not present

## 2021-08-14 DIAGNOSIS — C7802 Secondary malignant neoplasm of left lung: Secondary | ICD-10-CM | POA: Diagnosis not present

## 2021-08-14 DIAGNOSIS — K7460 Unspecified cirrhosis of liver: Secondary | ICD-10-CM | POA: Diagnosis not present

## 2021-08-14 DIAGNOSIS — C7801 Secondary malignant neoplasm of right lung: Secondary | ICD-10-CM | POA: Diagnosis not present

## 2021-08-14 DIAGNOSIS — C7949 Secondary malignant neoplasm of other parts of nervous system: Secondary | ICD-10-CM | POA: Diagnosis not present

## 2021-08-15 DIAGNOSIS — C7801 Secondary malignant neoplasm of right lung: Secondary | ICD-10-CM | POA: Diagnosis not present

## 2021-08-15 DIAGNOSIS — C7949 Secondary malignant neoplasm of other parts of nervous system: Secondary | ICD-10-CM | POA: Diagnosis not present

## 2021-08-15 DIAGNOSIS — C19 Malignant neoplasm of rectosigmoid junction: Secondary | ICD-10-CM | POA: Diagnosis not present

## 2021-08-15 DIAGNOSIS — C7802 Secondary malignant neoplasm of left lung: Secondary | ICD-10-CM | POA: Diagnosis not present

## 2021-08-15 DIAGNOSIS — K7460 Unspecified cirrhosis of liver: Secondary | ICD-10-CM | POA: Diagnosis not present

## 2021-08-15 DIAGNOSIS — E119 Type 2 diabetes mellitus without complications: Secondary | ICD-10-CM | POA: Diagnosis not present

## 2021-08-18 DIAGNOSIS — C19 Malignant neoplasm of rectosigmoid junction: Secondary | ICD-10-CM | POA: Diagnosis not present

## 2021-08-18 DIAGNOSIS — K7460 Unspecified cirrhosis of liver: Secondary | ICD-10-CM | POA: Diagnosis not present

## 2021-08-18 DIAGNOSIS — C7802 Secondary malignant neoplasm of left lung: Secondary | ICD-10-CM | POA: Diagnosis not present

## 2021-08-18 DIAGNOSIS — C7949 Secondary malignant neoplasm of other parts of nervous system: Secondary | ICD-10-CM | POA: Diagnosis not present

## 2021-08-18 DIAGNOSIS — C7801 Secondary malignant neoplasm of right lung: Secondary | ICD-10-CM | POA: Diagnosis not present

## 2021-08-18 DIAGNOSIS — E119 Type 2 diabetes mellitus without complications: Secondary | ICD-10-CM | POA: Diagnosis not present

## 2021-08-19 ENCOUNTER — Other Ambulatory Visit: Payer: Self-pay | Admitting: Hematology and Oncology

## 2021-08-19 ENCOUNTER — Inpatient Hospital Stay: Payer: Medicare Other | Attending: Oncology

## 2021-08-19 ENCOUNTER — Telehealth: Payer: Self-pay | Admitting: Oncology

## 2021-08-19 ENCOUNTER — Inpatient Hospital Stay (INDEPENDENT_AMBULATORY_CARE_PROVIDER_SITE_OTHER): Payer: Medicare Other | Admitting: Oncology

## 2021-08-19 ENCOUNTER — Encounter: Payer: Self-pay | Admitting: Oncology

## 2021-08-19 VITALS — BP 146/66 | HR 62 | Temp 97.8°F | Resp 18 | Ht 70.5 in | Wt 160.9 lb

## 2021-08-19 DIAGNOSIS — C7802 Secondary malignant neoplasm of left lung: Secondary | ICD-10-CM | POA: Diagnosis not present

## 2021-08-19 DIAGNOSIS — C7801 Secondary malignant neoplasm of right lung: Secondary | ICD-10-CM | POA: Diagnosis not present

## 2021-08-19 DIAGNOSIS — C7900 Secondary malignant neoplasm of unspecified kidney and renal pelvis: Secondary | ICD-10-CM | POA: Diagnosis not present

## 2021-08-19 DIAGNOSIS — C20 Malignant neoplasm of rectum: Secondary | ICD-10-CM

## 2021-08-19 DIAGNOSIS — C701 Malignant neoplasm of spinal meninges: Secondary | ICD-10-CM | POA: Diagnosis not present

## 2021-08-19 DIAGNOSIS — C7800 Secondary malignant neoplasm of unspecified lung: Secondary | ICD-10-CM | POA: Diagnosis not present

## 2021-08-19 LAB — CBC
MCV: 87 (ref 80–94)
RBC: 4.14 (ref 3.87–5.11)

## 2021-08-19 LAB — CBC AND DIFFERENTIAL
HCT: 36 — AB (ref 41–53)
Hemoglobin: 11.9 — AB (ref 13.5–17.5)
Neutrophils Absolute: 4.44
Platelets: 107 — AB (ref 150–399)
WBC: 6

## 2021-08-19 LAB — BASIC METABOLIC PANEL
BUN: 9 (ref 4–21)
CO2: 23 — AB (ref 13–22)
Chloride: 101 (ref 99–108)
Creatinine: 0.6 (ref 0.6–1.3)
Glucose: 102
Potassium: 4.3 (ref 3.4–5.3)
Sodium: 132 — AB (ref 137–147)

## 2021-08-19 LAB — HEPATIC FUNCTION PANEL
ALT: 27 (ref 10–40)
AST: 44 — AB (ref 14–40)
Alkaline Phosphatase: 127 — AB (ref 25–125)
Bilirubin, Total: 0.8

## 2021-08-19 LAB — COMPREHENSIVE METABOLIC PANEL
Albumin: 3.6 (ref 3.5–5.0)
Calcium: 8.8 (ref 8.7–10.7)

## 2021-08-19 NOTE — Telephone Encounter (Signed)
Per 9/27 LOS next appt scheduled and given to patient

## 2021-08-20 DIAGNOSIS — C7801 Secondary malignant neoplasm of right lung: Secondary | ICD-10-CM | POA: Diagnosis not present

## 2021-08-20 DIAGNOSIS — E119 Type 2 diabetes mellitus without complications: Secondary | ICD-10-CM | POA: Diagnosis not present

## 2021-08-20 DIAGNOSIS — C7949 Secondary malignant neoplasm of other parts of nervous system: Secondary | ICD-10-CM | POA: Diagnosis not present

## 2021-08-20 DIAGNOSIS — C7802 Secondary malignant neoplasm of left lung: Secondary | ICD-10-CM | POA: Diagnosis not present

## 2021-08-20 DIAGNOSIS — C19 Malignant neoplasm of rectosigmoid junction: Secondary | ICD-10-CM | POA: Diagnosis not present

## 2021-08-20 DIAGNOSIS — K7460 Unspecified cirrhosis of liver: Secondary | ICD-10-CM | POA: Diagnosis not present

## 2021-08-25 ENCOUNTER — Telehealth: Payer: Self-pay

## 2021-08-25 ENCOUNTER — Other Ambulatory Visit: Payer: Self-pay | Admitting: Hematology and Oncology

## 2021-08-25 MED ORDER — MORPHINE SULFATE ER 15 MG PO TBCR: 15.0000 mg | EXTENDED_RELEASE_TABLET | Freq: Two times a day (BID) | ORAL | 0 refills | Status: AC

## 2021-08-25 NOTE — Telephone Encounter (Signed)
I spoke with Dakota Bennett. He admits to fall on Sat and Sun morning, scraping his elbow. He was using his walker both times. He also admits his pain medication doesn't last for about couple hours. His pain is mainly in his legs & back. He denies blurred vision, has occasional headache and has confusion. He has been receiving therapy in the home, but states, "I'm probably going to stop it because I hurt worse after I do it".  Pt's wife is concerned that he is having increased falls and uncontrolled pain. He awakes about every 2 hours.

## 2021-08-25 NOTE — Telephone Encounter (Addendum)
I spoke with pt and wife. They both agree to try the long acting Morphine. He wants to hold off on the brain scan for a couple days and let him think it over.  ----- Message from Marvia Pickles, PA-C sent at 08/25/2021 11:24 AM EDT ----- Regarding: RE: Uncontrolled pain, falls x 2 over the weekend (even with use of walker) We can start long acting morphine if agreeable. I would recommend a brain scan if he is willing. I have to go to [facility] now. Will check messages asap. Thanks ----- Message ----- From: [name], RN Sent: 08/25/2021  11:17 AM EDT To: Marvia Pickles, PA-C Subject: Uncontrolled pain, falls x 2 over the weeken#   Pt's wife is concerned that he is having increased falls and uncontrolled pain. He awakes about every 2 hours. I spoke with Lynann Bologna. He admits to fall on Sat and Sun morning, scraping his elbow. He was using his walker both times. He also admits his pain medication doesn't last for about couple hours. His pain is mainly in his legs & back. He denies blurred vision, has occasional headache and has confusion. He has been receiving therapy in the home, but states, "I'm probably going to stop it because I hurt worse after I do it".   Please advise.

## 2021-08-26 ENCOUNTER — Encounter: Payer: Self-pay | Admitting: Oncology

## 2021-08-26 ENCOUNTER — Telehealth: Payer: Self-pay

## 2021-08-26 DIAGNOSIS — E119 Type 2 diabetes mellitus without complications: Secondary | ICD-10-CM | POA: Diagnosis not present

## 2021-08-26 DIAGNOSIS — C7949 Secondary malignant neoplasm of other parts of nervous system: Secondary | ICD-10-CM | POA: Diagnosis not present

## 2021-08-26 DIAGNOSIS — C7802 Secondary malignant neoplasm of left lung: Secondary | ICD-10-CM | POA: Diagnosis not present

## 2021-08-26 DIAGNOSIS — K7460 Unspecified cirrhosis of liver: Secondary | ICD-10-CM | POA: Diagnosis not present

## 2021-08-26 DIAGNOSIS — C19 Malignant neoplasm of rectosigmoid junction: Secondary | ICD-10-CM | POA: Diagnosis not present

## 2021-08-26 DIAGNOSIS — C7801 Secondary malignant neoplasm of right lung: Secondary | ICD-10-CM | POA: Diagnosis not present

## 2021-08-26 NOTE — Telephone Encounter (Signed)
Melissa P, NP:   Called spouse back, going to try and get Urine for analysis.  Corene Cornea, from [facility] was in the home doing his physical therapy.  He suggested a Nurse eval from home health.  I gave verbal order for this to be done.  He will put in referral and have nurse to home today to collect urine.  Almira Bar, NP approved this plan and order.

## 2021-08-27 ENCOUNTER — Telehealth: Payer: Self-pay

## 2021-08-27 DIAGNOSIS — E119 Type 2 diabetes mellitus without complications: Secondary | ICD-10-CM | POA: Diagnosis not present

## 2021-08-27 DIAGNOSIS — C19 Malignant neoplasm of rectosigmoid junction: Secondary | ICD-10-CM | POA: Diagnosis not present

## 2021-08-27 DIAGNOSIS — K7460 Unspecified cirrhosis of liver: Secondary | ICD-10-CM | POA: Diagnosis not present

## 2021-08-27 DIAGNOSIS — C7801 Secondary malignant neoplasm of right lung: Secondary | ICD-10-CM | POA: Diagnosis not present

## 2021-08-27 DIAGNOSIS — C7802 Secondary malignant neoplasm of left lung: Secondary | ICD-10-CM | POA: Diagnosis not present

## 2021-08-27 DIAGNOSIS — C7949 Secondary malignant neoplasm of other parts of nervous system: Secondary | ICD-10-CM | POA: Diagnosis not present

## 2021-08-27 DIAGNOSIS — R300 Dysuria: Secondary | ICD-10-CM | POA: Diagnosis not present

## 2021-08-27 DIAGNOSIS — R34 Anuria and oliguria: Secondary | ICD-10-CM | POA: Diagnosis not present

## 2021-08-27 NOTE — Telephone Encounter (Signed)
Per Lenna Sciara, NP., no antibiotics needed based on urinalysis.    Suha Schoenbeck, RN:  I left Sharyn Lull @ [facility] a message letting her know the above and to let us know what the result of the c/s is.

## 2021-08-27 NOTE — Telephone Encounter (Signed)
Dr. Hinton Rao approved all requested orders.  I notified Elmyra Ricks @ [facility] and gave the verbal orders.

## 2021-08-28 DIAGNOSIS — C7949 Secondary malignant neoplasm of other parts of nervous system: Secondary | ICD-10-CM | POA: Diagnosis not present

## 2021-08-28 DIAGNOSIS — C7802 Secondary malignant neoplasm of left lung: Secondary | ICD-10-CM | POA: Diagnosis not present

## 2021-08-28 DIAGNOSIS — C7801 Secondary malignant neoplasm of right lung: Secondary | ICD-10-CM | POA: Diagnosis not present

## 2021-08-28 DIAGNOSIS — K7460 Unspecified cirrhosis of liver: Secondary | ICD-10-CM | POA: Diagnosis not present

## 2021-08-28 DIAGNOSIS — C19 Malignant neoplasm of rectosigmoid junction: Secondary | ICD-10-CM | POA: Diagnosis not present

## 2021-08-28 DIAGNOSIS — E119 Type 2 diabetes mellitus without complications: Secondary | ICD-10-CM | POA: Diagnosis not present

## 2021-08-29 ENCOUNTER — Other Ambulatory Visit: Payer: Self-pay | Admitting: Hematology and Oncology

## 2021-08-29 ENCOUNTER — Telehealth: Payer: Self-pay

## 2021-08-29 ENCOUNTER — Encounter: Payer: Self-pay | Admitting: Oncology

## 2021-08-29 DIAGNOSIS — C7949 Secondary malignant neoplasm of other parts of nervous system: Secondary | ICD-10-CM | POA: Diagnosis not present

## 2021-08-29 DIAGNOSIS — E119 Type 2 diabetes mellitus without complications: Secondary | ICD-10-CM | POA: Diagnosis not present

## 2021-08-29 DIAGNOSIS — K7460 Unspecified cirrhosis of liver: Secondary | ICD-10-CM | POA: Diagnosis not present

## 2021-08-29 DIAGNOSIS — E559 Vitamin D deficiency, unspecified: Secondary | ICD-10-CM | POA: Diagnosis not present

## 2021-08-29 DIAGNOSIS — C7802 Secondary malignant neoplasm of left lung: Secondary | ICD-10-CM | POA: Diagnosis not present

## 2021-08-29 DIAGNOSIS — C19 Malignant neoplasm of rectosigmoid junction: Secondary | ICD-10-CM | POA: Diagnosis not present

## 2021-08-29 DIAGNOSIS — C7801 Secondary malignant neoplasm of right lung: Secondary | ICD-10-CM | POA: Diagnosis not present

## 2021-08-29 DIAGNOSIS — N390 Urinary tract infection, site not specified: Secondary | ICD-10-CM | POA: Diagnosis not present

## 2021-08-29 MED ORDER — OXYCODONE HCL 5 MG PO TABS: 5.0000 mg | ORAL_TABLET | ORAL | 0 refills | Status: DC | PRN

## 2021-08-29 NOTE — Telephone Encounter (Signed)
Dr. Hinton Rao aware of [facility] RN visit today.  Elmyra Ricks will fax labs when compelted.

## 2021-08-29 NOTE — Telephone Encounter (Signed)
Spoke with Dr. Hinton Rao, Okay to wait until Tuesday 09/02/2021 for routine RN visit unless issues arise over the weekend.  Verbal PRN order given to [facility] for BMP, CBC and urinalysis w c/s with evaluation deems necessary.

## 2021-08-30 DIAGNOSIS — C7801 Secondary malignant neoplasm of right lung: Secondary | ICD-10-CM | POA: Diagnosis not present

## 2021-08-30 DIAGNOSIS — C7802 Secondary malignant neoplasm of left lung: Secondary | ICD-10-CM | POA: Diagnosis not present

## 2021-08-30 DIAGNOSIS — Z7984 Long term (current) use of oral hypoglycemic drugs: Secondary | ICD-10-CM | POA: Diagnosis not present

## 2021-08-30 DIAGNOSIS — G620 Drug-induced polyneuropathy: Secondary | ICD-10-CM | POA: Diagnosis not present

## 2021-08-30 DIAGNOSIS — T451X5D Adverse effect of antineoplastic and immunosuppressive drugs, subsequent encounter: Secondary | ICD-10-CM | POA: Diagnosis not present

## 2021-08-30 DIAGNOSIS — C7949 Secondary malignant neoplasm of other parts of nervous system: Secondary | ICD-10-CM | POA: Diagnosis not present

## 2021-08-30 DIAGNOSIS — K7460 Unspecified cirrhosis of liver: Secondary | ICD-10-CM | POA: Diagnosis not present

## 2021-08-30 DIAGNOSIS — E119 Type 2 diabetes mellitus without complications: Secondary | ICD-10-CM | POA: Diagnosis not present

## 2021-08-30 DIAGNOSIS — R161 Splenomegaly, not elsewhere classified: Secondary | ICD-10-CM | POA: Diagnosis not present

## 2021-08-30 DIAGNOSIS — C19 Malignant neoplasm of rectosigmoid junction: Secondary | ICD-10-CM | POA: Diagnosis not present

## 2021-08-30 DIAGNOSIS — Z8616 Personal history of COVID-19: Secondary | ICD-10-CM | POA: Diagnosis not present

## 2021-09-01 ENCOUNTER — Other Ambulatory Visit: Payer: Self-pay | Admitting: Hematology and Oncology

## 2021-09-01 DIAGNOSIS — C19 Malignant neoplasm of rectosigmoid junction: Secondary | ICD-10-CM | POA: Diagnosis not present

## 2021-09-01 DIAGNOSIS — C7802 Secondary malignant neoplasm of left lung: Secondary | ICD-10-CM | POA: Diagnosis not present

## 2021-09-01 DIAGNOSIS — K7460 Unspecified cirrhosis of liver: Secondary | ICD-10-CM | POA: Diagnosis not present

## 2021-09-01 DIAGNOSIS — E119 Type 2 diabetes mellitus without complications: Secondary | ICD-10-CM | POA: Diagnosis not present

## 2021-09-01 DIAGNOSIS — C7949 Secondary malignant neoplasm of other parts of nervous system: Secondary | ICD-10-CM | POA: Diagnosis not present

## 2021-09-01 DIAGNOSIS — C7801 Secondary malignant neoplasm of right lung: Secondary | ICD-10-CM | POA: Diagnosis not present

## 2021-09-01 MED ORDER — CIPROFLOXACIN HCL 500 MG PO TABS: 500.0000 mg | ORAL_TABLET | Freq: Two times a day (BID) | ORAL | 0 refills | Status: DC

## 2021-09-02 DIAGNOSIS — C7801 Secondary malignant neoplasm of right lung: Secondary | ICD-10-CM | POA: Diagnosis not present

## 2021-09-02 DIAGNOSIS — C7802 Secondary malignant neoplasm of left lung: Secondary | ICD-10-CM | POA: Diagnosis not present

## 2021-09-02 DIAGNOSIS — K7460 Unspecified cirrhosis of liver: Secondary | ICD-10-CM | POA: Diagnosis not present

## 2021-09-02 DIAGNOSIS — C7949 Secondary malignant neoplasm of other parts of nervous system: Secondary | ICD-10-CM | POA: Diagnosis not present

## 2021-09-02 DIAGNOSIS — C19 Malignant neoplasm of rectosigmoid junction: Secondary | ICD-10-CM | POA: Diagnosis not present

## 2021-09-02 DIAGNOSIS — E119 Type 2 diabetes mellitus without complications: Secondary | ICD-10-CM | POA: Diagnosis not present

## 2021-09-03 DIAGNOSIS — K7460 Unspecified cirrhosis of liver: Secondary | ICD-10-CM | POA: Diagnosis not present

## 2021-09-03 DIAGNOSIS — C19 Malignant neoplasm of rectosigmoid junction: Secondary | ICD-10-CM | POA: Diagnosis not present

## 2021-09-03 DIAGNOSIS — C7801 Secondary malignant neoplasm of right lung: Secondary | ICD-10-CM | POA: Diagnosis not present

## 2021-09-03 DIAGNOSIS — C7949 Secondary malignant neoplasm of other parts of nervous system: Secondary | ICD-10-CM | POA: Diagnosis not present

## 2021-09-03 DIAGNOSIS — C7802 Secondary malignant neoplasm of left lung: Secondary | ICD-10-CM | POA: Diagnosis not present

## 2021-09-03 DIAGNOSIS — E119 Type 2 diabetes mellitus without complications: Secondary | ICD-10-CM | POA: Diagnosis not present

## 2021-09-04 DIAGNOSIS — C7951 Secondary malignant neoplasm of bone: Secondary | ICD-10-CM | POA: Diagnosis not present

## 2021-09-04 DIAGNOSIS — K7460 Unspecified cirrhosis of liver: Secondary | ICD-10-CM | POA: Diagnosis not present

## 2021-09-04 DIAGNOSIS — C20 Malignant neoplasm of rectum: Secondary | ICD-10-CM | POA: Diagnosis not present

## 2021-09-04 DIAGNOSIS — C7800 Secondary malignant neoplasm of unspecified lung: Secondary | ICD-10-CM | POA: Diagnosis not present

## 2021-09-04 DIAGNOSIS — C7801 Secondary malignant neoplasm of right lung: Secondary | ICD-10-CM | POA: Diagnosis not present

## 2021-09-04 DIAGNOSIS — C7802 Secondary malignant neoplasm of left lung: Secondary | ICD-10-CM | POA: Diagnosis not present

## 2021-09-04 DIAGNOSIS — C7949 Secondary malignant neoplasm of other parts of nervous system: Secondary | ICD-10-CM | POA: Diagnosis not present

## 2021-09-04 DIAGNOSIS — E1165 Type 2 diabetes mellitus with hyperglycemia: Secondary | ICD-10-CM | POA: Diagnosis not present

## 2021-09-04 DIAGNOSIS — C19 Malignant neoplasm of rectosigmoid junction: Secondary | ICD-10-CM | POA: Diagnosis not present

## 2021-09-04 DIAGNOSIS — E119 Type 2 diabetes mellitus without complications: Secondary | ICD-10-CM | POA: Diagnosis not present

## 2021-09-08 DIAGNOSIS — C7801 Secondary malignant neoplasm of right lung: Secondary | ICD-10-CM | POA: Diagnosis not present

## 2021-09-08 DIAGNOSIS — C7802 Secondary malignant neoplasm of left lung: Secondary | ICD-10-CM | POA: Diagnosis not present

## 2021-09-08 DIAGNOSIS — K7460 Unspecified cirrhosis of liver: Secondary | ICD-10-CM | POA: Diagnosis not present

## 2021-09-08 DIAGNOSIS — C19 Malignant neoplasm of rectosigmoid junction: Secondary | ICD-10-CM | POA: Diagnosis not present

## 2021-09-08 DIAGNOSIS — E119 Type 2 diabetes mellitus without complications: Secondary | ICD-10-CM | POA: Diagnosis not present

## 2021-09-08 DIAGNOSIS — C7949 Secondary malignant neoplasm of other parts of nervous system: Secondary | ICD-10-CM | POA: Diagnosis not present

## 2021-09-09 DIAGNOSIS — K7460 Unspecified cirrhosis of liver: Secondary | ICD-10-CM | POA: Diagnosis not present

## 2021-09-09 DIAGNOSIS — E119 Type 2 diabetes mellitus without complications: Secondary | ICD-10-CM | POA: Diagnosis not present

## 2021-09-09 DIAGNOSIS — C19 Malignant neoplasm of rectosigmoid junction: Secondary | ICD-10-CM | POA: Diagnosis not present

## 2021-09-09 DIAGNOSIS — C7802 Secondary malignant neoplasm of left lung: Secondary | ICD-10-CM | POA: Diagnosis not present

## 2021-09-09 DIAGNOSIS — C7801 Secondary malignant neoplasm of right lung: Secondary | ICD-10-CM | POA: Diagnosis not present

## 2021-09-09 DIAGNOSIS — C7949 Secondary malignant neoplasm of other parts of nervous system: Secondary | ICD-10-CM | POA: Diagnosis not present

## 2021-09-10 ENCOUNTER — Telehealth: Payer: Self-pay

## 2021-09-10 ENCOUNTER — Other Ambulatory Visit: Payer: Self-pay | Admitting: Hematology and Oncology

## 2021-09-10 DIAGNOSIS — K7460 Unspecified cirrhosis of liver: Secondary | ICD-10-CM | POA: Diagnosis not present

## 2021-09-10 DIAGNOSIS — C19 Malignant neoplasm of rectosigmoid junction: Secondary | ICD-10-CM | POA: Diagnosis not present

## 2021-09-10 DIAGNOSIS — C7802 Secondary malignant neoplasm of left lung: Secondary | ICD-10-CM | POA: Diagnosis not present

## 2021-09-10 DIAGNOSIS — E119 Type 2 diabetes mellitus without complications: Secondary | ICD-10-CM | POA: Diagnosis not present

## 2021-09-10 DIAGNOSIS — C7949 Secondary malignant neoplasm of other parts of nervous system: Secondary | ICD-10-CM | POA: Diagnosis not present

## 2021-09-10 DIAGNOSIS — C7801 Secondary malignant neoplasm of right lung: Secondary | ICD-10-CM | POA: Diagnosis not present

## 2021-09-10 MED ORDER — LEVOFLOXACIN 500 MG PO TABS: 500.0000 mg | ORAL_TABLET | Freq: Every day | ORAL | 0 refills | Status: DC

## 2021-09-10 MED ORDER — OXYCODONE HCL 5 MG PO TABS: 5.0000 mg | ORAL_TABLET | ORAL | 0 refills | Status: AC | PRN

## 2021-09-10 NOTE — Telephone Encounter (Signed)
Called wife regarding a message from Dillon Bjork, physical therapy assistant with [facility] home health. Wife states patient does have a UTI. Having trouble walking with walker. Stepping on his own feet. Feet are crossing over each other. C/O feet hurting all the time. Wife can not tell if pain medication is working due to patients condition. Patient is not sleeping good. Talking out of his head. He is taking Morphine and Oxycodone and it does not help him rest. Wife is scared and afraid that something bad is going to happen. Patient does eat but not much at a time. Had lost more weight.

## 2021-09-10 NOTE — Telephone Encounter (Addendum)
I notified Dakota Bennett of below. She verbalized understanding. She will update her sis, Dakota Bennett as well. She states her mom is feeling overwhelmed, so they are helping as much as they can.    RE: Symptomatic still - UTI Received: Today [name], NP  [name], RN Just sent levaquin and oxycodone. The micro shows sensitive to levaquin.   RE: Symptomatic still - UTI Received: Today [name], NP  [name], RN I will send antibiotic and refill pain meds for oxycodone 5 mg that he can take 2 if needed.   @ 47 - Pt's other daughter, Dakota Bennett, LVM on nurse line@ 1519.  She states the same thing as Dakota Bennett. That her dad is confused and having pain, req more antibiotic.  RE: Symptomatic still - UTI Received: Today [name], NP  [name], RN Ok. Would he like something for pain as well? Has the home health nurse seen him lately?   @ 1230- I spoke with pt's spouse. Pt is afebrile. She confirmed the symptoms below that [name], their daughter, relayed . His wife mentions he also complains of rectal pain and his feet hurting. Please advise.    @1157 - Pt's dtr, Dakota Bennett, called to notify us that she doesn't think her dad has cleared the UTI. He was very confused last night (he never is confused). Monday night he was having considerable amount of abdominal pain. She wonders if he could be given another antibiotic?

## 2021-09-11 ENCOUNTER — Telehealth: Payer: Self-pay

## 2021-09-11 ENCOUNTER — Other Ambulatory Visit: Payer: Self-pay | Admitting: Oncology

## 2021-09-11 DIAGNOSIS — C701 Malignant neoplasm of spinal meninges: Secondary | ICD-10-CM

## 2021-09-11 DIAGNOSIS — C20 Malignant neoplasm of rectum: Secondary | ICD-10-CM

## 2021-09-11 NOTE — Telephone Encounter (Signed)
Wife Santiago Glad is requesting scans to be done before next appointments. Will discuss hospice after scans and at another time.

## 2021-09-12 DIAGNOSIS — R0602 Shortness of breath: Secondary | ICD-10-CM | POA: Diagnosis not present

## 2021-09-12 DIAGNOSIS — C19 Malignant neoplasm of rectosigmoid junction: Secondary | ICD-10-CM | POA: Diagnosis not present

## 2021-09-12 DIAGNOSIS — C7802 Secondary malignant neoplasm of left lung: Secondary | ICD-10-CM | POA: Diagnosis not present

## 2021-09-12 DIAGNOSIS — C7800 Secondary malignant neoplasm of unspecified lung: Secondary | ICD-10-CM | POA: Diagnosis not present

## 2021-09-12 DIAGNOSIS — K7460 Unspecified cirrhosis of liver: Secondary | ICD-10-CM | POA: Diagnosis not present

## 2021-09-12 DIAGNOSIS — J189 Pneumonia, unspecified organism: Secondary | ICD-10-CM | POA: Diagnosis not present

## 2021-09-12 DIAGNOSIS — C7801 Secondary malignant neoplasm of right lung: Secondary | ICD-10-CM | POA: Diagnosis not present

## 2021-09-12 DIAGNOSIS — C7949 Secondary malignant neoplasm of other parts of nervous system: Secondary | ICD-10-CM | POA: Diagnosis not present

## 2021-09-12 DIAGNOSIS — C20 Malignant neoplasm of rectum: Secondary | ICD-10-CM | POA: Diagnosis not present

## 2021-09-12 DIAGNOSIS — E119 Type 2 diabetes mellitus without complications: Secondary | ICD-10-CM | POA: Diagnosis not present

## 2021-09-12 NOTE — Progress Notes (Incomplete)
[facility]  [address] [place],  [place]  [address] [phone]  Clinic Day:  08/19/2021  Referring physician: Serita Grammes, MD   This document serves as a record of services personally performed by Hosie Poisson, MD. It was created on their behalf by Curry,Lauren E, a trained medical scribe. The creation of this record is based on the scribe's personal observations and the provider's statements to them.  CHIEF COMPLAINT:  CC: Stage IIIA rectosigmoid adenocarcinoma   Current Treatment:  Observation   HISTORY OF PRESENT ILLNESS:  Dakota Bennett is a 75 y.o. 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 This was found because of a positive Cologuard test, but he did have some rectal bleeding for several months.  Colonoscopy revealed an ulcerated mass at the proximal rectum at 9 cm and extending from the proximal rectum to the rectosigmoid junction.  An endoscopic ultrasound was done and confirmed a nonobstructing medium-sized mass of the proximal rectum involving 2/3 of the circumference and measuring 3 cm long, occurring 10 cm from the anal verge.  He also had 2 rounded suspicious perirectal lymph nodes measuring 5 and 6 mm.  CT scans of the abdomen and pelvis revealed several rounded nodules at the lung bases, which were tiny and largely unchanged from prior scans, so felt to likely be benign. CEA was elevated at 28.3.  He received neoadjuvant chemoradiation with 5 fluorouracil infusion for the 1st and 5th week of radiation and completed treatment the end of June.  He tolerated this fairly well, but had severe dysuria, as well as urinary hesitancy and soft stools.  CT chest revealed several pulmonary nodules, which had enlarged.  There was generalized wall thickening in the rectum, which could be due to the radiotherapy.  He underwent PET scan, which revealed increased uptake in  the pulmonary nodules felt to be consistent with metastatic disease.  There was no evidence of persistence of disease in the rectum or other areas of metastasis.  His surgical plans were canceled.  The CEA was normal at that time. We obtained KRAS testing on his tumor, which did show a mutation.  MMR testing of the tumor was normal.  We recommended palliative chemotherapy with FOLFOX/bevacizumab.  The patient had persistent thrombocytopenia, so we reduced his chemotherapy doses by a total of 25%.  Repeat imaging after 6 cycles of FOLFOX/bevacizumab was overall stable, there was some increase in the larger pulmonary nodules, however, they appeared necrotic indicating a response to therapy.   After 12 cycles of FOLFOX/bevacizumab restaging scans were obtained that showed the dominant left lower lobe lesion had substantially decreased in size and no new or progressive pulmonary nodule or masses were seen.  Oxaliplatin was discontinued due to neuropathy in January 2019.  Repeat CT imaging after 20 cycles revealed stable to decreased pulmonary nodules with no other evidence of metastasis.    He was continued on palliative chemotherapy with occasional changes in dosing and schedule.  He has had several episodes of rectal bleeding.  Dr. Lyndel Safe performed sigmoidoscopy in March 2020 which did not reveal any evidence of disease in the rectum.  He had worsening neuropathy of the feet, so was started on gabapentin 300 mg at bedtime and the dose was increased but later stopped.  While having a 39th cycle of infusional 5-fluorouracil, he contacted [place] to report episodes of chest pain since starting his treatment with radiation down  the arms and an episode of emesis.  He was referred to the emergency department and found to have had a non STEM in March 2020I.  His 5 FU chemotherapy pump was discontinued.  He was transferred to [facility] for further evaluation and treatment, and had 3 stents placed in early April. Per Dr.  Joya Gaskins telehealth note, the patient had residual LAD disease and recommended he be treated medically unless he has refractory angina, as he would require atherectomy of a proximal LAD lesion.  Dr. Bettina Gavia recommended discontinuing the gabapentin.  He placed the patient back on magnesium oxide 400 mg daily.  He was also started on clopidogrel 75 mg daily, as well as isosorbide mononitrate 30 mg once a day, amlodipine 5 mg once a day, atorvastatin 80 mg once a day and aspirin 81 mg daily.  Chemotherapy was held due to his recent non STEMI and resumed in May 2020.  Bevacizumab was discontinued due to recurrent rectal bleeding in June 2020 and the dose of chemotherapy was reduced by 10% due to worsening thrombocytopenia.  CT imaging in July 2020 revealed a mild interval increase in size of the extensive bilateral pulmonary nodules, as well as increased wall thickening of the rectum.  He also was found to have increasing splenomegaly.  His hemoglobin had also dropped to 7.8, and his platelet count had dropped to 80,000.  We therefore stopped his treatment due to progressive disease, as well as worsening anemia and thrombocytopenia.  He had evidence of recurrent iron deficiency, so received IV iron in July with improvement in his anemia.  Since July of 2020 he has had slow steady progression of his lung metastases but has remained asymptomatic.  His CEA has steadily increased as well.  CT chest, abdomen and pelvis from October 2021 revealed mild progression of the diffuse bilateral pulmonary metastases and a new 11 mm left mediastinal lymph node.  Index nodule in the medial left lower lobe currently measures 3.4 x 2.7 cm, compared to 2.8 x 2.2 cm previously.  Otherwise, the exam was stable.  CEA from October was 457.0.  Dakota Bennett presented to [facility] on November 22nd due to 5 episodes of bright red blood per rectum with dark clotting that day and was admitted.  His hemoglobin dropped down to 6 during his stay  and he received 2 units of PRBCs on November 24th.  He was advised to hold Plavix.  He underwent a flexible sigmoidoscopy on November 25th revealing a single small ulcer with a prominent visble vessel in the distal rectum. For hemostasis, one hemostatic clip was successfully placed. There was no bleeding at the end of the procedure.  A fungating and ulcerated non-obstructing mass was found in the rectum. The mass was traversable, circumferential and measured five cm in length. There was mild contact oozing present. This was biopsied and surgical pathology was consistent with adenocarcinoma.  The sigmoid colon mucosa proximal to the mass was otherwise normal appearing.  Non-bleeding small internal hemorrhoids were found during retroflexion.  His hemoglobin was up to 9.3 when I saw him in December.  At that time, we discussed options of palliation and referred him to Dr. Orlene Erm for some additional radiation.  He had COVID infection is December 2021.  Han completed a 3250 cGy course of radiation, a total of 13 fractions, on January 21st 2022.  CT imaging from February revealed numerous pulmonary masses and nodules, which are significantly increased in size, consistent with worsened pulmonary metastatic disease.  An index mass of the dependent left lower lobe measures 4.0 x 3.2 cm, previously 2.8 x 2.5 cm. Another index mass of the subpleural right upper lobe measures 3.6 x 2.9 cm, previously 2.4 x 2.4 cm.  There is also interval enlargement of a hypodense AP window lymph node now measuring 1.9 x 1.4 cm, previously 1.8 x 1.1 cm, consistent with worsened nodal metastatic disease.  No significant interval change in the circumferential wall thickening of the distal sigmoid colon and rectum with adjacent fat stranding in the low pelvis, consistent with post treatment appearance of primary colorectal malignancy.  CT imaging from June revealed mildly increased size to some pulmonary nodules; similar appearance of  circumferential wall thickening of the distal sigmoid colon and rectum with surrounding soft tissue stranding; no sign of abdominopelvic adenopathy or solid organ metastasis within the abdomen.   In July 2022, MRI of the lumber spine revealed intradural tumor. The most prominent finding is a 2.8 x 1.5 x 1.9 cm intradural mass at the L1-L2 region, filling the thecal sac and displacing the nerves, more towards the left. There are some other tiny enhancing nodules within the other nerves of the cauda equina and there is some enhancement at the tip of the thecal sac.   CTA of the chest and CT abdomen and pelvis were stable, and revealed no further metastatic disease, but he does still have chronic thickening of the rectal wall.  He has received radiation therapy to the lumbar spine.    INTERVAL HISTORY:  Yedidya is here for routine follow up. He rates his rectal pain as a 3/10. His back pain is nearly resolved and he states that this only occurs intermittently. He continues to use a walker to ambulate at home, but comes into the clinic in a wheelchair. He continues physical therapy twice weekly with improvement. He still has some lower extremity weakness, worse on the right. Hemoglobin is mildly improved from 11.5 to 11.9, platelet count has decreased from 145,000 to 107,000, and white count is normal. Chemistries are unremarkable except for a sodium of 132. His  appetite is fair but he notes early satiety, and he has lost another 7 pounds since his last visit.  He denies fever, chills or other signs of infection.  He denies nausea, vomiting, bowel issues, or abdominal pain.  He denies sore throat, cough, dyspnea, or chest pain.  [place] is here for continued supportive care.   His  appetite is good, and he has gained/lost _ pounds since his last visit.  He denies fever, chills or other signs of infection.  He denies nausea, vomiting, bowel issues, or abdominal pain.  He denies sore throat, cough, dyspnea, or chest  pain.  REVIEW OF SYSTEMS:  Review of Systems  Constitutional: Negative.  Negative for appetite change, chills, fatigue, fever and unexpected weight change.  HENT:  Negative.    Eyes: Negative.   Respiratory: Negative.  Negative for chest tightness, cough, hemoptysis, shortness of breath and wheezing.   Cardiovascular: Negative.  Negative for chest pain, leg swelling and palpitations.  Gastrointestinal: Negative.  Negative for abdominal distention, abdominal pain, blood in stool, constipation, diarrhea, nausea and vomiting.  Endocrine: Negative.   Genitourinary: Negative.  Negative for difficulty urinating, dysuria, frequency and hematuria.   Musculoskeletal: Negative.  Negative for arthralgias, back pain, flank pain, gait problem and myalgias.  Skin: Negative.   Neurological: Negative.  Negative for dizziness, extremity weakness, gait problem, headaches, light-headedness, numbness, seizures and speech difficulty.  Hematological:  Negative.   Psychiatric/Behavioral: Negative.  Negative for depression and sleep disturbance. The patient is not nervous/anxious.   All other systems reviewed and are negative.   VITALS:  Blood pressure (!) 146/66, pulse 62, temperature 97.8 F (36.6 C), temperature source Oral, resp. rate 18, height 5' 10.5" (1.791 m), weight 160 lb 14.4 oz (73 kg), SpO2 95 %.  Wt Readings from Last 3 Encounters:  08/19/21 160 lb 14.4 oz (73 kg)  07/17/21 168 lb (76.2 kg)  05/08/21 181 lb 3.2 oz (82.2 kg)    Body mass index is 22.76 kg/m.  Performance status (ECOG): 2 - Symptomatic, <50% confined to bed  PHYSICAL EXAM:  Physical Exam Constitutional:      General: He is not in acute distress.    Appearance: Normal appearance. He is normal weight.  HENT:     Head: Normocephalic and atraumatic.  Eyes:     General: No scleral icterus.    Extraocular Movements: Extraocular movements intact.     Conjunctiva/sclera: Conjunctivae normal.     Pupils: Pupils are equal,  round, and reactive to light.  Cardiovascular:     Rate and Rhythm: Normal rate and regular rhythm.     Pulses: Normal pulses.     Heart sounds: Normal heart sounds. No murmur heard.   No friction rub. No gallop.  Pulmonary:     Effort: Pulmonary effort is normal. No respiratory distress.     Breath sounds: Normal breath sounds.  Abdominal:     General: Bowel sounds are normal. There is no distension.     Palpations: Abdomen is soft. There is no hepatomegaly, splenomegaly or mass.     Tenderness: There is no abdominal tenderness.  Musculoskeletal:        General: Normal range of motion.     Cervical back: Normal range of motion and neck supple.     Right lower leg: No edema.     Left lower leg: No edema.  Lymphadenopathy:     Cervical: No cervical adenopathy.  Skin:    General: Skin is warm and dry.  Neurological:     General: No focal deficit present.     Mental Status: He is alert and oriented to person, place, and time. Mental status is at baseline.  Psychiatric:        Mood and Affect: Mood normal.        Behavior: Behavior normal.        Thought Content: Thought content normal.        Judgment: Judgment normal.   LABS:   CBC Latest Ref Rng & Units 08/19/2021 07/17/2021 04/29/2021  WBC - 6.0 5.3 6.3  Hemoglobin 13.5 - 17.5 11.9(A) 11.5(A) 12.9(A)  Hematocrit 41 - 53 36(A) 34(A) 40(A)  Platelets 150 - 399 107(A) 145(A) 97(A)   CMP Latest Ref Rng & Units 08/19/2021 07/17/2021 04/29/2021  Glucose 70 - 99 mg/dL - - -  BUN 4 - _0 Creatinine 0.6 - 1.3 0.6 0.6 0.6  Sodium 137 - 147 132(A) 134(A) 135(A)  Potassium 3.4 - 5.3 4.3 4.1 5.0  Chloride 99 - 108 101 102 105  CO2 13 - 22 23(A) 22 23(A)  Calcium 8.7 - 10.7 8.8 8.7 9.0  Total Protein 6.5 - 8.1 g/dL - - -  Total Bilirubin 0.3 - 1.2 mg/dL - - -  Alkaline Phos 25 - 125 127(A) 181(A) 136(A)  AST 14 - 40 44(A) 34 30  ALT 10 - 40 27 28  23     Lab Results  Component Value Date   CEA1 442.0 (H) 04/29/2021   /   CEA  Date Value Ref Range Status  04/29/2021 442.0 (H) 0.0 - 4.7 ng/mL Final    Comment:    (NOTE)                             Nonsmokers          <3.9                             Smokers             <5.6 [employer] Electrochemiluminescence Immunoassay (ECLIA) Values obtained with different assay methods or kits cannot be used interchangeably.  Results cannot be interpreted as absolute evidence of the presence or absence of malignant disease. Performed At: [facility] [place], [place] [id number] Rush Farmer MD VZ:[id number]      STUDIES:   Allergies:  Allergies  Allergen Reactions   Lisinopril Hives        Adhesive [Tape] Rash    Bandaids   Aspirin Other (See Comments)    Burns stomach    Neosporin [Neomycin-Bacitracin Zn-Polymyx] Rash    Current Medications: Current Outpatient Medications  Medication Sig Dispense Refill   diphenoxylate-atropine (LOMOTIL) 2.5-0.025 MG tablet Take 1-2 tablets by mouth 4 (four) times daily as needed for diarrhea or loose stools.     doxycycline (VIBRA-TABS) 100 MG tablet Take 100 mg by mouth as needed (rash).     famotidine (PEPCID) 20 MG tablet Take 20 mg by mouth daily.     gabapentin (NEURONTIN) 300 MG capsule TAKE 1 CAPSULE BY MOUTH TWICE A DAY 60 capsule 5   minoxidil (LONITEN) 2.5 MG tablet Take 5 mg by mouth 2 (two) times daily.     nitroGLYCERIN (NITROSTAT) 0.4 MG SL tablet Place 0.4 mg under the tongue every 5 (five) minutes as needed for chest pain.     oxyCODONE (OXY IR/ROXICODONE) 5 MG immediate release tablet Take 1 tablet (5 mg total) by mouth every 4 (four) hours as needed for severe pain. 120 tablet 0   Potassium Chloride ER 20 MEQ TBCR Take 1 tablet by mouth daily.     pravastatin (PRAVACHOL) 20 MG tablet Take 1 tablet (20 mg total) by mouth every evening. 90 tablet 3   rOPINIRole (REQUIP) 0.25 MG tablet Take 0.25 mg by mouth at bedtime.     No current facility-administered  medications for this visit.     ASSESSMENT & PLAN:   Assessment:   1. Metastatic colorectal cancer to the lung.  He had mild progression of disease on palliative infusional 5 fluorouracil/leucovorin/bevacizumab every 3 weeks, so was switched back to infusional 5 fluorouracil/leucovorin/bevacizumab every 2 weeks.  Bevacizumab was discontinued in May 2020 due to rectal bleeding. In July 2020 chemotherapy was discontinued.  He has had continued slow steady progression of his disease on imaging, as well as an increasing CEA.    2.  Rectal cancer which has not been resected due to the metastatic disease.  In November 2021 sigmoidoscopy confirmed a 5 cm fungating and ulcerated non-obstructing mass in the rectum and biopsy was consistent with adenocarcinoma.  Therefore, he has received palliative radiation x2.   3. Thrombocytopenia, secondary to chemotherapy and splenomegaly.  4. Splenomegaly and liver cirrhosis on CT imaging, which is likely contributing to  his thrombocytopenia.  Physical exam is benign  5.  Intradural metastases.  The most prominent finding is a 2.8 x 1.5 x 1.9 cm intradural mass at the L1-L2 region, filling the thecal sac and displacing the nerves, more towards the left. There are some other tiny enhancing nodules within the other nerves of the cauda equina and there is some enhancement at the tip of the thecal sac. We will not pursue further chemotherapy and will focus on quality of life. He has received palliative radiation.  6.  Poor appetite and weight loss.  This is likely secondary to his malignancy.  We reviewed possible appetite stimulant with Remeron or corticosteroids, but will hold off for now.  7.  Lower extremity weakness, he is using a walker and continuing physical therapy.  8.  His wife notes that he has some decrease in memory and is repeating himself. He denies headaches, blurred vision or other neurologic symptoms. He is certainly at higher risk for brain  metastasis due to the dural involvement of the lumbar spine. At this time, I do not have a strong suspicion of brain metastases and so we will hold off on brain imaging.  Plan: He continues physical therapy and is making good improvement, however, I still doubt that he would be able to pursue further chemotherapy in his future. I explained that we do not want to degrade his quality of life, and his quality of life currently is fair. He will continue with physical therapy twice weekly. If he declines, we could consider Hospice services but at this time there are no symptoms to manage.  We will see him back in 1 month for repeat CBC, CMP and port flush.  He does have a DNR in place. He verbalizes understanding of and agreement to the plans discussed today. He knows to call the office should any new questions or concerns arise.    Derwood Kaplan, MD [facility] [address] [place] [address] Dept: [phone] Dept Fax: [phone]   I, Rita Ohara, am acting as scribe for Derwood Kaplan, MD  I have reviewed this report as typed by the medical scribe, and it is complete and accurate.

## 2021-09-15 DIAGNOSIS — C7801 Secondary malignant neoplasm of right lung: Secondary | ICD-10-CM | POA: Diagnosis not present

## 2021-09-15 DIAGNOSIS — C7802 Secondary malignant neoplasm of left lung: Secondary | ICD-10-CM | POA: Diagnosis not present

## 2021-09-15 DIAGNOSIS — C7949 Secondary malignant neoplasm of other parts of nervous system: Secondary | ICD-10-CM | POA: Diagnosis not present

## 2021-09-15 DIAGNOSIS — E119 Type 2 diabetes mellitus without complications: Secondary | ICD-10-CM | POA: Diagnosis not present

## 2021-09-15 DIAGNOSIS — C19 Malignant neoplasm of rectosigmoid junction: Secondary | ICD-10-CM | POA: Diagnosis not present

## 2021-09-15 DIAGNOSIS — K7460 Unspecified cirrhosis of liver: Secondary | ICD-10-CM | POA: Diagnosis not present

## 2021-09-16 DIAGNOSIS — E119 Type 2 diabetes mellitus without complications: Secondary | ICD-10-CM | POA: Diagnosis not present

## 2021-09-16 DIAGNOSIS — C7801 Secondary malignant neoplasm of right lung: Secondary | ICD-10-CM | POA: Diagnosis not present

## 2021-09-16 DIAGNOSIS — C7802 Secondary malignant neoplasm of left lung: Secondary | ICD-10-CM | POA: Diagnosis not present

## 2021-09-16 DIAGNOSIS — C7949 Secondary malignant neoplasm of other parts of nervous system: Secondary | ICD-10-CM | POA: Diagnosis not present

## 2021-09-16 DIAGNOSIS — C19 Malignant neoplasm of rectosigmoid junction: Secondary | ICD-10-CM | POA: Diagnosis not present

## 2021-09-16 DIAGNOSIS — K7460 Unspecified cirrhosis of liver: Secondary | ICD-10-CM | POA: Diagnosis not present

## 2021-09-16 NOTE — Progress Notes (Signed)
Received a call from [place] with [facility], she stated that family was interested in transportation. I called and spoke with patients wife and they do not need transportation at this time, but took my number if this is needed in the future.

## 2021-09-17 ENCOUNTER — Encounter: Payer: Self-pay | Admitting: Oncology

## 2021-09-17 DIAGNOSIS — K766 Portal hypertension: Secondary | ICD-10-CM | POA: Diagnosis not present

## 2021-09-17 DIAGNOSIS — D7389 Other diseases of spleen: Secondary | ICD-10-CM | POA: Diagnosis not present

## 2021-09-17 DIAGNOSIS — C701 Malignant neoplasm of spinal meninges: Secondary | ICD-10-CM | POA: Diagnosis not present

## 2021-09-17 DIAGNOSIS — N3289 Other specified disorders of bladder: Secondary | ICD-10-CM | POA: Diagnosis not present

## 2021-09-17 DIAGNOSIS — I2510 Atherosclerotic heart disease of native coronary artery without angina pectoris: Secondary | ICD-10-CM | POA: Diagnosis not present

## 2021-09-17 DIAGNOSIS — C7951 Secondary malignant neoplasm of bone: Secondary | ICD-10-CM | POA: Diagnosis not present

## 2021-09-17 DIAGNOSIS — C20 Malignant neoplasm of rectum: Secondary | ICD-10-CM | POA: Diagnosis not present

## 2021-09-17 DIAGNOSIS — C7800 Secondary malignant neoplasm of unspecified lung: Secondary | ICD-10-CM | POA: Diagnosis not present

## 2021-09-17 DIAGNOSIS — K7460 Unspecified cirrhosis of liver: Secondary | ICD-10-CM | POA: Diagnosis not present

## 2021-09-17 LAB — CBC AND DIFFERENTIAL
HCT: 40 — AB (ref 41–53)
Hemoglobin: 13.1 — AB (ref 13.5–17.5)
Neutrophils Absolute: 6.63
Platelets: 133 — AB (ref 150–399)
WBC: 7.8

## 2021-09-17 LAB — BASIC METABOLIC PANEL
BUN: 8 (ref 4–21)
CO2: 28 — AB (ref 13–22)
Chloride: 96 — AB (ref 99–108)
Creatinine: 0.5 — AB (ref 0.6–1.3)
Glucose: 138
Potassium: 4.6 (ref 3.4–5.3)
Sodium: 132 — AB (ref 137–147)

## 2021-09-17 LAB — CBC: RBC: 4.57 (ref 3.87–5.11)

## 2021-09-17 LAB — COMPREHENSIVE METABOLIC PANEL
Albumin: 4 (ref 3.5–5.0)
Calcium: 9 (ref 8.7–10.7)

## 2021-09-17 LAB — HEPATIC FUNCTION PANEL
ALT: 145 — AB (ref 10–40)
AST: 232 — AB (ref 14–40)
Alkaline Phosphatase: 188 — AB (ref 25–125)
Bilirubin, Total: 1.4

## 2021-09-18 ENCOUNTER — Telehealth: Payer: Self-pay

## 2021-09-18 ENCOUNTER — Other Ambulatory Visit: Payer: Medicare Other

## 2021-09-18 ENCOUNTER — Ambulatory Visit: Payer: Medicare Other | Admitting: Oncology

## 2021-09-18 DIAGNOSIS — C7949 Secondary malignant neoplasm of other parts of nervous system: Secondary | ICD-10-CM | POA: Diagnosis not present

## 2021-09-18 DIAGNOSIS — K7460 Unspecified cirrhosis of liver: Secondary | ICD-10-CM | POA: Diagnosis not present

## 2021-09-18 DIAGNOSIS — C19 Malignant neoplasm of rectosigmoid junction: Secondary | ICD-10-CM | POA: Diagnosis not present

## 2021-09-18 DIAGNOSIS — E119 Type 2 diabetes mellitus without complications: Secondary | ICD-10-CM | POA: Diagnosis not present

## 2021-09-18 DIAGNOSIS — C7802 Secondary malignant neoplasm of left lung: Secondary | ICD-10-CM | POA: Diagnosis not present

## 2021-09-18 DIAGNOSIS — C7801 Secondary malignant neoplasm of right lung: Secondary | ICD-10-CM | POA: Diagnosis not present

## 2021-09-18 NOTE — Telephone Encounter (Addendum)
Nicole,RN returned my call. Pt doe have a DNR in the home.  RE: chest pain - Hospice pt Received: Today Mosher, Thalia Bloodgood, PA-C  Cydney Alvarenga W, RN I haven't seen him recently. Does he not have DNR? Would hospice discuss if not?          Toiya Morrish,RN: Received call from Hospice nurse, Nicholes Mango.  She wanted to give [place] an update. Pt had chest pain last night, relieved with NTG. Today the bath aide called to report he has had another episode. Elmyra Ricks, RN, spoke with pt's wife and told her she can repeat the NTG up to 3 times. If chest pain persists they need to call 911.

## 2021-09-19 ENCOUNTER — Telehealth: Payer: Self-pay

## 2021-09-19 DIAGNOSIS — G9389 Other specified disorders of brain: Secondary | ICD-10-CM | POA: Diagnosis not present

## 2021-09-19 DIAGNOSIS — C701 Malignant neoplasm of spinal meninges: Secondary | ICD-10-CM | POA: Diagnosis not present

## 2021-09-19 DIAGNOSIS — C800 Disseminated malignant neoplasm, unspecified: Secondary | ICD-10-CM | POA: Diagnosis not present

## 2021-09-19 DIAGNOSIS — N3289 Other specified disorders of bladder: Secondary | ICD-10-CM | POA: Diagnosis not present

## 2021-09-19 DIAGNOSIS — C20 Malignant neoplasm of rectum: Secondary | ICD-10-CM | POA: Diagnosis not present

## 2021-09-19 DIAGNOSIS — C7931 Secondary malignant neoplasm of brain: Secondary | ICD-10-CM | POA: Diagnosis not present

## 2021-09-19 DIAGNOSIS — C720 Malignant neoplasm of spinal cord: Secondary | ICD-10-CM | POA: Diagnosis not present

## 2021-09-19 NOTE — Progress Notes (Signed)
[place]  [address] [place],  [place]  [address] [phone]  TELEHEALTH STATEMENT: I connected with Dakota Bennett on 09/25/21 at 1:31 PM EDT by telephone visit and verified that I am speaking with the correct person using two identifiers.   I discussed the limitations, risks, security and privacy concerns of performing an evaluation and management service by telemedicine and the availability of in-person appointments. I also discussed with the patient that there may be a patient responsible charge related to this service. The patient expressed understanding and agreed to proceed.   Other persons participating in the visit and their role in the encounter:  Wife Patient's location:  Home Provider's location:  Office  Clinic Day:  09/25/2021  Referring physician: Serita Grammes, MD   This document serves as a record of services personally performed by Hosie Poisson, MD. It was created on their behalf by [facility], a trained medical scribe. The creation of this record is based on the scribe's personal observations and the provider's statements to them.  CHIEF COMPLAINT:  CC: Stage IIIA rectosigmoid adenocarcinoma   Current Treatment:  Observation   HISTORY OF PRESENT ILLNESS:  Dakota Bennett is a 75 y.o. 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 This was found because of a positive Cologuard test, but he did have some rectal bleeding for several months.  Colonoscopy revealed an ulcerated mass at the proximal rectum at 9 cm and extending from the proximal rectum to the rectosigmoid junction.  An endoscopic ultrasound was done and confirmed a nonobstructing medium-sized mass of the proximal rectum involving 2/3 of the circumference and measuring 3 cm long, occurring 10 cm from the anal verge.  He also had 2 rounded suspicious perirectal lymph nodes measuring 5 and 6 mm.   CT scans of the abdomen and pelvis revealed several rounded nodules at the lung bases, which were tiny and largely unchanged from prior scans, so felt to likely be benign. CEA was elevated at 28.3.  He received neoadjuvant chemoradiation with 5 fluorouracil infusion for the 1st and 5th week of radiation and completed treatment the end of June.  He tolerated this fairly well, but had severe dysuria, as well as urinary hesitancy and soft stools.  CT chest revealed several pulmonary nodules, which had enlarged.  There was generalized wall thickening in the rectum, which could be due to the radiotherapy.  He underwent PET scan, which revealed increased uptake in the pulmonary nodules felt to be consistent with metastatic disease.  There was no evidence of persistence of disease in the rectum or other areas of metastasis.  His surgical plans were canceled.  The CEA was normal at that time. We obtained KRAS testing on his tumor, which did show a mutation.  MMR testing of the tumor was normal.  We recommended palliative chemotherapy with FOLFOX/bevacizumab.  The patient had persistent thrombocytopenia, so we reduced his chemotherapy doses by a total of 25%.  Repeat imaging after 6 cycles of FOLFOX/bevacizumab was overall stable, there was some increase in the larger pulmonary nodules, however, they appeared necrotic indicating a response to therapy.   After 12 cycles of FOLFOX/bevacizumab restaging scans were obtained that showed the dominant left lower lobe lesion had substantially decreased in size and no new or progressive pulmonary nodule or masses were seen.  Oxaliplatin was discontinued due to neuropathy in January 2019.  Repeat CT imaging after 20 cycles  revealed stable to decreased pulmonary nodules with no other evidence of metastasis.    He was continued on palliative chemotherapy with occasional changes in dosing and schedule.  He has had several episodes of rectal bleeding.  Dr. Lyndel Safe performed sigmoidoscopy  in March 2020 which did not reveal any evidence of disease in the rectum.  He had worsening neuropathy of the feet, so was started on gabapentin 300 mg at bedtime and the dose was increased but later stopped.  While having a 39th cycle of infusional 5-fluorouracil, he contacted [place] to report episodes of chest pain since starting his treatment with radiation down the arms and an episode of emesis.  He was referred to the emergency department and found to have had a non STEM in March 2020I.  His 5 FU chemotherapy pump was discontinued.  He was transferred to [facility] for further evaluation and treatment, and had 3 stents placed in early April. Per Dr. Joya Gaskins telehealth note, the patient had residual LAD disease and recommended he be treated medically unless he has refractory angina, as he would require atherectomy of a proximal LAD lesion.  Dr. Bettina Gavia recommended discontinuing the gabapentin.  He placed the patient back on magnesium oxide 400 mg daily.  He was also started on clopidogrel 75 mg daily, as well as isosorbide mononitrate 30 mg once a day, amlodipine 5 mg once a day, atorvastatin 80 mg once a day and aspirin 81 mg daily.  Chemotherapy was held due to his recent non STEMI and resumed in May 2020.  Bevacizumab was discontinued due to recurrent rectal bleeding in June 2020 and the dose of chemotherapy was reduced by 10% due to worsening thrombocytopenia.  CT imaging in July 2020 revealed a mild interval increase in size of the extensive bilateral pulmonary nodules, as well as increased wall thickening of the rectum.  He also was found to have increasing splenomegaly.  His hemoglobin had also dropped to 7.8, and his platelet count had dropped to 80,000.  We therefore stopped his treatment due to progressive disease, as well as worsening anemia and thrombocytopenia.  He had evidence of recurrent iron deficiency, so received IV iron in July with improvement in his anemia.  Since July of 2020 he has had slow  steady progression of his lung metastases but has remained asymptomatic.  His CEA has steadily increased as well.  CT chest, abdomen and pelvis from October 2021 revealed mild progression of the diffuse bilateral pulmonary metastases and a new 11 mm left mediastinal lymph node.  Index nodule in the medial left lower lobe currently measures 3.4 x 2.7 cm, compared to 2.8 x 2.2 cm previously.  Otherwise, the exam was stable.  CEA from October was 457.0.  Dakota Bennett presented to [facility] on November 22nd due to 5 episodes of bright red blood per rectum with dark clotting that day and was admitted.  His hemoglobin dropped down to 6 during his stay and he received 2 units of PRBCs on November 24th.  He was advised to hold Plavix.  He underwent a flexible sigmoidoscopy on November 25th revealing a single small ulcer with a prominent visble vessel in the distal rectum. For hemostasis, one hemostatic clip was successfully placed. There was no bleeding at the end of the procedure.  A fungating and ulcerated non-obstructing mass was found in the rectum. The mass was traversable, circumferential and measured five cm in length. There was mild contact oozing present. This was biopsied and surgical pathology was  consistent with adenocarcinoma.  The sigmoid colon mucosa proximal to the mass was otherwise normal appearing.  Non-bleeding small internal hemorrhoids were found during retroflexion.  His hemoglobin was up to 9.3 when I saw him in December.  At that time, we discussed options of palliation and referred him to Dr. Orlene Erm for some additional radiation.  He had COVID infection is December 2021.  Dakota Bennett completed a 3250 cGy course of radiation, a total of 13 fractions, on January 21st 2022.  CT imaging from February revealed numerous pulmonary masses and nodules, which are significantly increased in size, consistent with worsened pulmonary metastatic disease.  An index mass of the dependent left lower lobe measures  4.0 x 3.2 cm, previously 2.8 x 2.5 cm. Another index mass of the subpleural right upper lobe measures 3.6 x 2.9 cm, previously 2.4 x 2.4 cm.  There is also interval enlargement of a hypodense AP window lymph node now measuring 1.9 x 1.4 cm, previously 1.8 x 1.1 cm, consistent with worsened nodal metastatic disease.  No significant interval change in the circumferential wall thickening of the distal sigmoid colon and rectum with adjacent fat stranding in the low pelvis, consistent with post treatment appearance of primary colorectal malignancy.  CT imaging from June revealed mildly increased size to some pulmonary nodules; similar appearance of circumferential wall thickening of the distal sigmoid colon and rectum with surrounding soft tissue stranding; no sign of abdominopelvic adenopathy or solid organ metastasis within the abdomen.   In July 2022, MRI of the lumber spine revealed intradural tumor. The most prominent finding is a 2.8 x 1.5 x 1.9 cm intradural mass at the L1-L2 region, filling the thecal sac and displacing the nerves, more towards the left. There are some other tiny enhancing nodules within the other nerves of the cauda equina and there is some enhancement at the tip of the thecal sac.   CTA of the chest and CT abdomen and pelvis were stable, and revealed no further metastatic disease, but he does still have chronic thickening of the rectal wall.  He has received radiation therapy to the lumbar spine.    INTERVAL HISTORY:  Jhalen presents in the form of a virtual visit in order to review recent imaging results. CT chest, abdomen and pelvis from October 26th revealed mildly progressive pulmonary metastasis. Rectal primary, possibly slightly decreased. Cirrhosis and portal venous hypertension with chronic splenomegaly. New hypoattenuating splenic lesions are nonspecific. Possibly due to iron deposition in the setting of cirrhosis. Splenic metastasis are a secondary consideration. Progressive  osseous metastasis. MRI head from October 28th was positive for metastatic disease to the brain, although four out of five enhancing brain lesions are associated with choroid plexus (in the right lateral ventricle, the 4th ventricle, and the foramen of Luschka greater on the right). No edema associated with those lesions, and no generalized ependymal or leptomeningeal disease. Superimposed 17 mm medial right parietal lobe metastasis with mild to moderate vasogenic edema, but no significant regional mass effect. 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 Progression of disease since the July MRI, although the dominant L1-L2 cauda equina metastasis is smaller. No definite superimposed vertebral metastasis, with stable heterogeneous signal in the L3 body since July. Severely distended urinary bladder as on the recent CT: 1,080 mL. Dakota Bennett is no longer able ot walk and is bed-bound. He is incontinent of bowel and bladder. He does complain of pelvic pain which may be related to the metastatic  disease of his pelvic bones and spinal cord.   His wife has been using the MS Contin as needed, and so I educated her on the concept of long acting opioids, and reassured her that this is the lowest dose of 15 mg. She needs to administer this every 12 hours and use oxycodone for breakthrough pain. He has oxycodone 5 mg to use 1-2 and usually wears off in 1--2 hours and so he may use it to every 3. If this is insufficient, he may go up to 3 pill, I.e 15 mg. He is confused on and off. He has shortness of breath and some trouble swallowing now. I offered to order a catheter and he would like that. I also offered liquid morphine concentrate and explained it could be used sublingual if he is unable to swallow. She is somewhat fearful of the morphine and we will have Hospice continue to educate her on these medications. I reviewed the scans in detail and will place him  on dexamethasone 4 mg BID. I recommended he take the 2nd dose in the afternoon and not too late in the evening. This is to help his cerebral edema. He is eating very little, 25% or less according to his wife and has nausea, but no emesis. He is sleeping a lot of the time and complains of fatigue. They are finally willing to have Hospice come out and we will make that referral today to [facility].  REVIEW OF SYSTEMS:  Review of Systems  Constitutional:  Positive for appetite change (decreased) and fatigue. Negative for chills, fever and unexpected weight change.  HENT:   Positive for trouble swallowing.   Eyes: Negative.   Respiratory:  Positive for shortness of breath. Negative for chest tightness, cough, hemoptysis and wheezing.   Cardiovascular: Negative.  Negative for chest pain, leg swelling and palpitations.  Gastrointestinal:  Positive for nausea. Negative for abdominal distention, abdominal pain, blood in stool, constipation, diarrhea and vomiting.  Endocrine: Negative.   Genitourinary:  Positive for bladder incontinence. Negative for difficulty urinating, dysuria, frequency and hematuria.   Musculoskeletal:  Positive for gait problem (unable to walk). Negative for arthralgias, back pain, flank pain and myalgias.       Pelvic pain  Skin: Negative.   Neurological:  Positive for extremity weakness and gait problem (unable to walk). Negative for dizziness, headaches, light-headedness, numbness, seizures and speech difficulty.  Hematological: Negative.   Psychiatric/Behavioral:  Positive for confusion. Negative for depression and sleep disturbance. The patient is not nervous/anxious.   All other systems reviewed and are negative.   VITALS:  There were no vitals taken for this visit.  Wt Readings from Last 3 Encounters:  08/19/21 160 lb 14.4 oz (73 kg)  07/17/21 168 lb (76.2 kg)  05/08/21 181 lb 3.2 oz (82.2 kg)    There is no height or weight on file to calculate  BMI.  Performance status (ECOG): 3 - Symptomatic, >50% confined to bed  PHYSICAL EXAM:  Physical Exam deferred as this is a TELEHEALTH VISIT  LABS:   CBC Latest Ref Rng & Units 09/17/2021 08/19/2021 07/17/2021  WBC - 7.8 6.0 5.3  Hemoglobin 13.5 - 17.5 13.1(A) 11.9(A) 11.5(A)  Hematocrit 41 - 53 40(A) 36(A) 34(A)  Platelets 150 - 399 133(A) 107(A) 145(A)   CMP Latest Ref Rng & Units 09/17/2021 08/19/2021 07/17/2021  Glucose 70 - 99 mg/dL - - -  BUN 4 - 21 8 9 7   Creatinine 0.6 - 1.3 0.5(A) 0.6  0.6  Sodium 137 - 147 132(A) 132(A) 134(A)  Potassium 3.4 - 5.3 4.6 4.3 4.1  Chloride 99 - 108 96(A) 101 102  CO2 13 - 22 28(A) 23(A) 22  Calcium 8.7 - 10.7 9.0 8.8 8.7  Total Protein 6.5 - 8.1 g/dL - - -  Total Bilirubin 0.3 - 1.2 mg/dL - - -  Alkaline Phos 25 - 125 188(A) 127(A) 181(A)  AST 14 - 40 232(A) 44(A) 34  ALT 10 - 40 145(A) 27 28     Lab Results  Component Value Date   CEA1 442.0 (H) 04/29/2021   /  CEA  Date Value Ref Range Status  04/29/2021 442.0 (H) 0.0 - 4.7 ng/mL Final    Comment:    (NOTE)                             Nonsmokers          <3.9                             Smokers             <5.6 [employer] Electrochemiluminescence Immunoassay (ECLIA) Values obtained with different assay methods or kits cannot be used interchangeably.  Results cannot be interpreted as absolute evidence of the presence or absence of malignant disease. Performed At: [facility] [place], [place] [id number] Rush Farmer MD DX:[id number]      STUDIES:   EXAM: 09/17/2021 CT CHEST, ABDOMEN, AND PELVIS WITH CONTRAST  TECHNIQUE: Multidetector CT imaging of the chest, abdomen and pelvis was performed following the standard protocol during bolus administration of intravenous contrast.  CONTRAST: 100 cc of Isovue 370  COMPARISON: 06/16/2021  FINDINGS: CT CHEST FINDINGS Cardiovascular: Left Port-A-Cath tip low SVC. Aortic atherosclerosis.  Mild cardiomegaly. Three vessel coronary artery calcification. No central pulmonary embolism, on this non-dedicated study. Mediastinum/Nodes: No supraclavicular adenopathy. No hilar adenopathy. AP window node measures 1.3 x 1.8 cm on 23/2 versus 2.0 x 1.4 cm on the prior exam (when remeasured). Contrast level within the esophagus including on 15/2. Lungs/Pleura: No pleural fluid. Innumerable bilateral pulmonary nodules and masses. Index posterior right upper lobe pulmonary nodule measures 2.6 by 2.0 cm on 41/4 versus 2.2 x 1.9 cm on the prior exam (when remeasured). Dominant superior segment left lower lobe lung mass measures 5.3 by 4.7 cm on 50/4 versus 4.8 x 4.3 cm on the prior exam (when remeasured). Index lingular nodule measures 2.0 cm on 52/4 versus 1.9 cm on the prior exam (when remeasured). Musculoskeletal: Lower left rib foci of sclerosis anteriorly are likely posttraumatic.  CT ABDOMEN PELVIS FINDINGS Hepatobiliary: Subtle irregular hepatic capsule is suspected with mild caudate lobe enlargement. Normal gallbladder, without biliary ductal dilatation. Pancreas: Normal, without mass or ductal dilatation. Spleen: Splenomegaly at 17.6 cm craniocaudal versus 19.0 cm on the prior. Multiple hypoattenuating splenic lesions are not readily apparent on the prior exam and were absent on 04/30/2021. Adrenals/Urinary Tract: Bilateral adrenal thickening. Moderate left renal cortical thinning. No hydronephrosis. Marked bladder distension. Stomach/Bowel: Normal stomach, without wall thickening. Descending duodenal diverticulum. Otherwise normal small bowel. Colonic stool burden suggests constipation. Rectal wall thickening including on 114/2 is moderate, possibly slightly decreased. Normal appendix. Vascular/Lymphatic: Advanced aortic and branch vessel atherosclerosis. Portal venous hypertension, as evidenced by recanalized paraumbilical vein. No abdominopelvic  adenopathy. Reproductive: Normal prostate. Other: No significant free fluid. Small bilateral fat containing  inguinal hernias. The right inguinal hernia no longer contains bladder. No evidence of omental or peritoneal disease. Musculoskeletal: Developing lytic and sclerotic lesion involving the right iliac wing including at 3.3 cm on 103/2. Smaller, subtle left iliac wing lesion rincluding on 104/2.  IMPRESSION: 1. Mildly progressive pulmonary metastasis. 2. Rectal primary, possibly slightly decreased. 3. Cirrhosis and portal venous hypertension with chronic splenomegaly. New hypoattenuating splenic lesions are nonspecific. Possibly due to iron deposition in the setting of cirrhosis. Splenic metastasis are a secondary consideration. 4. Progressive osseous metastasis. 5. Marked bladder distension. 6. Possible constipation. 7. Esophageal air fluid level suggests dysmotility or gastroesophageal reflux. 8. Coronary artery atherosclerosis. Aortic Atherosclerosis (ICD10-I70.0).  EXAM: 09/19/2021 MRI HEAD WITHOUT AND WITH CONTRAST  TECHNIQUE: Multiplanar, multiecho pulse sequences of the brain and surrounding structures were obtained without and with intravenous contrast.  CONTRAST: 7 mL Gadavist  COMPARISON: PET-CT 07/02/2017.  FINDINGS: Brain: Multiple round enhancing brain lesions, with conspicuity of enhancement most pronounced on the more delayed sagittal and coronal postcontrast images. Two of the lesions appear to be choroid plexus related, enhancing and inseparable from the choroid plexus of the right lateral ventricle (series 22, image 9 and series 21, image 115 to 16 mm long axis) and the right foramen of Luschka in the posterior fossa (series 22, image 13 and series 23, image 9 up to 16 mm long axis). No significant parenchymal edema with these lesions. But a third oval 17 mm intra-axial lesion of the medial right parietal lobe (series 21, image 131) is associated  with moderate regional vasogenic edema (series 8, image 30). But only mild regional mass effect. Furthermore, there are smaller more subtle but pathologic nodular enhancing foci also associated with the 4th ventricle (7-8 mm on series 21 image 48) and left foramen of Luschka (series 21, image 46 and series 22, image 13, roughly 10 mm). But no generalized ependymal or leptomeningeal enhancement or nodularity. No dural thickening. No other intra-axial metastasis is identified. No superimposed restricted diffusion to suggest acute infarction. No midline shift, ventriculomegaly, extra-axial collection or acute intracranial hemorrhage. Cervicomedullary junction and pituitary are within normal limits. Scattered and patchy mild to moderate for age nonspecific cerebral white matter T2 and FLAIR hyperintensity in both hemispheres. No cortical encephalomalacia or chronic cerebral blood products identified. Vascular: Major intracranial vascular flow voids are preserved. The right vertebral artery appears dominant, the left V4 segment is diminutive. The major dural venous sinuses appear to be enhancing and patent. Skull and upper cervical spine: Negative visible cervical spine. Visualized bone marrow signal is within normal limits. Sinuses/Orbits: Negative orbits. Mild mucosal thickening and/or fluid in the left sphenoid sinus but the other paranasal sinuses are well aerated. Other: Mastoids are clear. Grossly normal visible internal auditory structures.  IMPRESSION: 1. Positive for metastatic disease to the brain, although four out of five enhancing brain lesions are associated with choroid plexus (in the right lateral ventricle, the 4th ventricle, and the foramen of Luschka greater on the right). No edema associated with those lesions, and no generalized ependymal or leptomeningeal disease. Superimposed 17 mm medial right parietal lobe metastasis with mild to moderate vasogenic edema, but  no significant regional mass effect. 2. No other metastatic disease or acute intracranial abnormality identified.  EXAM: 09/19/2021 MRI LUMBAR SPINE WITHOUT AND WITH CONTRAST  TECHNIQUE: Multiplanar and multiecho pulse sequences of the lumbar spine were obtained without and with intravenous contrast.  CONTRAST: 7 mL Gadavist in conjunction with contrast enhanced imaging of the brain reported separately.  COMPARISON: Restaging CT Chest, Abdomen, and Pelvis 09/17/2021. Lumbar MRI 06/16/2021.  FINDINGS: Segmentation: Normal on recent CT, the same numbering system used in July. Alignment: Stable vertebral height and alignment with mild straightening of lordosis. Vertebrae: Oval heterogeneous enhancement in the right L3 vertebral body measuring 8 mm is probably a benign hemangioma rather than metastasis, not significantly changed from July and with adjacent intrinsic T1 hyperintensity. See series 19, image 6 today. Nearby subtle decreased T1 signal and enhancement within the superior left L3 body marrow is stable but more indeterminate. But elsewhere no destructive or suspicious marrow lesion is identified in the visible lower thoracic or lumbosacral spine. Conus medullaris and cauda equina: Conus extends to the T12 level, and only faintly visible. No conus edema is apparent. There is severe nodularity and enhancement throughout the cauda equina and lower thecal sac, including progressed bulky nodularity at the conus (series 12, image 8 and series 19, image 8). However, a dominant enhancing intrathecal mass at the L1-L2 level has regressed since July, now 17-18 mm long axis (28 mm in July). But there is now pronounced caking of abnormal cauda equina enhancement from L3 through the sacrum (series 19 images 8 and 10) in keeping with progressed leptomeningeal carcinomatosis. Paraspinal and other soft tissues: Stable from the recent restaging CT Chest, Abdomen, and Pelvis, including  severely distended urinary bladder (estimated bladder volume 1080 mL). Disc levels: No age advanced lumbar spine degeneration or malignant spinal stenosis.  IMPRESSION: 1. 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 Progression of disease since the July MRI, although the dominant L1-L2 cauda equina metastasis is smaller. 2. No definite superimposed vertebral metastasis, with stable heterogeneous signal in the L3 body since July. 3. Severely distended urinary bladder as on the recent CT: 1,080 mL.  Allergies:  Allergies  Allergen Reactions   Lisinopril Hives        Adhesive [Tape] Rash    Bandaids   Aspirin Other (See Comments)    Burns stomach    Neosporin [Neomycin-Bacitracin Zn-Polymyx] Rash    Current Medications: Current Outpatient Medications  Medication Sig Dispense Refill   albuterol (VENTOLIN HFA) 108 (90 Base) MCG/ACT inhaler SMARTSIG:2 Puff(s) Via Inhaler     dexamethasone (DECADRON) 4 MG tablet Take 1 tablet (4 mg total) by mouth 2 (two) times daily. 60 tablet 3   diphenoxylate-atropine (LOMOTIL) 2.5-0.025 MG tablet Take 1-2 tablets by mouth 4 (four) times daily as needed for diarrhea or loose stools.     famotidine (PEPCID) 20 MG tablet Take 20 mg by mouth daily.     gabapentin (NEURONTIN) 300 MG capsule TAKE 1 CAPSULE BY MOUTH TWICE A DAY 60 capsule 5   metoprolol succinate (TOPROL-XL) 50 MG 24 hr tablet Take 50 mg by mouth daily.     minoxidil (LONITEN) 2.5 MG tablet Take 5 mg by mouth 2 (two) times daily.     morphine (MS CONTIN) 15 MG 12 hr tablet Take 1 tablet (15 mg total) by mouth every 12 (twelve) hours. 60 tablet 0   mupirocin ointment (BACTROBAN) 2 % SMARTSIG:1 Application Topical 2-3 Times Daily     nitroGLYCERIN (NITROSTAT) 0.4 MG SL tablet Place 0.4 mg under the tongue every 5 (five) minutes as needed for chest pain.     oxyCODONE (OXY IR/ROXICODONE) 5 MG immediate release tablet Take 1-2 tablets (5-10 mg  total) by mouth every 4 (four) hours as needed for severe pain. 120 tablet 0   Potassium Chloride ER 20 MEQ TBCR  Take 1 tablet by mouth daily.     pravastatin (PRAVACHOL) 20 MG tablet Take 1 tablet (20 mg total) by mouth every evening. 90 tablet 3   prochlorperazine (COMPAZINE) 10 MG tablet Take 10 mg by mouth every 6 (six) hours as needed.     promethazine-dextromethorphan (PROMETHAZINE-DM) 6.25-15 MG/5ML syrup Take 5 mLs by mouth at bedtime as needed.     rOPINIRole (REQUIP) 0.5 MG tablet Take 0.5 mg by mouth at bedtime.     No current facility-administered medications for this visit.     ASSESSMENT & PLAN:   Assessment:   1. Metastatic colorectal cancer to the lung.  He had mild progression of disease on palliative infusional 5 fluorouracil/leucovorin/bevacizumab every 3 weeks, so was switched back to infusional 5 fluorouracil/leucovorin/bevacizumab every 2 weeks.  Treatment was discontinued in 2020 over 2 years ago.  He has had continued slow steady progression of his disease until now. He now has more rapidly progressive involvement of the central nervous system. CEA is now up to 904.    2.  Rectal cancer which has not been resected due to the metastatic disease.  In November 2021 sigmoidoscopy confirmed a 5 cm fungating and ulcerated non-obstructing mass in the rectum and biopsy was consistent with adenocarcinoma.  Therefore, he has received palliative radiation x2. 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  3.  Brain metastases. He has confusion and inability to ambulate. We will try him on a course of steroids to help the mild vasogenic cerebral edema.   4.  Intradural metastases.  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 This is associated with incontinence of bowel and bladder. He has requested a urinary catheter. He is no longer able to ambulate due to  lower extremity weakness. Physical therapy is no longer affective due to the spinal involvement.   5. Thrombocytopenia, resolved.  6. Splenomegaly and liver cirrhosis on CT imaging, which is likely contributing to his thrombocytopenia.    Plan: I discussed with the patient and his wife that he is showing rapid decline now and recommended symptom management. We will place him on corticosteroids. I have spent time educating his wife about the pain medications as she is not using them properly now and is afraid of the morphine. Eventually I would like to get Roxanol concentrate in the home and his swallowing is difficult. They are agreeable to Hospice referral and we will ask them to see him in the next 24 hours. He may need oxygen prn for his dyspnea. We discussed Hospice services. I can repeat a TeleHealth appointment in a few weeks if they wish. He does have a DNR in place. He and his wife verbalizes understanding of and agreement to the plans discussed today. They knows to call the office should any new questions or concerns arise.   I provided 27 minutes of time during this this encounter and > 50% was spent counseling as documented under my assessment and plan.    Derwood Kaplan, MD [facility] AT [facility] [address] [place] [address] Dept: [phone] Dept Fax: [phone]   I, Rita Ohara, am acting as scribe for Derwood Kaplan, MD  I have reviewed this report as typed by the medical scribe, and it is complete and accurate.

## 2021-09-19 NOTE — Telephone Encounter (Addendum)
Correction-- nurse was from [facility] home health.  ----- Message from Derwood Kaplan, MD sent at 09/18/2021  6:12 PM EDT ----- Regarding: RE: chest pain  He is not on hospice, even though we have discussed mult times.  That must be the home health RN ----- Message ----- From: Marvia Pickles, PA-C Sent: 09/18/2021   3:21 PM EDT To: Derwood Kaplan, MD Subject: FW: chest pain - Hospice pt                    FYI, but why would he go to ED if on hospice? ----- Message ----- From: [name], RN Sent: 09/18/2021   2:16 PM EDT To: Marvia Pickles, PA-C Subject: chest pain - Hospice pt                        Received call from Hospice nurse.  She wanted to give [place] an update. Pt had chest pain last night, relieved with NTG. Today the bath aide called to report he has had another episode. Elmyra Ricks, RN, spoke with pt's wife and told her she can repeat the NTG up to 3 times. If chest pain persists they need to call 911.

## 2021-09-22 DIAGNOSIS — C7802 Secondary malignant neoplasm of left lung: Secondary | ICD-10-CM | POA: Diagnosis not present

## 2021-09-22 DIAGNOSIS — E119 Type 2 diabetes mellitus without complications: Secondary | ICD-10-CM | POA: Diagnosis not present

## 2021-09-22 DIAGNOSIS — C7949 Secondary malignant neoplasm of other parts of nervous system: Secondary | ICD-10-CM | POA: Diagnosis not present

## 2021-09-22 DIAGNOSIS — C7801 Secondary malignant neoplasm of right lung: Secondary | ICD-10-CM | POA: Diagnosis not present

## 2021-09-22 DIAGNOSIS — C19 Malignant neoplasm of rectosigmoid junction: Secondary | ICD-10-CM | POA: Diagnosis not present

## 2021-09-22 DIAGNOSIS — K7460 Unspecified cirrhosis of liver: Secondary | ICD-10-CM | POA: Diagnosis not present

## 2021-09-24 ENCOUNTER — Telehealth: Payer: Self-pay

## 2021-09-24 DIAGNOSIS — K7460 Unspecified cirrhosis of liver: Secondary | ICD-10-CM | POA: Diagnosis not present

## 2021-09-24 DIAGNOSIS — E119 Type 2 diabetes mellitus without complications: Secondary | ICD-10-CM | POA: Diagnosis not present

## 2021-09-24 DIAGNOSIS — C7802 Secondary malignant neoplasm of left lung: Secondary | ICD-10-CM | POA: Diagnosis not present

## 2021-09-24 DIAGNOSIS — C19 Malignant neoplasm of rectosigmoid junction: Secondary | ICD-10-CM | POA: Diagnosis not present

## 2021-09-24 DIAGNOSIS — C7949 Secondary malignant neoplasm of other parts of nervous system: Secondary | ICD-10-CM | POA: Diagnosis not present

## 2021-09-24 DIAGNOSIS — C7801 Secondary malignant neoplasm of right lung: Secondary | ICD-10-CM | POA: Diagnosis not present

## 2021-09-24 NOTE — Telephone Encounter (Addendum)
   RE: Home health nursing planning to d/c from services Received: Today Derwood Kaplan, MD  [name], RN It is bad news on his scans.  He has telehealth appt tomorrow.      Previous Messages   ----- Message -----  From: [name], RN  Sent: 09/24/2021  10:32 AM EDT  To: Derwood Kaplan, MD  Subject: Home health nursing planning to d/c from ser*   UPDATE:  Nicole,RN, called back @ 1018. She was just leaving visit with pt. The patient and patients wife agreed to let Hospice come out and discuss services. They will make referral.    @ [phone] - I called Nicole,RN, back and told her that I have spoken to pt and pt's wife this morning. Pt's wife is still not ready for Hospice to come out and discuss their services. I told Elmyra Ricks, that pt has telephone visit with you tomorrow.   @0850  - I called and spoke with pt and his wife. Pt is pretty lucid today. He was able to correctly tell me the year, & president. "I'm feeling alright. I do have pain in my legs and rectum". He denies diarrhea. No SOB. I then spoke to pt's wife. She said, "He is awake and coherent this morning. Yesterday was a bad day". They do have pain medication in the home and she is giving it. I mentioned Hospice coming out to discuss their services with them. She states, "Not ready for Hospice services yet".   @ [phone]. Nicole,RN, with [facility] home health, LVM on nurse line stating , "I'm going out to see Mr Cullens today. I am going to complete a discipline discharge out of home health nursing services, as pt is requiring more care than home health can provide. He is not making any progress. I'm just wondering if Dr Hinton Rao would agree to [facility] referral if they will accept". He is not awake very much per the home health aide.

## 2021-09-25 ENCOUNTER — Telehealth: Payer: Self-pay

## 2021-09-25 ENCOUNTER — Encounter: Payer: Self-pay | Admitting: Oncology

## 2021-09-25 ENCOUNTER — Inpatient Hospital Stay: Payer: Medicare Other | Attending: Oncology | Admitting: Oncology

## 2021-09-25 ENCOUNTER — Other Ambulatory Visit: Payer: Self-pay | Admitting: Oncology

## 2021-09-25 ENCOUNTER — Telehealth: Payer: Self-pay | Admitting: Oncology

## 2021-09-25 DIAGNOSIS — C7801 Secondary malignant neoplasm of right lung: Secondary | ICD-10-CM | POA: Diagnosis not present

## 2021-09-25 DIAGNOSIS — C7931 Secondary malignant neoplasm of brain: Secondary | ICD-10-CM | POA: Insufficient documentation

## 2021-09-25 DIAGNOSIS — C20 Malignant neoplasm of rectum: Secondary | ICD-10-CM

## 2021-09-25 DIAGNOSIS — C701 Malignant neoplasm of spinal meninges: Secondary | ICD-10-CM | POA: Diagnosis not present

## 2021-09-25 DIAGNOSIS — C7802 Secondary malignant neoplasm of left lung: Secondary | ICD-10-CM

## 2021-09-25 LAB — CEA: CEA: 904

## 2021-09-25 MED ORDER — DEXAMETHASONE 4 MG PO TABS: 4.0000 mg | ORAL_TABLET | Freq: Two times a day (BID) | ORAL | 3 refills | Status: AC

## 2021-09-25 NOTE — Telephone Encounter (Signed)
Per Dr. Hinton Rao:   Hinton Rao, Chinita Pester, MD  Georgette Shell, RN; [name], RN I just had telehealth appt for 30 minutes.  They are ready for hospice, would like ASAP.  He has rectal cancer met to lung but now through the lower spine and brain.  He is bedbound, incontinent of bowel and bladder, needs catheter  I tried to educate about pain regimen, she was giving MS Contin prn when oxycodone not enough   I explained to take MS q 12 hours and use oxycodone 5 mg 1-2 q 3 hours.  If not sufficient, we can increase to 3 pills (15 mg).  He has trouble swallowing and I rec. Liquid Roxanol but wife afraid of the Morphine, needs education. Dyspnea so needs prn O2.  I am starting on Decadron 53m bid   I can do another telehealth appt in a few weeks if they wish, expect rapid decline from here.  [place] I explained their services, we will stop the home health now

## 2021-09-25 NOTE — Telephone Encounter (Signed)
No LOS Entered 

## 2021-09-26 DIAGNOSIS — K922 Gastrointestinal hemorrhage, unspecified: Secondary | ICD-10-CM | POA: Diagnosis not present

## 2021-09-26 DIAGNOSIS — C7931 Secondary malignant neoplasm of brain: Secondary | ICD-10-CM | POA: Diagnosis not present

## 2021-09-26 DIAGNOSIS — G620 Drug-induced polyneuropathy: Secondary | ICD-10-CM | POA: Diagnosis not present

## 2021-09-26 DIAGNOSIS — G2581 Restless legs syndrome: Secondary | ICD-10-CM | POA: Diagnosis not present

## 2021-09-26 DIAGNOSIS — J309 Allergic rhinitis, unspecified: Secondary | ICD-10-CM | POA: Diagnosis not present

## 2021-09-26 DIAGNOSIS — C20 Malignant neoplasm of rectum: Secondary | ICD-10-CM | POA: Diagnosis not present

## 2021-09-26 DIAGNOSIS — E440 Moderate protein-calorie malnutrition: Secondary | ICD-10-CM | POA: Diagnosis not present

## 2021-09-26 DIAGNOSIS — I2510 Atherosclerotic heart disease of native coronary artery without angina pectoris: Secondary | ICD-10-CM | POA: Diagnosis not present

## 2021-09-26 DIAGNOSIS — K219 Gastro-esophageal reflux disease without esophagitis: Secondary | ICD-10-CM | POA: Diagnosis not present

## 2021-09-26 DIAGNOSIS — I10 Essential (primary) hypertension: Secondary | ICD-10-CM | POA: Diagnosis not present

## 2021-09-26 DIAGNOSIS — R634 Abnormal weight loss: Secondary | ICD-10-CM | POA: Diagnosis not present

## 2021-09-26 DIAGNOSIS — C7949 Secondary malignant neoplasm of other parts of nervous system: Secondary | ICD-10-CM | POA: Diagnosis not present

## 2021-09-26 DIAGNOSIS — R0902 Hypoxemia: Secondary | ICD-10-CM | POA: Diagnosis not present

## 2021-09-26 DIAGNOSIS — C7802 Secondary malignant neoplasm of left lung: Secondary | ICD-10-CM | POA: Diagnosis not present

## 2021-09-26 DIAGNOSIS — D696 Thrombocytopenia, unspecified: Secondary | ICD-10-CM | POA: Diagnosis not present

## 2021-09-26 DIAGNOSIS — T451X5S Adverse effect of antineoplastic and immunosuppressive drugs, sequela: Secondary | ICD-10-CM | POA: Diagnosis not present

## 2021-09-26 DIAGNOSIS — E785 Hyperlipidemia, unspecified: Secondary | ICD-10-CM | POA: Diagnosis not present

## 2021-09-26 DIAGNOSIS — C7801 Secondary malignant neoplasm of right lung: Secondary | ICD-10-CM | POA: Diagnosis not present

## 2021-09-26 DIAGNOSIS — C7800 Secondary malignant neoplasm of unspecified lung: Secondary | ICD-10-CM | POA: Diagnosis not present

## 2021-09-29 ENCOUNTER — Telehealth: Payer: Self-pay

## 2021-09-29 DIAGNOSIS — R634 Abnormal weight loss: Secondary | ICD-10-CM | POA: Diagnosis not present

## 2021-09-29 DIAGNOSIS — C7931 Secondary malignant neoplasm of brain: Secondary | ICD-10-CM | POA: Diagnosis not present

## 2021-09-29 DIAGNOSIS — C7801 Secondary malignant neoplasm of right lung: Secondary | ICD-10-CM | POA: Diagnosis not present

## 2021-09-29 DIAGNOSIS — C7802 Secondary malignant neoplasm of left lung: Secondary | ICD-10-CM | POA: Diagnosis not present

## 2021-09-29 DIAGNOSIS — G620 Drug-induced polyneuropathy: Secondary | ICD-10-CM | POA: Diagnosis not present

## 2021-09-29 DIAGNOSIS — C20 Malignant neoplasm of rectum: Secondary | ICD-10-CM | POA: Diagnosis not present

## 2021-09-29 NOTE — Telephone Encounter (Addendum)
09/29/21 -I spoke with Hospice nurse, and notified her that pt's wife is requesting the foley now. She is going out to visit pt today and will take care of that. I also called & spoke to pt's wife to see how Dakota Bennett is doing. She states, "He sleeps a lot. He hasn't really eaten in the last 2-3 days. She is able to arouse him pretty easily. She knew the Hospice nurse was coming out today.   ----- Message from Derwood Kaplan, MD sent at 09/25/2021  2:01 PM EDT ----- Regarding: hospice I just had telehealth appt for 30 minutes.  They are ready for hospice, would like ASAP. He has rectal cancer met to lung but now through the lower spine and brain. He is bedbound, incontinent of bowel and bladder, needs catheter I tried to educate about pain regimen, she was giving MS Contin prn when oxycodone not enough  I explained to take MS q 12 hours and use oxycodone 5 mg 1-2 q 3 hours.  If not sufficient, we can increase to 3 pills (15 mg).  He has trouble swallowing and I rec. Liquid Roxanol but wife afraid of the Morphine, needs education. Dyspnea so needs prn O2. I am starting on Decadron 36m bid  I can do another telehealth appt in a few weeks if they wish, expect rapid decline from here.

## 2021-09-30 ENCOUNTER — Telehealth: Payer: Self-pay

## 2021-09-30 DIAGNOSIS — R634 Abnormal weight loss: Secondary | ICD-10-CM | POA: Diagnosis not present

## 2021-09-30 DIAGNOSIS — C20 Malignant neoplasm of rectum: Secondary | ICD-10-CM | POA: Diagnosis not present

## 2021-09-30 DIAGNOSIS — C7801 Secondary malignant neoplasm of right lung: Secondary | ICD-10-CM | POA: Diagnosis not present

## 2021-09-30 DIAGNOSIS — C7931 Secondary malignant neoplasm of brain: Secondary | ICD-10-CM | POA: Diagnosis not present

## 2021-09-30 DIAGNOSIS — C7802 Secondary malignant neoplasm of left lung: Secondary | ICD-10-CM | POA: Diagnosis not present

## 2021-09-30 DIAGNOSIS — G620 Drug-induced polyneuropathy: Secondary | ICD-10-CM | POA: Diagnosis not present

## 2021-09-30 NOTE — Telephone Encounter (Addendum)
@   1220- I spoke with Kim,RN, and gave her the clarification of the gabapentin. She RBV order.  RE: HOSPICE MEDICATIONS Received: Today Derwood Kaplan, MD  [name], RN Go up to 4 pills daily but could do 2 of them at hs, or whatever is easiest         @1142  - I spoke with Kim,RN, Hospice nurse, and gave her Dr Remi Deter orders below. She RBV the orders. She did ask for clarification on order #2 - is gabapentin to be TID or QID?    RE: HOSPICE MEDICATIONS Received: Today Derwood Kaplan, MD  [name], RN So:  1. I didn't prescribe the ropinirole, go with 0.5 mg hs  2. Increase the gabapentin to 300 mg qid, would rec 300 mg bid and 600 mg hs  3. He can stop the potassium   4. Yes, stop the Minoxidil.          Received call from [place], Hospice nurse. She needs some clarification on some medications.  1. She need to clarify the Ropinirole dosing. Pt's bottle reads Ropinirole 1mg  po q HS. Is that ok? He was on ropinirole 0.5mg  q HS.  2. She asked to increase his gabapentin, to help with feet pain? Presently he is taking gabapentin 300mg  po TID.  3. Do you want to continue potassium?  4. Hospice doesn't cover Minoxidil for blood pressure. They do cover the metoprolol 50mg  po qd. Would you want to d/c the minoxidil?

## 2021-10-01 ENCOUNTER — Other Ambulatory Visit: Payer: Self-pay

## 2021-10-01 ENCOUNTER — Telehealth: Payer: Self-pay

## 2021-10-01 ENCOUNTER — Other Ambulatory Visit: Payer: Self-pay | Admitting: Hematology and Oncology

## 2021-10-01 DIAGNOSIS — C7931 Secondary malignant neoplasm of brain: Secondary | ICD-10-CM | POA: Diagnosis not present

## 2021-10-01 DIAGNOSIS — C7801 Secondary malignant neoplasm of right lung: Secondary | ICD-10-CM | POA: Diagnosis not present

## 2021-10-01 DIAGNOSIS — B372 Candidiasis of skin and nail: Secondary | ICD-10-CM

## 2021-10-01 DIAGNOSIS — R634 Abnormal weight loss: Secondary | ICD-10-CM | POA: Diagnosis not present

## 2021-10-01 DIAGNOSIS — B3749 Other urogenital candidiasis: Secondary | ICD-10-CM

## 2021-10-01 DIAGNOSIS — G620 Drug-induced polyneuropathy: Secondary | ICD-10-CM | POA: Diagnosis not present

## 2021-10-01 DIAGNOSIS — C7802 Secondary malignant neoplasm of left lung: Secondary | ICD-10-CM | POA: Diagnosis not present

## 2021-10-01 DIAGNOSIS — C20 Malignant neoplasm of rectum: Secondary | ICD-10-CM | POA: Diagnosis not present

## 2021-10-01 MED ORDER — NYSTATIN 100000 UNIT/GM EX POWD: 1.0000 "application " | Freq: Three times a day (TID) | CUTANEOUS | 0 refills | Status: AC

## 2021-10-01 MED ORDER — NYSTATIN-TRIAMCINOLONE 100000-0.1 UNIT/GM-% EX OINT: 1.0000 "application " | TOPICAL_OINTMENT | Freq: Two times a day (BID) | CUTANEOUS | 0 refills | Status: AC

## 2021-10-01 MED ORDER — NYSTATIN-TRIAMCINOLONE 100000-0.1 UNIT/GM-% EX OINT: 1.0000 "application " | TOPICAL_OINTMENT | Freq: Two times a day (BID) | CUTANEOUS | 1 refills | Status: AC

## 2021-10-01 MED ORDER — NYSTATIN 100000 UNIT/GM EX POWD: 1.0000 "application " | Freq: Three times a day (TID) | CUTANEOUS | 1 refills | Status: AC

## 2021-10-01 NOTE — Telephone Encounter (Addendum)
Dakota Parsons,NP, eRx meds.  ----- Message from Derwood Kaplan, MD sent at 10/01/2021  9:46 AM EST ----- Regarding: RE: Hospice orders Okay, can't she (or you) call it in.  (Won't be in their record anyway if I send in [place]) ----- Message ----- From: [name], RN Sent: 10/01/2021   9:10 AM EST To: Derwood Kaplan, MD Subject: Hospice orders                                 Received call from [name], Hospice nurse. She would like prescriptions/order for: 1) Nystatin powder to groin (heavy yeast) 2)Nystatin cream to uncircumcised penis area.

## 2021-10-02 ENCOUNTER — Telehealth: Payer: Self-pay

## 2021-10-02 ENCOUNTER — Other Ambulatory Visit: Payer: Self-pay | Admitting: Oncology

## 2021-10-02 DIAGNOSIS — G620 Drug-induced polyneuropathy: Secondary | ICD-10-CM | POA: Diagnosis not present

## 2021-10-02 DIAGNOSIS — C7801 Secondary malignant neoplasm of right lung: Secondary | ICD-10-CM | POA: Diagnosis not present

## 2021-10-02 DIAGNOSIS — R634 Abnormal weight loss: Secondary | ICD-10-CM | POA: Diagnosis not present

## 2021-10-02 DIAGNOSIS — C20 Malignant neoplasm of rectum: Secondary | ICD-10-CM | POA: Diagnosis not present

## 2021-10-02 DIAGNOSIS — C7802 Secondary malignant neoplasm of left lung: Secondary | ICD-10-CM | POA: Diagnosis not present

## 2021-10-02 DIAGNOSIS — C7931 Secondary malignant neoplasm of brain: Secondary | ICD-10-CM

## 2021-10-02 MED ORDER — LORAZEPAM 0.5 MG PO TABS: 0.5000 mg | ORAL_TABLET | ORAL | 0 refills | Status: AC | PRN

## 2021-10-02 NOTE — Telephone Encounter (Addendum)
@   [place] -I notified Melissa, RN, Hospice nurse that Dr Hinton Rao sent in the requested lorazepam 0.5mg  po q 4 hr PRN. She RBV the order correctly.    @ 1318 -Received call from Alveta Heimlich, Hospice nurse [facility]. She reports pt is getting more anxious, agitated, and confused . They don't have anything in the home to give him. She is asking for lorazepam 0.5mg  q 4hr PRN anxiety/agitation. Please send eRx to pharmacy on chart.

## 2021-10-03 ENCOUNTER — Other Ambulatory Visit: Payer: Self-pay

## 2021-10-03 DIAGNOSIS — C7931 Secondary malignant neoplasm of brain: Secondary | ICD-10-CM | POA: Diagnosis not present

## 2021-10-03 DIAGNOSIS — K5900 Constipation, unspecified: Secondary | ICD-10-CM

## 2021-10-03 DIAGNOSIS — C7801 Secondary malignant neoplasm of right lung: Secondary | ICD-10-CM | POA: Diagnosis not present

## 2021-10-03 DIAGNOSIS — C7802 Secondary malignant neoplasm of left lung: Secondary | ICD-10-CM | POA: Diagnosis not present

## 2021-10-03 DIAGNOSIS — Z515 Encounter for palliative care: Secondary | ICD-10-CM

## 2021-10-03 DIAGNOSIS — G620 Drug-induced polyneuropathy: Secondary | ICD-10-CM | POA: Diagnosis not present

## 2021-10-03 DIAGNOSIS — C20 Malignant neoplasm of rectum: Secondary | ICD-10-CM | POA: Diagnosis not present

## 2021-10-03 DIAGNOSIS — R634 Abnormal weight loss: Secondary | ICD-10-CM | POA: Diagnosis not present

## 2021-10-03 DIAGNOSIS — R0602 Shortness of breath: Secondary | ICD-10-CM

## 2021-10-03 MED ORDER — SENNOSIDES-DOCUSATE SODIUM 8.6-50 MG PO TABS: 2.0000 | ORAL_TABLET | Freq: Two times a day (BID) | ORAL | 0 refills | Status: AC

## 2021-10-03 MED ORDER — ALBUTEROL SULFATE (2.5 MG/3ML) 0.083% IN NEBU: 2.5000 mg | INHALATION_SOLUTION | RESPIRATORY_TRACT | 0 refills | Status: AC | PRN

## 2021-10-04 DIAGNOSIS — C20 Malignant neoplasm of rectum: Secondary | ICD-10-CM | POA: Diagnosis not present

## 2021-10-04 DIAGNOSIS — G620 Drug-induced polyneuropathy: Secondary | ICD-10-CM | POA: Diagnosis not present

## 2021-10-04 DIAGNOSIS — R634 Abnormal weight loss: Secondary | ICD-10-CM | POA: Diagnosis not present

## 2021-10-04 DIAGNOSIS — C7802 Secondary malignant neoplasm of left lung: Secondary | ICD-10-CM | POA: Diagnosis not present

## 2021-10-04 DIAGNOSIS — C7931 Secondary malignant neoplasm of brain: Secondary | ICD-10-CM | POA: Diagnosis not present

## 2021-10-04 DIAGNOSIS — C7801 Secondary malignant neoplasm of right lung: Secondary | ICD-10-CM | POA: Diagnosis not present

## 2021-10-06 DIAGNOSIS — R634 Abnormal weight loss: Secondary | ICD-10-CM | POA: Diagnosis not present

## 2021-10-06 DIAGNOSIS — C20 Malignant neoplasm of rectum: Secondary | ICD-10-CM | POA: Diagnosis not present

## 2021-10-06 DIAGNOSIS — C7949 Secondary malignant neoplasm of other parts of nervous system: Secondary | ICD-10-CM | POA: Diagnosis not present

## 2021-10-06 DIAGNOSIS — C7800 Secondary malignant neoplasm of unspecified lung: Secondary | ICD-10-CM | POA: Diagnosis not present

## 2021-10-06 DIAGNOSIS — C7931 Secondary malignant neoplasm of brain: Secondary | ICD-10-CM | POA: Diagnosis not present

## 2021-10-06 DIAGNOSIS — G620 Drug-induced polyneuropathy: Secondary | ICD-10-CM | POA: Diagnosis not present

## 2021-10-06 DIAGNOSIS — C7802 Secondary malignant neoplasm of left lung: Secondary | ICD-10-CM | POA: Diagnosis not present

## 2021-10-06 DIAGNOSIS — C7801 Secondary malignant neoplasm of right lung: Secondary | ICD-10-CM | POA: Diagnosis not present

## 2021-10-07 ENCOUNTER — Telehealth: Payer: Self-pay

## 2021-10-07 DIAGNOSIS — G620 Drug-induced polyneuropathy: Secondary | ICD-10-CM | POA: Diagnosis not present

## 2021-10-07 DIAGNOSIS — C7802 Secondary malignant neoplasm of left lung: Secondary | ICD-10-CM | POA: Diagnosis not present

## 2021-10-07 DIAGNOSIS — C7801 Secondary malignant neoplasm of right lung: Secondary | ICD-10-CM | POA: Diagnosis not present

## 2021-10-07 DIAGNOSIS — C7931 Secondary malignant neoplasm of brain: Secondary | ICD-10-CM | POA: Diagnosis not present

## 2021-10-07 DIAGNOSIS — C20 Malignant neoplasm of rectum: Secondary | ICD-10-CM | POA: Diagnosis not present

## 2021-10-07 DIAGNOSIS — R634 Abnormal weight loss: Secondary | ICD-10-CM | POA: Diagnosis not present

## 2021-10-07 NOTE — Telephone Encounter (Signed)
Dr Hinton Rao mentioned in supportive care meeting this morning, that the family had requested Dr Jeryl Columbia be the attending @ Hospice. Dr Jeryl Columbia had been seeing the pt in the home setting, and they wanted to continue that care if possible. Dr Hinton Rao is fine with Dr Jeryl Columbia taking over as attending and is willing to help them if needed in the future. I received a call that was left on nurse line from Soledad Gerlach, Hospice nurse, asking to verify if Dr Hinton Rao was planning to do symptom management.  I attempted call back to [facility] but she was on the line. I LVM on Melissa,RN's identified answering machine of above conversation during supportive care meeting. Dr Jeryl Columbia will be taking over everything from our understanding. I asked her to call me back if she needed anything else.

## 2021-10-09 DIAGNOSIS — C7801 Secondary malignant neoplasm of right lung: Secondary | ICD-10-CM | POA: Diagnosis not present

## 2021-10-09 DIAGNOSIS — C7802 Secondary malignant neoplasm of left lung: Secondary | ICD-10-CM | POA: Diagnosis not present

## 2021-10-09 DIAGNOSIS — R634 Abnormal weight loss: Secondary | ICD-10-CM | POA: Diagnosis not present

## 2021-10-09 DIAGNOSIS — C7931 Secondary malignant neoplasm of brain: Secondary | ICD-10-CM | POA: Diagnosis not present

## 2021-10-09 DIAGNOSIS — C20 Malignant neoplasm of rectum: Secondary | ICD-10-CM | POA: Diagnosis not present

## 2021-10-09 DIAGNOSIS — G620 Drug-induced polyneuropathy: Secondary | ICD-10-CM | POA: Diagnosis not present

## 2021-10-10 DIAGNOSIS — R634 Abnormal weight loss: Secondary | ICD-10-CM | POA: Diagnosis not present

## 2021-10-10 DIAGNOSIS — G620 Drug-induced polyneuropathy: Secondary | ICD-10-CM | POA: Diagnosis not present

## 2021-10-10 DIAGNOSIS — C7802 Secondary malignant neoplasm of left lung: Secondary | ICD-10-CM | POA: Diagnosis not present

## 2021-10-10 DIAGNOSIS — C7801 Secondary malignant neoplasm of right lung: Secondary | ICD-10-CM | POA: Diagnosis not present

## 2021-10-10 DIAGNOSIS — C7931 Secondary malignant neoplasm of brain: Secondary | ICD-10-CM | POA: Diagnosis not present

## 2021-10-10 DIAGNOSIS — C20 Malignant neoplasm of rectum: Secondary | ICD-10-CM | POA: Diagnosis not present

## 2021-10-13 ENCOUNTER — Encounter: Payer: Self-pay | Admitting: Oncology

## 2021-10-13 DIAGNOSIS — R634 Abnormal weight loss: Secondary | ICD-10-CM | POA: Diagnosis not present

## 2021-10-13 DIAGNOSIS — C20 Malignant neoplasm of rectum: Secondary | ICD-10-CM | POA: Diagnosis not present

## 2021-10-13 DIAGNOSIS — G620 Drug-induced polyneuropathy: Secondary | ICD-10-CM | POA: Diagnosis not present

## 2021-10-13 DIAGNOSIS — C7801 Secondary malignant neoplasm of right lung: Secondary | ICD-10-CM | POA: Diagnosis not present

## 2021-10-13 DIAGNOSIS — C7802 Secondary malignant neoplasm of left lung: Secondary | ICD-10-CM | POA: Diagnosis not present

## 2021-10-13 DIAGNOSIS — C7931 Secondary malignant neoplasm of brain: Secondary | ICD-10-CM | POA: Diagnosis not present

## 2021-10-14 DIAGNOSIS — G620 Drug-induced polyneuropathy: Secondary | ICD-10-CM | POA: Diagnosis not present

## 2021-10-14 DIAGNOSIS — C7931 Secondary malignant neoplasm of brain: Secondary | ICD-10-CM | POA: Diagnosis not present

## 2021-10-14 DIAGNOSIS — C7802 Secondary malignant neoplasm of left lung: Secondary | ICD-10-CM | POA: Diagnosis not present

## 2021-10-14 DIAGNOSIS — R634 Abnormal weight loss: Secondary | ICD-10-CM | POA: Diagnosis not present

## 2021-10-14 DIAGNOSIS — C7801 Secondary malignant neoplasm of right lung: Secondary | ICD-10-CM | POA: Diagnosis not present

## 2021-10-14 DIAGNOSIS — C20 Malignant neoplasm of rectum: Secondary | ICD-10-CM | POA: Diagnosis not present

## 2021-10-23 DEATH — deceased

## 2021-12-03 ENCOUNTER — Other Ambulatory Visit: Payer: Self-pay | Admitting: Cardiology
